# Patient Record
Sex: Female | Born: 1945 | Race: White | Hispanic: No | Smoking: Never smoker
Health system: Southern US, Community
[De-identification: ages and names within clinical notes are randomized; demographics above are authoritative.]

## PROBLEM LIST (undated history)

## (undated) DIAGNOSIS — N189 Chronic kidney disease, unspecified: Secondary | ICD-10-CM

## (undated) DIAGNOSIS — M1990 Unspecified osteoarthritis, unspecified site: Secondary | ICD-10-CM

## (undated) DIAGNOSIS — I10 Essential (primary) hypertension: Secondary | ICD-10-CM

## (undated) DIAGNOSIS — E785 Hyperlipidemia, unspecified: Secondary | ICD-10-CM

## (undated) DIAGNOSIS — H34232 Retinal artery branch occlusion, left eye: Secondary | ICD-10-CM

## (undated) DIAGNOSIS — R5383 Other fatigue: Secondary | ICD-10-CM

## (undated) DIAGNOSIS — J189 Pneumonia, unspecified organism: Secondary | ICD-10-CM

## (undated) DIAGNOSIS — E279 Disorder of adrenal gland, unspecified: Secondary | ICD-10-CM

## (undated) DIAGNOSIS — E875 Hyperkalemia: Secondary | ICD-10-CM

## (undated) DIAGNOSIS — I209 Angina pectoris, unspecified: Secondary | ICD-10-CM

## (undated) DIAGNOSIS — R7303 Prediabetes: Secondary | ICD-10-CM

## (undated) DIAGNOSIS — K4130 Unilateral femoral hernia, with obstruction, without gangrene, not specified as recurrent: Secondary | ICD-10-CM

## (undated) HISTORY — DX: Unilateral femoral hernia, with obstruction, without gangrene, not specified as recurrent: K41.30

## (undated) HISTORY — DX: Hyperlipidemia, unspecified: E78.5

---

## 1999-11-06 ENCOUNTER — Encounter: Admission: RE | Admit: 1999-11-06 | Discharge: 1999-11-06 | Payer: Self-pay | Admitting: Family Medicine

## 1999-11-06 ENCOUNTER — Encounter: Payer: Self-pay | Admitting: Family Medicine

## 2000-12-21 ENCOUNTER — Encounter: Admission: RE | Admit: 2000-12-21 | Discharge: 2000-12-21 | Payer: Self-pay | Admitting: Family Medicine

## 2000-12-21 ENCOUNTER — Encounter: Payer: Self-pay | Admitting: Family Medicine

## 2004-09-18 ENCOUNTER — Encounter: Admission: RE | Admit: 2004-09-18 | Discharge: 2004-09-18 | Payer: Self-pay | Admitting: Unknown Physician Specialty

## 2006-06-09 ENCOUNTER — Other Ambulatory Visit: Admission: RE | Admit: 2006-06-09 | Discharge: 2006-06-09 | Payer: Self-pay | Admitting: Family Medicine

## 2011-03-08 ENCOUNTER — Other Ambulatory Visit: Payer: Self-pay | Admitting: Family Medicine

## 2011-03-08 ENCOUNTER — Other Ambulatory Visit (HOSPITAL_COMMUNITY)
Admission: RE | Admit: 2011-03-08 | Discharge: 2011-03-08 | Disposition: A | Discharge status: Home / Self Care | Payer: BC Managed Care – PPO | Source: Ambulatory Visit | Attending: Family Medicine | Admitting: Family Medicine

## 2011-03-08 DIAGNOSIS — Z01419 Encounter for gynecological examination (general) (routine) without abnormal findings: Secondary | ICD-10-CM | POA: Insufficient documentation

## 2011-03-30 ENCOUNTER — Other Ambulatory Visit: Payer: Self-pay | Admitting: Family Medicine

## 2011-03-30 DIAGNOSIS — Z1231 Encounter for screening mammogram for malignant neoplasm of breast: Secondary | ICD-10-CM

## 2011-06-30 ENCOUNTER — Ambulatory Visit: Payer: Self-pay

## 2016-01-01 ENCOUNTER — Observation Stay
Admission: EM | Admit: 2016-01-01 | Discharge: 2016-01-02 | Disposition: A | Discharge status: Home / Self Care | Payer: Medicare Other | Attending: Internal Medicine | Admitting: Internal Medicine

## 2016-01-01 ENCOUNTER — Encounter: Payer: Self-pay | Admitting: *Deleted

## 2016-01-01 ENCOUNTER — Emergency Department: Payer: Medicare Other

## 2016-01-01 DIAGNOSIS — E871 Hypo-osmolality and hyponatremia: Secondary | ICD-10-CM | POA: Insufficient documentation

## 2016-01-01 DIAGNOSIS — Z7982 Long term (current) use of aspirin: Secondary | ICD-10-CM | POA: Insufficient documentation

## 2016-01-01 DIAGNOSIS — I10 Essential (primary) hypertension: Secondary | ICD-10-CM

## 2016-01-01 DIAGNOSIS — I200 Unstable angina: Secondary | ICD-10-CM

## 2016-01-01 DIAGNOSIS — R079 Chest pain, unspecified: Secondary | ICD-10-CM | POA: Diagnosis present

## 2016-01-01 DIAGNOSIS — R0789 Other chest pain: Principal | ICD-10-CM | POA: Diagnosis present

## 2016-01-01 DIAGNOSIS — Z8249 Family history of ischemic heart disease and other diseases of the circulatory system: Secondary | ICD-10-CM | POA: Insufficient documentation

## 2016-01-01 DIAGNOSIS — Z885 Allergy status to narcotic agent status: Secondary | ICD-10-CM | POA: Insufficient documentation

## 2016-01-01 DIAGNOSIS — I4510 Unspecified right bundle-branch block: Secondary | ICD-10-CM | POA: Insufficient documentation

## 2016-01-01 DIAGNOSIS — E782 Mixed hyperlipidemia: Secondary | ICD-10-CM | POA: Insufficient documentation

## 2016-01-01 DIAGNOSIS — R7989 Other specified abnormal findings of blood chemistry: Secondary | ICD-10-CM | POA: Diagnosis not present

## 2016-01-01 DIAGNOSIS — Z9119 Patient's noncompliance with other medical treatment and regimen: Secondary | ICD-10-CM | POA: Diagnosis not present

## 2016-01-01 HISTORY — DX: Essential (primary) hypertension: I10

## 2016-01-01 LAB — BASIC METABOLIC PANEL
Anion gap: 12 (ref 5–15)
BUN: 13 mg/dL (ref 6–20)
CALCIUM: 8.9 mg/dL (ref 8.9–10.3)
CO2: 21 mmol/L — AB (ref 22–32)
CREATININE: 0.82 mg/dL (ref 0.44–1.00)
Chloride: 101 mmol/L (ref 101–111)
GFR calc Af Amer: >60 mL/min (ref >60)
GFR calc non Af Amer: >60 mL/min (ref >60)
GLUCOSE: 159 mg/dL — AB (ref 65–99)
Potassium: 3.7 mmol/L (ref 3.5–5.1)
Sodium: 134 mmol/L — ABNORMAL LOW (ref 135–145)

## 2016-01-01 LAB — TROPONIN I
Troponin I: 0.43 ng/mL (ref <0.03)
Troponin I: 0.98 ng/mL (ref <0.03)

## 2016-01-01 LAB — CBC
HCT: 48.2 % — ABNORMAL HIGH (ref 35.0–47.0)
HEMOGLOBIN: 15.9 g/dL (ref 12.0–16.0)
MCH: 29.1 pg (ref 26.0–34.0)
MCHC: 33.1 g/dL (ref 32.0–36.0)
MCV: 88 fL (ref 80.0–100.0)
Platelets: 241 10*3/uL (ref 150–440)
RBC: 5.48 MIL/uL — ABNORMAL HIGH (ref 3.80–5.20)
RDW: 13.8 % (ref 11.5–14.5)
WBC: 7.5 10*3/uL (ref 3.6–11.0)

## 2016-01-01 LAB — TSH: TSH: 2.655 u[IU]/mL (ref 0.350–4.500)

## 2016-01-01 MED ORDER — ENOXAPARIN SODIUM 100 MG/ML ~~LOC~~ SOLN
1.0000 mg/kg | Freq: Two times a day (BID) | SUBCUTANEOUS | Status: DC
  Administered 2016-01-01 – 2016-01-02 (×3): 85 mg via SUBCUTANEOUS
  Filled 2016-01-01 (×3): qty 1

## 2016-01-01 MED ORDER — ASPIRIN EC 325 MG PO TBEC
325.0000 mg | DELAYED_RELEASE_TABLET | Freq: Every day | ORAL | Status: DC
  Administered 2016-01-02: 325 mg via ORAL
  Filled 2016-01-01 (×2): qty 1

## 2016-01-01 MED ORDER — NITROGLYCERIN 0.4 MG SL SUBL
0.4000 mg | SUBLINGUAL_TABLET | SUBLINGUAL | Status: DC | PRN
  Administered 2016-01-01: 0.4 mg via SUBLINGUAL
  Filled 2016-01-01: qty 1

## 2016-01-01 MED ORDER — MORPHINE SULFATE (PF) 4 MG/ML IV SOLN: 1.0000 mg | INTRAVENOUS | Status: DC | PRN

## 2016-01-01 MED ORDER — METOPROLOL TARTRATE 25 MG PO TABS
25.0000 mg | ORAL_TABLET | Freq: Two times a day (BID) | ORAL | Status: DC
  Administered 2016-01-01 – 2016-01-02 (×3): 25 mg via ORAL
  Filled 2016-01-01 (×3): qty 1

## 2016-01-01 MED ORDER — CARVEDILOL 6.25 MG PO TABS
3.1250 mg | ORAL_TABLET | Freq: Two times a day (BID) | ORAL | Status: DC
  Administered 2016-01-01 – 2016-01-02 (×2): 3.125 mg via ORAL
  Filled 2016-01-01 (×2): qty 1

## 2016-01-01 MED ORDER — ONDANSETRON HCL 4 MG/2ML IJ SOLN: 4.0000 mg | Freq: Four times a day (QID) | INTRAMUSCULAR | Status: DC | PRN

## 2016-01-01 MED ORDER — SODIUM CHLORIDE 0.9% FLUSH
3.0000 mL | Freq: Two times a day (BID) | INTRAVENOUS | Status: DC
  Administered 2016-01-01: 3 mL via INTRAVENOUS

## 2016-01-01 MED ORDER — LATANOPROST 0.005 % OP SOLN
1.0000 [drp] | Freq: Every day | OPHTHALMIC | Status: DC
  Administered 2016-01-01: 1 [drp] via OPHTHALMIC
  Filled 2016-01-01: qty 2.5

## 2016-01-01 MED ORDER — SODIUM CHLORIDE 0.9 % IV SOLN
INTRAVENOUS | Status: DC
  Administered 2016-01-01 – 2016-01-02 (×2): via INTRAVENOUS

## 2016-01-01 MED ORDER — ONDANSETRON HCL 4 MG PO TABS: 4.0000 mg | ORAL_TABLET | Freq: Four times a day (QID) | ORAL | Status: DC | PRN

## 2016-01-01 MED ORDER — SENNOSIDES-DOCUSATE SODIUM 8.6-50 MG PO TABS: 1.0000 | ORAL_TABLET | Freq: Every evening | ORAL | Status: DC | PRN

## 2016-01-01 MED ORDER — ACETAMINOPHEN 650 MG RE SUPP: 650.0000 mg | Freq: Four times a day (QID) | RECTAL | Status: DC | PRN

## 2016-01-01 MED ORDER — ACETAMINOPHEN 325 MG PO TABS: 650.0000 mg | ORAL_TABLET | Freq: Four times a day (QID) | ORAL | Status: DC | PRN

## 2016-01-01 MED ORDER — HYDRALAZINE HCL 20 MG/ML IJ SOLN: 10.0000 mg | Freq: Four times a day (QID) | INTRAMUSCULAR | Status: DC | PRN

## 2016-01-01 MED ORDER — HYDROCODONE-ACETAMINOPHEN 5-325 MG PO TABS: 1.0000 | ORAL_TABLET | ORAL | Status: DC | PRN

## 2016-01-01 MED ORDER — TIMOLOL MALEATE 0.5 % OP SOLN
1.0000 [drp] | Freq: Every day | OPHTHALMIC | Status: DC
  Administered 2016-01-02: 1 [drp] via OPHTHALMIC
  Filled 2016-01-01: qty 5

## 2016-01-01 MED ORDER — NITROGLYCERIN 2 % TD OINT
0.5000 [in_us] | TOPICAL_OINTMENT | Freq: Four times a day (QID) | TRANSDERMAL | Status: DC
  Administered 2016-01-01 – 2016-01-02 (×4): 0.5 [in_us] via TOPICAL
  Filled 2016-01-01 (×4): qty 1

## 2016-01-01 NOTE — H&P (Signed)
[facility] - [place] at [facility]   PATIENT NAME: Joanne CourserVirginia Lewis    MR#:  [id number]  DATE OF BIRTH:  03/31/1945  DATE OF ADMISSION:  01/01/2016  PRIMARY CARE PHYSICIAN: No primary care provider on file.   REQUESTING/REFERRING PHYSICIAN: Dr. Don PerkingVeronese  CHIEF COMPLAINT:   Chest tightness HISTORY OF PRESENT ILLNESS:  Joanne CourserVirginia Lewis  is a 70 y.o. female with a known history of Hypertension not taking medications for several years due to feeling fatigued on metoprolol who presents with above complaint. Patient reports over the past 2-3 days she has had episodes of chest tightness associated with heaviness in her arms and legs and shortness of breath. She had mild chest pressure this morning which brought her to the ER due to concern of coronary artery disease. Her father passed away at age 69 with a massive MI. She took an aspirin before coming to the emergency room.  PAST MEDICAL HISTORY:   Past Medical History:  Diagnosis Date  . Hypertension     PAST SURGICAL HISTORY:  None  SOCIAL HISTORY:   Social History  Substance Use Topics  . Smoking status: Never Smoker  . Smokeless tobacco: Never Used  . Alcohol use No    FAMILY HISTORY:  Father passed away with MI at age 69  DRUG ALLERGIES:   Allergies  Allergen Reactions  . Codeine Nausea And Vomiting    REVIEW OF SYSTEMS:   Review of Systems  Constitutional: Negative.  Negative for chills, fever and malaise/fatigue.  HENT: Negative.  Negative for ear discharge, ear pain, hearing loss, nosebleeds and sore throat.   Eyes: Negative.  Negative for blurred vision and pain.  Respiratory: Negative.  Negative for cough, hemoptysis, shortness of breath and wheezing.   Cardiovascular: Negative.  Negative for chest pain, palpitations and leg swelling.       Chest/upper neck tightness  Gastrointestinal: Negative.  Negative for abdominal pain, blood in stool, diarrhea, nausea and vomiting.  Genitourinary:  Negative.  Negative for dysuria.  Musculoskeletal: Negative.  Negative for back pain.  Skin: Negative.   Neurological: Negative for dizziness, tremors, speech change, focal weakness, seizures and headaches.  Endo/Heme/Allergies: Negative.  Does not bruise/bleed easily.  Psychiatric/Behavioral: Negative.  Negative for depression, hallucinations and suicidal ideas.    MEDICATIONS AT HOME:   Prior to Admission medications   Medication Sig Start Date End Date Taking? Authorizing Provider  latanoprost (XALATAN) 0.005 % ophthalmic solution Place 1 drop into both eyes at bedtime.   Yes Historical Provider, MD  timolol (BETIMOL) 0.5 % ophthalmic solution Place 1 drop into both eyes daily.   Yes Historical Provider, MD      VITAL SIGNS:  Blood pressure (!) 152/99, pulse 97, temperature 97.6 F (36.4 C), temperature source Oral, resp. rate 18, height 5\' 4"  (1.626 m), weight 84.4 kg (186 lb), SpO2 96 %.  PHYSICAL EXAMINATION:   Physical Exam  Constitutional: She is oriented to person, place, and time and well-developed, well-nourished, and in no distress. No distress.  HENT:  Head: Normocephalic.  Eyes: No scleral icterus.  Neck: Normal range of motion. Neck supple. No JVD present. No tracheal deviation present.  Cardiovascular: Normal rate, regular rhythm and normal heart sounds.  Exam reveals no gallop and no friction rub.   No murmur heard. Pulmonary/Chest: Effort normal and breath sounds normal. No respiratory distress. She has no wheezes. She has no rales. She exhibits no tenderness.  Abdominal: Soft. Bowel sounds are normal. She exhibits no distension and  no mass. There is no tenderness. There is no rebound and no guarding.  Musculoskeletal: Normal range of motion. She exhibits no edema.  Neurological: She is alert and oriented to person, place, and time.  Skin: Skin is warm. No rash noted. No erythema.  Psychiatric: Affect and judgment normal.      LABORATORY PANEL:    CBC  Recent Labs Lab 01/01/16 1141  WBC 7.5  HGB 15.9  HCT 48.2*  PLT 241   ------------------------------------------------------------------------------------------------------------------  Chemistries   Recent Labs Lab 01/01/16 1141  NA 134*  K 3.7  CL 101  CO2 21*  GLUCOSE 159*  BUN 13  CREATININE 0.82  CALCIUM 8.9   ------------------------------------------------------------------------------------------------------------------  Cardiac Enzymes  Recent Labs Lab 01/01/16 1141  TROPONINI <0.03   ------------------------------------------------------------------------------------------------------------------  RADIOLOGY:  Dg Chest 2 View  Result Date: 01/01/2016 CLINICAL DATA:  Chest tightness.  Hypertension EXAM: CHEST  2 VIEW COMPARISON:  None. FINDINGS: Lungs are clear. Heart size and pulmonary vascularity are normal. No adenopathy. No pneumothorax. No bone lesions. IMPRESSION: No edema or consolidation. Electronically Signed   By: Bretta BangWilliam  Woodruff III M.D.   On: 01/01/2016 12:02    EKG:   Sinus tachycardia without ST elevation or depression  IMPRESSION AND PLAN:   70 year old female with a history of hypertension not compliant with hypertensive medications who presents with chest tightness.  1. Chest tightness: Patient will be evaluated for ACS.  Continue monitoring troponins Continue telemetry If troponins 3 are negative then patient may proceed to treadmill stress test in a.m. Start aspirin and Coreg (which she may be able to tolerate better)  2. Accelerated essential hypertension due to noncompliance: Start Coreg and monitor blood pressure. Dosage of Coreg will likely need to be increased.  3. Hyperglycemia: Check hemoglobin A1c  4. Mild hyponatremia: Check TSH and start IV fluids with repeat BMP in a.m.     All the records are reviewed and case discussed with ED provider. Management plans discussed with the patient and she is in  agreement  CODE STATUS: FULL  TOTAL TIME TAKING CARE OF THIS PATIENT: 45 minutes.    Katrine Radich M.D on 01/01/2016 at 1:18 PM  Between 7am to 6pm - Pager - [phone]  After 6pm go to [email or web address] - Social research officer, governmentpassword EPAS [email or web address]  [facility]  Office  [phone]  CC: Primary care physician; No primary care provider on file.

## 2016-01-01 NOTE — ED Notes (Signed)
Patient left for xray.

## 2016-01-01 NOTE — Progress Notes (Signed)
ANTICOAGULATION CONSULT NOTE - Initial Consult  Pharmacy Consult for enoxaparin Indication: chest pain/ACS  Allergies  Allergen Reactions  . Codeine Nausea And Vomiting   Patient Measurements: Height: 5\' 4"  (162.6 cm) Weight: 186 lb (84.4 kg) IBW/kg (Calculated) : 54.7  Vital Signs: Temp: 97.6 F (36.4 C) (12/21 1135) Temp Source: Oral (12/21 1135) BP: 152/99 (12/21 1250) Pulse Rate: 97 (12/21 1250)  Labs:  Recent Labs  01/01/16 1141  HGB 15.9  HCT 48.2*  PLT 241  CREATININE 0.82  TROPONINI <0.03    Estimated Creatinine Clearance: 68.1 mL/min (by C-G formula based on SCr of 0.82 mg/dL).   Medical History: Past Medical History:  Diagnosis Date  . Hypertension    Assessment: Pharmacy consulted to dose enoxaparin treatment dosing in this 70 year old female for ACS.   Patient was not taking any anticoagulants prior to admission.  Goal of Therapy:  Monitor platelets by anticoagulation protocol: Yes   Plan:  Enoxaparin 1 mg/kg subcutaneously q12h  Cindi CarbonMary M Ahsha Hinsley, PharmD, BCPS Clinical Pharmacist 01/01/2016,1:22 PM

## 2016-01-01 NOTE — ED Triage Notes (Signed)
Per EMS report, Patient c/o throat/chest tightness today, feeling arms were heavy and pain in the Left leg. Patient has history of HTN and is non-compliant with meds. Patient states she became anxious and then became short of breath.

## 2016-01-01 NOTE — ED Provider Notes (Addendum)
[facility] Emergency Department Provider Note  ____________________________________________  Time seen: Approximately 12:23 PM  I have reviewed the triage vital signs and the nursing notes.   HISTORY  Chief Complaint Chest Pain   HPI Joanne Lewis is a 70 y.o. female with a history of hypertension and medication noncompliance who presents for evaluation of chest pain. Patient reports that she has been noncompliant with her metoprolol for 5 years and hasn't seen a physician for 5 years. For the last 2-3 days she reports episodes of chest tightness associated with heaviness of her arms and legs and mild shortness of breath. She denies nausea or vomiting, diaphoresis. She does have family history of ischemic heart disease and reports that her father died of a fulminant heart attack in his 7060s. Patient does not have a history of smoking. She reports the episode today was more pronounced which made her come into the emergency room. Her chest pain happens both at rest and with minimal exertion. At this time she denies mild chest tightness. No fever or cough. Patient took full dose of ASA before coming in  Past Medical History:  Diagnosis Date  . Hypertension     There are no active problems to display for this patient.   History reviewed. No pertinent surgical history.  Prior to Admission medications   Not on File    Allergies Codeine  No family history on file.  Social History Social History  Substance Use Topics  . Smoking status: Never Smoker  . Smokeless tobacco: Never Used  . Alcohol use No    Review of Systems  Constitutional: Negative for fever. Eyes: Negative for visual changes. ENT: Negative for sore throat. Neck: No neck pain  Cardiovascular: + chest pain. Respiratory: + shortness of breath. Gastrointestinal: Negative for abdominal pain, vomiting or diarrhea. Genitourinary: Negative for dysuria. Musculoskeletal: Negative for  back pain. Skin: Negative for rash. Neurological: Negative for headaches, weakness or numbness. Psych: No SI or HI  ____________________________________________   PHYSICAL EXAM:  VITAL SIGNS: ED Triage Vitals  Enc Vitals Group     BP 01/01/16 1135 (!) 203/110     Pulse Rate 01/01/16 1135 (!) 105     Resp 01/01/16 1135 14     Temp 01/01/16 1135 97.6 F (36.4 C)     Temp Source 01/01/16 1135 Oral     SpO2 01/01/16 1135 97 %     Weight 01/01/16 1137 186 lb (84.4 kg)     Height 01/01/16 1137 5\' 4"  (1.626 m)     Head Circumference --      Peak Flow --      Pain Score --      Pain Loc --      Pain Edu? --      Excl. in GC? --     Constitutional: Alert and oriented. Well appearing and in no apparent distress. HEENT:      Head: Normocephalic and atraumatic.         Eyes: Conjunctivae are normal. Sclera is non-icteric. EOMI. PERRL      Mouth/Throat: Mucous membranes are moist.       Neck: Supple with no signs of meningismus. Cardiovascular: Tachycardic with regular rhythm. No murmurs, gallops, or rubs. 2+ symmetrical distal pulses are present in all extremities. No JVD. Respiratory: Normal respiratory effort. Lungs are clear to auscultation bilaterally. No wheezes, crackles, or rhonchi.  Gastrointestinal: Soft, non tender, and non distended with positive bowel sounds. No rebound or guarding.  Musculoskeletal: Nontender with normal range of motion in all extremities. No edema, cyanosis, or erythema of extremities. Neurologic: Normal speech and language. Face is symmetric. Moving all extremities. No gross focal neurologic deficits are appreciated. Skin: Skin is warm, dry and intact. No rash noted. Psychiatric: Mood and affect are normal. Speech and behavior are normal.  ____________________________________________   LABS (all labs ordered are listed, but only abnormal results are displayed)  Labs Reviewed  BASIC METABOLIC PANEL - Abnormal; Notable for the following:        Result Value   Sodium 134 (*)    CO2 21 (*)    Glucose, Bld 159 (*)    All other components within normal limits  CBC - Abnormal; Notable for the following:    RBC 5.48 (*)    HCT 48.2 (*)    All other components within normal limits  TROPONIN I   ____________________________________________  EKG  ED ECG REPORT I, Nita Sicklearolina Ashlyne Olenick, the attending physician, personally viewed and interpreted this ECG.  Sinus tachycardia, rate of 110, right bundle branch block, prolonged QTC, normal axis, no ST elevations or depressions.  ____________________________________________  RADIOLOGY   CXR: Negative ____________________________________________   PROCEDURES  Procedure(s) performed: None Procedures Critical Care performed:  None ____________________________________________   INITIAL IMPRESSION / ASSESSMENT AND PLAN / ED COURSE   70 y.o. female with a history of hypertension and medication noncompliance who presents for evaluation of multiple episodes of chest tightness associated with shortness of breath both at rest and with exertion over the course of the last 3 days. Patient with extremely elevated blood pressure with systolics in the low 200s. She received one sublingual nitroglycerin and her blood pressure down to the 150s with resolution of her chest pain. Her first troponin is negative. Her EKG shows a right bundle-branch block with no evidence of ischemia with no old for comparison. I will re-start patient on metoprolol and admit for further management.  Clinical Course     Pertinent labs & imaging results that were available during my care of the patient were reviewed by me and considered in my medical decision making (see chart for details).    ____________________________________________   FINAL CLINICAL IMPRESSION(S) / ED DIAGNOSES  Final diagnoses:  Unstable angina (HCC)  Malignant hypertension      NEW MEDICATIONS STARTED DURING THIS VISIT:  New  Prescriptions   No medications on file     Note:  This document was prepared using Dragon voice recognition software and may include unintentional dictation errors.    Nita Sicklearolina Saleema Weppler, MD 01/01/16 1254    Nita Sicklearolina Terriana Barreras, MD 01/01/16 1256

## 2016-01-02 ENCOUNTER — Emergency Department: Payer: Medicare Other

## 2016-01-02 DIAGNOSIS — R0789 Other chest pain: Secondary | ICD-10-CM | POA: Diagnosis not present

## 2016-01-02 LAB — BASIC METABOLIC PANEL
ANION GAP: 8 (ref 5–15)
BUN: 13 mg/dL (ref 6–20)
CALCIUM: 8.3 mg/dL — AB (ref 8.9–10.3)
CHLORIDE: 100 mmol/L — AB (ref 101–111)
CO2: 24 mmol/L (ref 22–32)
Creatinine, Ser: 0.65 mg/dL (ref 0.44–1.00)
GFR calc non Af Amer: >60 mL/min (ref >60)
GLUCOSE: 106 mg/dL — AB (ref 65–99)
Potassium: 3.9 mmol/L (ref 3.5–5.1)
Sodium: 132 mmol/L — ABNORMAL LOW (ref 135–145)

## 2016-01-02 LAB — NM MYOCAR MULTI W/SPECT W/WALL MOTION / EF
CHL CUP MPHR: 151 {beats}/min
CHL CUP RESTING HR STRESS: 67 {beats}/min
CSEPED: 1 min
CSEPEDS: 1 s
CSEPEW: 1 METS
LV sys vol: 49 mL
LVDIAVOL: 88 mL (ref 46–106)
Peak HR: 116 {beats}/min
Percent HR: 76 %
SDS: 3
SRS: 5
SSS: 6
TID: 0.84

## 2016-01-02 LAB — HEMOGLOBIN A1C
Hgb A1c MFr Bld: 5.2 % (ref 4.8–5.6)
MEAN PLASMA GLUCOSE: 103 mg/dL

## 2016-01-02 LAB — CBC
HEMATOCRIT: 41.6 % (ref 35.0–47.0)
HEMOGLOBIN: 14.2 g/dL (ref 12.0–16.0)
MCH: 29.6 pg (ref 26.0–34.0)
MCHC: 34.1 g/dL (ref 32.0–36.0)
MCV: 87 fL (ref 80.0–100.0)
Platelets: 197 10*3/uL (ref 150–440)
RBC: 4.78 MIL/uL (ref 3.80–5.20)
RDW: 13.3 % (ref 11.5–14.5)
WBC: 8 10*3/uL (ref 3.6–11.0)

## 2016-01-02 LAB — LIPID PANEL
CHOL/HDL RATIO: 3.1 ratio
CHOLESTEROL: 174 mg/dL (ref 0–200)
HDL: 56 mg/dL (ref >40)
LDL Cholesterol: 106 mg/dL — ABNORMAL HIGH (ref 0–99)
TRIGLYCERIDES: 60 mg/dL (ref <150)
VLDL: 12 mg/dL (ref 0–40)

## 2016-01-02 LAB — GLUCOSE, CAPILLARY: Glucose-Capillary: 99 mg/dL (ref 65–99)

## 2016-01-02 LAB — TROPONIN I: Troponin I: 0.61 ng/mL (ref <0.03)

## 2016-01-02 MED ORDER — ATORVASTATIN CALCIUM 20 MG PO TABS: 40.0000 mg | ORAL_TABLET | Freq: Every day | ORAL | Status: DC

## 2016-01-02 MED ORDER — NITROGLYCERIN 0.4 MG SL SUBL: 0.4000 mg | SUBLINGUAL_TABLET | SUBLINGUAL | 0 refills | Status: DC | PRN

## 2016-01-02 MED ORDER — TECHNETIUM TC 99M TETROFOSMIN IV KIT
30.0000 | PACK | Freq: Once | INTRAVENOUS | Status: AC | PRN
  Administered 2016-01-02: 29.854 via INTRAVENOUS

## 2016-01-02 MED ORDER — TECHNETIUM TC 99M TETROFOSMIN IV KIT
13.0000 | PACK | Freq: Once | INTRAVENOUS | Status: AC | PRN
  Administered 2016-01-02: 13.58 via INTRAVENOUS

## 2016-01-02 MED ORDER — ASPIRIN 325 MG PO TBEC: 325.0000 mg | DELAYED_RELEASE_TABLET | Freq: Every day | ORAL | 2 refills | Status: DC

## 2016-01-02 MED ORDER — CARVEDILOL 3.125 MG PO TABS: 3.1250 mg | ORAL_TABLET | Freq: Two times a day (BID) | ORAL | 2 refills | Status: DC

## 2016-01-02 MED ORDER — REGADENOSON 0.4 MG/5ML IV SOLN
0.4000 mg | Freq: Once | INTRAVENOUS | Status: AC
  Administered 2016-01-02: 0.4 mg via INTRAVENOUS

## 2016-01-02 MED ORDER — ATORVASTATIN CALCIUM 40 MG PO TABS: 40.0000 mg | ORAL_TABLET | Freq: Every day | ORAL | 2 refills | Status: DC

## 2016-01-02 NOTE — Discharge Summary (Addendum)
[facility] - [place] at [facility]   PATIENT NAME: Joanne Lewis    MR#:  [id number]  DATE OF BIRTH:  06/24/1945  DATE OF ADMISSION:  01/01/2016   ADMITTING PHYSICIAN: Adrian SaranSital Mody, MD  DATE OF DISCHARGE: 01/02/2016 PRIMARY CARE PHYSICIAN: No primary care provider on file.   ADMISSION DIAGNOSIS:  Malignant hypertension [I10] Unstable angina (HCC) [I20.0] Chest pain [R07.9] DISCHARGE DIAGNOSIS:  Active Problems:   Chest tightness elevated tropoinin. HLP SECONDARY DIAGNOSIS:   Past Medical History:  Diagnosis Date  . Hypertension    HOSPITAL COURSE:  70 year old female with a history of hypertension not compliant with hypertensive medications who presents with chest tightness.  1. Chest tightness with elevated troponin. She has been treated with ASA and lovenox. Normal stress test. Carvedilol and lipitor. Possible discharged home if patient ambulating well without any further significant symptoms with follow-up next week per Dr. Gwen PoundsKowalski.  2. Accelerated essential hypertension due to noncompliance: BP is controlled. Continue Coreg per Dr. Gwen PoundsKowalski.  3. Hyperlipidemia. Start lipitor.  4. Mild hyponatremia:  normal TSH.  I discussed with Dr. Gwen PoundsKowalski. DISCHARGE CONDITIONS:  Stable, discharge to home today. CONSULTS OBTAINED:  Treatment Team:  Lamar BlinksBruce J Kowalski, MD DRUG ALLERGIES:   Allergies  Allergen Reactions  . Codeine Nausea And Vomiting   DISCHARGE MEDICATIONS:   Allergies as of 01/02/2016      Reactions   Codeine Nausea And Vomiting      Medication List    TAKE these medications   aspirin 325 MG EC tablet Take 1 tablet (325 mg total) by mouth daily. Start taking on:  01/03/2016   atorvastatin 40 MG tablet Commonly known as:  LIPITOR Take 1 tablet (40 mg total) by mouth daily at 6 PM.   carvedilol 3.125 MG tablet Commonly known as:  COREG Take 1 tablet (3.125 mg total) by mouth 2 (two) times daily with a meal.     latanoprost 0.005 % ophthalmic solution Commonly known as:  XALATAN Place 1 drop into both eyes at bedtime.   nitroGLYCERIN 0.4 MG SL tablet Commonly known as:  NITROSTAT Place 1 tablet (0.4 mg total) under the tongue every 5 (five) minutes as needed for chest pain.   timolol 0.5 % ophthalmic solution Commonly known as:  BETIMOL Place 1 drop into both eyes daily.        DISCHARGE INSTRUCTIONS:  See AVS.  If you experience worsening of your admission symptoms, develop shortness of breath, life threatening emergency, suicidal or homicidal thoughts you must seek medical attention immediately by calling 911 or calling your MD immediately  if symptoms less severe.  You Must read complete instructions/literature along with all the possible adverse reactions/side effects for all the Medicines you take and that have been prescribed to you. Take any new Medicines after you have completely understood and accpet all the possible adverse reactions/side effects.   Please note  You were cared for by a hospitalist during your hospital stay. If you have any questions about your discharge medications or the care you received while you were in the hospital after you are discharged, you can call the unit and asked to speak with the hospitalist on call if the hospitalist that took care of you is not available. Once you are discharged, your primary care physician will handle any further medical issues. Please note that NO REFILLS for any discharge medications will be authorized once you are discharged, as it is imperative that you return to your primary care  physician (or establish a relationship with a primary care physician if you do not have one) for your aftercare needs so that they can reassess your need for medications and monitor your lab values.    On the day of Discharge:  VITAL SIGNS:  Blood pressure (!) 147/73, pulse 61, temperature 97.9 F (36.6 C), temperature source Oral, resp. rate 18,  height 5\' 4"  (1.626 m), weight 185 lb (83.9 kg), SpO2 97 %. PHYSICAL EXAMINATION:  GENERAL:  70 y.o.-year-old patient lying in the bed with no acute distress.  EYES: Pupils equal, round, reactive to light and accommodation. No scleral icterus. Extraocular muscles intact.  HEENT: Head atraumatic, normocephalic. Oropharynx and nasopharynx clear.  NECK:  Supple, no jugular venous distention. No thyroid enlargement, no tenderness.  LUNGS: Normal breath sounds bilaterally, no wheezing, rales,rhonchi or crepitation. No use of accessory muscles of respiration.  CARDIOVASCULAR: S1, S2 normal. No murmurs, rubs, or gallops.  ABDOMEN: Soft, non-tender, non-distended. Bowel sounds present. No organomegaly or mass.  EXTREMITIES: No pedal edema, cyanosis, or clubbing.  NEUROLOGIC: Cranial nerves II through XII are intact. Muscle strength 5/5 in all extremities. Sensation intact. Gait not checked.  PSYCHIATRIC: The patient is alert and oriented x 3.  SKIN: No obvious rash, lesion, or ulcer.  DATA REVIEW:   CBC  Recent Labs Lab 01/02/16 0131  WBC 8.0  HGB 14.2  HCT 41.6  PLT 197    Chemistries   Recent Labs Lab 01/02/16 0131  NA 132*  K 3.9  CL 100*  CO2 24  GLUCOSE 106*  BUN 13  CREATININE 0.65  CALCIUM 8.3*     Microbiology Results  No results found for this or any previous visit.  RADIOLOGY:  Nm Myocar Multi W/spect W/wall Motion / Ef  Result Date: 01/02/2016  The study is normal.  This is a low risk study.  The left ventricular ejection fraction is normal (55-65%).  Blood pressure demonstrated a normal response to exercise.  There was no ST segment deviation noted during stress.      Management plans discussed with the patient, family and they are in agreement.  CODE STATUS:     Code Status Orders        Start     Ordered   01/01/16 1316  Full code  Continuous     01/01/16 1317    Code Status History    Date Active Date Inactive Code Status Order ID  Comments User Context   This patient has a current code status but no historical code status.      TOTAL TIME TAKING CARE OF THIS PATIENT: 36 minutes.    Shaune Pollackhen, Jenilee Franey M.D on 01/02/2016 at 4:15 PM  Between 7am to 6pm - Pager - [phone]  After 6pm go to [email or web address] - Scientist, research (life [email or web address])password EPAS [facility]  [facility] [place] [facility]  Office  [phone]  CC: Primary care physician; No primary care provider on file.   Note: This dictation was prepared with Dragon dictation along with smaller phrase technology. Any transcriptional errors that result from this process are unintentional.

## 2016-01-02 NOTE — Progress Notes (Signed)
ANTICOAGULATION CONSULT NOTE - Initial Consult  Pharmacy Consult for enoxaparin Indication: chest pain/ACS  Allergies  Allergen Reactions  . Codeine Nausea And Vomiting   Patient Measurements: Height: 5\' 4"  (162.6 cm) Weight: 185 lb (83.9 kg) IBW/kg (Calculated) : 54.7  Vital Signs: Temp: 98 F (36.7 C) (12/22 1116) Temp Source: Oral (12/22 1116) BP: 154/77 (12/22 1116) Pulse Rate: 76 (12/22 1116)  Labs:  Recent Labs  01/01/16 1141 01/01/16 1427 01/01/16 1917 01/02/16 0131  HGB 15.9  --   --  14.2  HCT 48.2*  --   --  41.6  PLT 241  --   --  197  CREATININE 0.82  --   --  0.65  TROPONINI <0.03 0.43* 0.98* 0.61*    Estimated Creatinine Clearance: 69.6 mL/min (by C-G formula based on SCr of 0.65 mg/dL).   Medical History: Past Medical History:  Diagnosis Date  . Hypertension    Assessment: Pharmacy consulted to dose enoxaparin treatment dosing in this 70 year old female for ACS.   Patient was not taking any anticoagulants prior to admission.  Goal of Therapy:  Monitor platelets by anticoagulation protocol: Yes   Plan:  Continue enoxaparin 1 mg/kg subcutaneously q12h  Martyn MalayBarefoot,Christyna Letendre C, PharmD, BCPS Clinical Pharmacist 01/02/2016,2:16 PM

## 2016-01-02 NOTE — Consult Note (Signed)
[facility] Consultation [facility]  Patient ID: Joanne PeerVirginia M Lewis, MRN: [id number], DOB/AGE: 69/09/1945 69 y.o. Admit date: 01/01/2016   Date of Consult: 01/02/2016 Primary Physician: No primary care provider on file. Primary Cardiologist: None  Chief Complaint:  Chief Complaint  Patient presents with  . Chest Pain   Reason for Consult: chest pain  HPI: 70 y.o. female with known essential hypertension and mixed hyperlipidemia who has been relatively noncompliant with her antihypertensive medication management. The patient has had some decrease exercise tolerance in recent months but recently has had some intermittent substernal chest discomfort radiating into her back associated with shortness of breath palpitations and fatigue. The patient had a significant recurrence of this and was seen in the emergency room. At that time the patient had an abnormal EKG showing normal sinus rhythm with left axis deviation and right bundle branch block. Additionally the patient had abnormal troponin of 0.98. Patient had resolution of her chest pain and underwent a stress test showing normal myocardial perfusion. The patient has had no further episodes of chest discomfort and currently is comfortable. There is concerns of cardiovascular disease but it appears based on information and current symptoms that the patient is stable for medical management  Past Medical History:  Diagnosis Date  . Hypertension       Surgical History: History reviewed. No pertinent surgical history.   Home Meds: Prior to Admission medications   Medication Sig Start Date End Date Taking? Authorizing Provider  latanoprost (XALATAN) 0.005 % ophthalmic solution Place 1 drop into both eyes at bedtime.   Yes Historical Provider, MD  timolol (BETIMOL) 0.5 % ophthalmic solution Place 1 drop into both eyes daily.   Yes Historical Provider, MD    Inpatient Medications:  . aspirin EC  325 mg Oral Daily  . carvedilol  3.125  mg Oral BID WC  . enoxaparin (LOVENOX) injection  1 mg/kg Subcutaneous BID  . latanoprost  1 drop Both Eyes QHS  . metoprolol tartrate  25 mg Oral BID  . nitroGLYCERIN  0.5 inch Topical Q6H  . sodium chloride flush  3 mL Intravenous Q12H  . timolol  1 drop Both Eyes Daily   . sodium chloride 75 mL/hr at 01/02/16 0600    Allergies:  Allergies  Allergen Reactions  . Codeine Nausea And Vomiting    Social History   Social History  . Marital status: Divorced    Spouse name: N/A  . Number of children: N/A  . Years of education: N/A   Occupational History  . Not on file.   Social History Main Topics  . Smoking status: Never Smoker  . Smokeless tobacco: Never Used  . Alcohol use No  . Drug use: No  . Sexual activity: Not on file   Other Topics Concern  . Not on file   Social History Narrative  . No narrative on file     No family history on file.   Review of Systems Positive for Chest pain shortness of breath Negative for: General:  chills, fever, night sweats or weight changes.  Cardiovascular: PND orthopnea syncope dizziness  Dermatological skin lesions rashes Respiratory: Cough congestion Urologic: Frequent urination urination at night and hematuria Abdominal: negative for nausea, vomiting, diarrhea, bright red blood per rectum, melena, or hematemesis Neurologic: negative for visual changes, and/or hearing changes  All other systems reviewed and are otherwise negative except as noted above.  Labs:  Recent Labs  01/01/16 1141 01/01/16 1427 01/01/16 1917 01/02/16 0131  TROPONINI <0.03 0.43* 0.98* 0.61*   Lab Results  Component Value Date   WBC 8.0 01/02/2016   HGB 14.2 01/02/2016   HCT 41.6 01/02/2016   MCV 87.0 01/02/2016   PLT 197 01/02/2016    Recent Labs Lab 01/02/16 0131  NA 132*  K 3.9  CL 100*  CO2 24  BUN 13  CREATININE 0.65  CALCIUM 8.3*  GLUCOSE 106*   Lab Results  Component Value Date   CHOL 174 01/02/2016   HDL 56  01/02/2016   LDLCALC 106 (H) 01/02/2016   TRIG 60 01/02/2016   No results found for: DDIMER  Radiology/Studies:  Dg Chest 2 View  Result Date: 01/01/2016 CLINICAL DATA:  Chest tightness.  Hypertension EXAM: CHEST  2 VIEW COMPARISON:  None. FINDINGS: Lungs are clear. Heart size and pulmonary vascularity are normal. No adenopathy. No pneumothorax. No bone lesions. IMPRESSION: No edema or consolidation. Electronically Signed   By: Bretta Bang III M.D.   On: 01/01/2016 12:02   Nm Myocar Multi W/spect W/wall Motion / Ef  Result Date: 01/02/2016  The study is normal.  This is a low risk study.  The left ventricular ejection fraction is normal (55-65%).  Blood pressure demonstrated a normal response to exercise.  There was no ST segment deviation noted during stress.     EKG: Normal sinus rhythm with left axis deviation and right bundle-branch block  Weights: Filed Weights   01/01/16 1137 01/01/16 1542  Weight: 84.4 kg (186 lb) 83.9 kg (185 lb)     Physical Exam: Blood pressure (!) 154/77, pulse 76, temperature 98 F (36.7 C), temperature source Oral, resp. rate 18, height 5\' 4"  (1.626 m), weight 83.9 kg (185 lb), SpO2 97 %. Body mass index is 31.76 kg/m. General: Well developed, well nourished, in no acute distress. Head eyes ears nose throat: Normocephalic, atraumatic, sclera non-icteric, no xanthomas, nares are without discharge. No apparent thyromegaly and/or mass  Lungs: Normal respiratory effort.  no wheezes, no rales, no rhonchi.  Heart: RRR with normal S1 S2. no murmur gallop, no rub, PMI is normal size and placement, carotid upstroke normal without bruit, jugular venous pressure is normal Abdomen: Soft, non-tender,  distended with normoactive bowel sounds. No hepatomegaly. No rebound/guarding. No obvious abdominal masses. Abdominal aorta is normal size without bruit Extremities: No edema. no cyanosis, no clubbing, no ulcers  Peripheral : 2+ bilateral upper extremity  pulses, 2+ bilateral femoral pulses, 2+ bilateral dorsal pedal pulse Neuro: Alert and oriented. No facial asymmetry. No focal deficit. Moves all extremities spontaneously. Musculoskeletal: Normal muscle tone without kyphosis Psych:  Responds to questions appropriately with a normal affect.    Assessment: 70 year old female with essential hypertension mixed hyperlipidemia having chest discomfort with abnormal EKG and minimal elevation of troponin but currently resolution of chest pain with a normal stress test needing further treatment  Plan: 1. Begin ambulation and follow for improvements of symptoms and no further assessment of the possibility of true angina requiring further cardiovascular intervention including the possibility of cardiac catheterization if symptoms do not resolve 2. Carvedilol for hypertension control and further risk reduction cardiovascular event 3. Aspirin for further risk reduction cardiovascular event 4. Abstain from metoprolol which caused patient significant symptoms and side effects 5. Isosorbide if necessary for chest discomfort 6. High intensity cholesterol therapy with atorvastatin due to significant family history in cardiovascular disease risk 7. Possible discharged home if patient ambulating well without any further significant symptoms with follow-up next week  Signed, Quentin Cornwall  Gwen PoundsKowalski M.D. [facility] 01/02/2016, 3:30 PM

## 2016-01-02 NOTE — Progress Notes (Signed)
Patient discharged per MD order and hospital protocol. Patient verbalized understanding of medications, discharge instructions, and follow up appointment. Patient was given her prescriptions for medications. Patient escorted off the unit via wheelchair.

## 2016-01-02 NOTE — Progress Notes (Signed)
Patient ambulated around the unit three times with nursing assistant. Patient's HR remained stable. Patient denied chest pain. Patient tolerated ambulation well.

## 2016-01-02 NOTE — Discharge Instructions (Signed)
Heart healthy diet

## 2016-01-10 ENCOUNTER — Encounter: Payer: Self-pay | Admitting: *Deleted

## 2016-01-10 ENCOUNTER — Emergency Department
Admission: EM | Admit: 2016-01-10 | Discharge: 2016-01-10 | Disposition: A | Discharge status: Home / Self Care | Payer: Medicare Other | Attending: Emergency Medicine | Admitting: Emergency Medicine

## 2016-01-10 DIAGNOSIS — I10 Essential (primary) hypertension: Secondary | ICD-10-CM | POA: Insufficient documentation

## 2016-01-10 DIAGNOSIS — R232 Flushing: Secondary | ICD-10-CM | POA: Insufficient documentation

## 2016-01-10 DIAGNOSIS — Z79899 Other long term (current) drug therapy: Secondary | ICD-10-CM | POA: Insufficient documentation

## 2016-01-10 DIAGNOSIS — R51 Headache: Secondary | ICD-10-CM | POA: Diagnosis present

## 2016-01-10 DIAGNOSIS — Z7982 Long term (current) use of aspirin: Secondary | ICD-10-CM | POA: Diagnosis not present

## 2016-01-10 DIAGNOSIS — R519 Headache, unspecified: Secondary | ICD-10-CM

## 2016-01-10 LAB — CBC
HCT: 44.4 % (ref 35.0–47.0)
Hemoglobin: 15.3 g/dL (ref 12.0–16.0)
MCH: 29.7 pg (ref 26.0–34.0)
MCHC: 34.4 g/dL (ref 32.0–36.0)
MCV: 86.6 fL (ref 80.0–100.0)
PLATELETS: 245 10*3/uL (ref 150–440)
RBC: 5.13 MIL/uL (ref 3.80–5.20)
RDW: 13.4 % (ref 11.5–14.5)
WBC: 8.1 10*3/uL (ref 3.6–11.0)

## 2016-01-10 LAB — BASIC METABOLIC PANEL
Anion gap: 8 (ref 5–15)
BUN: 17 mg/dL (ref 6–20)
CALCIUM: 9 mg/dL (ref 8.9–10.3)
CO2: 23 mmol/L (ref 22–32)
CREATININE: 0.85 mg/dL (ref 0.44–1.00)
Chloride: 102 mmol/L (ref 101–111)
GFR calc Af Amer: >60 mL/min (ref >60)
Glucose, Bld: 124 mg/dL — ABNORMAL HIGH (ref 65–99)
Potassium: 4.3 mmol/L (ref 3.5–5.1)
SODIUM: 133 mmol/L — AB (ref 135–145)

## 2016-01-10 LAB — TROPONIN I

## 2016-01-10 NOTE — ED Triage Notes (Addendum)
Pt arrives via EMs with complaints of head pressure, left arm tingling and ringing in her ears, states she was admitted Thursday for chest pain and had a stress test., pt took 1 nitro before arrival, states she was told she had a RBBB last visit

## 2016-01-10 NOTE — ED Provider Notes (Signed)
[facility] Emergency Department Provider Note   ____________________________________________   First MD Initiated Contact with Patient 01/10/16 1626     (approximate)  I have reviewed the triage vital signs and the nursing notes.   HISTORY  Chief Complaint Headache and Tingling    HPI Joanne Lewis is a 70 y.o. female reports that every day since leaving the hospital, mostly in the morning she is noticed the feeling of a tingling in both legs, a slight feeling of heaviness in both legs when getting up, and then she will begin to feel slightly flushed with a bandlike feeling around her head that will last sometimes a few hours. She also reports she felt a ringing in the ears this morning, reports she is felt the same feeling almost every day since leaving the hospital. Right now she reports that she feels well, she denies having had any symptoms for about the last hour and a half.  No chest pain, no focal weakness in arm or leg, no trouble speaking, no slurred speech, her headache has resolved and she describes as a very mild feeling of a bandlike feeling across the front of her head that she's had off-and-on for the last week and a half.  She's been compliant with her medications. She has a cardiology follow-up on Tuesday.   Past Medical History:  Diagnosis Date  . Hypertension     Patient Active Problem List   Diagnosis Date Noted  . Chest tightness 01/01/2016    History reviewed. No pertinent surgical history.  Prior to Admission medications   Medication Sig Start Date End Date Taking? Authorizing Provider  aspirin EC 325 MG EC tablet Take 1 tablet (325 mg total) by mouth daily. 01/03/16   Shaune PollackQing Chen, MD  atorvastatin (LIPITOR) 40 MG tablet Take 1 tablet (40 mg total) by mouth daily at 6 PM. 01/02/16   Shaune PollackQing Chen, MD  carvedilol (COREG) 3.125 MG tablet Take 1 tablet (3.125 mg total) by mouth 2 (two) times daily with a meal. 01/02/16   Shaune PollackQing  Chen, MD  latanoprost (XALATAN) 0.005 % ophthalmic solution Place 1 drop into both eyes at bedtime.    Historical Provider, MD  nitroGLYCERIN (NITROSTAT) 0.4 MG SL tablet Place 1 tablet (0.4 mg total) under the tongue every 5 (five) minutes as needed for chest pain. 01/02/16   Shaune PollackQing Chen, MD  timolol (BETIMOL) 0.5 % ophthalmic solution Place 1 drop into both eyes daily.    Historical Provider, MD    Allergies Codeine  History reviewed. No pertinent family history.  Social History Social History  Substance Use Topics  . Smoking status: Never Smoker  . Smokeless tobacco: Never Used  . Alcohol use No    Review of Systems Constitutional: No fever/chills Eyes: No visual changes. ENT: No sore throat. Cardiovascular: Denies chest pain. Respiratory: Denies shortness of breath. Gastrointestinal: No abdominal pain.  No nausea, no vomiting.  Genitourinary: Negative for dysuria. Musculoskeletal: Negative for back pain. Skin: Negative for rash. Neurological: Negative for headaches, focal weakness or numbness.  10-point ROS otherwise negative.  ____________________________________________   PHYSICAL EXAM:  VITAL SIGNS: ED Triage Vitals  Enc Vitals Group     BP 01/10/16 1306 (!) 169/79     Pulse Rate 01/10/16 1306 63     Resp 01/10/16 1306 18     Temp 01/10/16 1306 97.5 F (36.4 C)     Temp Source 01/10/16 1306 Oral     SpO2 01/10/16 1306 100 %  Weight 01/10/16 1307 185 lb (83.9 kg)     Height 01/10/16 1307 5\' 4"  (1.626 m)     Head Circumference --      Peak Flow --      Pain Score 01/10/16 1307 2     Pain Loc --      Pain Edu? --      Excl. in GC? --     Constitutional: Alert and oriented. Well appearing and in no acute distress. Eyes: Conjunctivae are normal. PERRL. EOMI. Head: Atraumatic. Nose: No congestion/rhinnorhea. Mouth/Throat: Mucous membranes are moist.  Oropharynx non-erythematous. Neck: No stridor.   Cardiovascular: Normal rate, regular rhythm. Grossly  normal heart sounds.  Good peripheral circulation. Respiratory: Normal respiratory effort.  No retractions. Lungs CTAB. Gastrointestinal: Soft and nontender. Musculoskeletal: No lower extremity tenderness nor edema.  No joint effusions. Neurologic:  Normal speech and language. No gross focal neurologic deficits are appreciated.  NIH score equals 0, performed by me at bedside. The patient has no pronator drift. The patient has normal cranial nerve exam. Extraocular movements are normal. Visual fields are normal. Patient has 5 out of 5 strength in all extremities. There is no numbness or gross, acute sensory abnormality in the extremities bilaterally. No speech disturbance. No dysarthria. No aphasia. No ataxia. Normal finger nose finger bilat. Patient speaking in full and clear sentences.   No gait instability. Skin:  Skin is warm, dry and intact. No rash noted. Psychiatric: Mood and affect are normal. Speech and behavior are normal.  ____________________________________________   LABS (all labs ordered are listed, but only abnormal results are displayed)  Labs Reviewed  BASIC METABOLIC PANEL - Abnormal; Notable for the following:       Result Value   Sodium 133 (*)    Glucose, Bld 124 (*)    All other components within normal limits  CBC  TROPONIN I   ____________________________________________  EKG  Reviewed and interpreted me at 1310 Heart rate 65 QRS 120 QTc 450 Right bundle-branch block, Q wave noted in lead 3, compared with the patient's previous EKG 01/01/16, no significant changes noted. ____________________________________________  RADIOLOGY  No results found.  ____________________________________________   PROCEDURES  Procedure(s) performed: None  Procedures  Critical Care performed: No  ____________________________________________   INITIAL IMPRESSION / ASSESSMENT AND PLAN / ED COURSE  Pertinent labs & imaging results that were available  during my care of the patient were reviewed by me and considered in my medical decision making (see chart for details).  Patient presents for evaluation of multiple symptoms, seems a episodic occurring daily since leaving the hospital. Reports she also is having similar symptoms before she entered the hospital, and thus does not attribute it to any medications she states now. She has no episode almost every day the last for an hour or 2 in the morning, and then seems to self resolved. Presently she has a very reassuring and normal neurologic exam, she has normal cardiovascular and pulmonary exam, stable vital signs with some hypertension though she reports she has not taken her evening medication today, and also when she's been checking her blood pressures at home they have been normal for the last week.  I discussed obtaining a CT of the head, though my suspicion for this being a neurologic event is extremely low, discussed with family and given her symptoms are resolved, occurred almost daily for the last week we will forego CT of the head as I talk to them about the risks and benefits of  CT, the reason for performing such as ruling out a tumor or stroke, and given shared medical decision making, I think is very reasonable to forego a CT of the head.  ----------------------------------------- 5:05 PM on 01/10/2016 -----------------------------------------  Patient feels well. Currently asymptomatic. She has a cardiology appointment on Tuesday. She'll follow-up with her doctor then, continued to journal her blood pressures throughout the day, and I discussed and she and her daughter both understand careful return precautions.  Return precautions and treatment recommendations and follow-up discussed with the patient who is agreeable with the plan.   Clinical Course      ____________________________________________   FINAL CLINICAL IMPRESSION(S) / ED DIAGNOSES  Final diagnoses:  Skin  blushing/flushing  Intermittent headache      NEW MEDICATIONS STARTED DURING THIS VISIT:  New Prescriptions   No medications on file     Note:  This document was prepared using Dragon voice recognition software and may include unintentional dictation errors.     Sharyn Creamer, MD 01/10/16 [phone]

## 2016-01-10 NOTE — Discharge Instructions (Signed)
Please journal your blood pressure readings twice a day while calm and seated, also journal the times and symptoms when you feel flushed or have headaches.  Call your doctor or return to the ED if you have a worsening headache, sudden and severe headache, confusion, slurred speech, facial droop, weakness or numbness in any arm or leg, extreme fatigue, vision problems, or other symptoms that concern you.

## 2016-02-03 ENCOUNTER — Ambulatory Visit (INDEPENDENT_AMBULATORY_CARE_PROVIDER_SITE_OTHER): Payer: Medicare Other | Admitting: Cardiovascular Disease

## 2016-02-03 ENCOUNTER — Encounter: Payer: Self-pay | Admitting: Cardiovascular Disease

## 2016-02-03 ENCOUNTER — Ambulatory Visit (INDEPENDENT_AMBULATORY_CARE_PROVIDER_SITE_OTHER)
Admission: RE | Admit: 2016-02-03 | Discharge: 2016-02-03 | Disposition: A | Discharge status: Home / Self Care | Payer: Self-pay | Source: Ambulatory Visit | Attending: Cardiovascular Disease | Admitting: Cardiovascular Disease

## 2016-02-03 VITALS — BP 162/94 | HR 58 | Ht 64.0 in | Wt 181.5 lb

## 2016-02-03 DIAGNOSIS — R748 Abnormal levels of other serum enzymes: Secondary | ICD-10-CM

## 2016-02-03 DIAGNOSIS — R079 Chest pain, unspecified: Secondary | ICD-10-CM

## 2016-02-03 DIAGNOSIS — F419 Anxiety disorder, unspecified: Secondary | ICD-10-CM | POA: Insufficient documentation

## 2016-02-03 DIAGNOSIS — R778 Other specified abnormalities of plasma proteins: Secondary | ICD-10-CM

## 2016-02-03 DIAGNOSIS — R7989 Other specified abnormal findings of blood chemistry: Secondary | ICD-10-CM

## 2016-02-03 DIAGNOSIS — I10 Essential (primary) hypertension: Secondary | ICD-10-CM | POA: Insufficient documentation

## 2016-02-03 DIAGNOSIS — Z136 Encounter for screening for cardiovascular disorders: Secondary | ICD-10-CM | POA: Diagnosis not present

## 2016-02-03 DIAGNOSIS — E782 Mixed hyperlipidemia: Secondary | ICD-10-CM

## 2016-02-03 NOTE — Patient Instructions (Addendum)
Medication Instructions:   Please decrease aspirin down 81 mg daily  Please monitor blood pressure at home Please call if numbers are elevated  Labwork:  No new labs needed  Testing/Procedures:  We will order a CT coronary calcium score for chest pain, elevated troponin Today @ 4:30, please arrive @ 4:15 There is a one-time fee of $150 due at the time of your procedure [address], [address], [place] [phone]  I recommend watching educational videos on topics of interest to you at:       [email or web address]  Enter code: [email or web address]    Follow-Up: It was a pleasure seeing you in the office today. Please call us if you have new issues that need to be addressed before your next appt.  [phone]  Your physician wants you to follow-up in:  please call with blood pressure numbers  If you need a refill on your cardiac medications before your next appointment, please call your pharmacy.    Coronary Calcium Scan A coronary calcium scan is an imaging test used to look for deposits of calcium and other fatty materials (plaques) in the inner lining of the blood vessels of your heart (coronary arteries). These deposits of calcium and plaques can partly clog and narrow the coronary arteries without producing any symptoms or warning signs. This puts you at risk for a heart attack. This test can detect these deposits before symptoms develop.  LET [facility] IncYOUR HEALTH CARE PROVIDER KNOW ABOUT:  Any allergies you have.  All medicines you are taking, including vitamins, herbs, eye drops, creams, and over-the-counter medicines.  Previous problems you or members of your family have had with the use of anesthetics.  Any blood disorders you have.  Previous surgeries you have had.  Medical conditions you have.  Possibility of pregnancy, if this applies. RISKS AND COMPLICATIONS Generally, this is a safe procedure. However, as with any procedure, complications can occur. This test involves  the use of radiation. Radiation exposure can be dangerous to a pregnant woman and her unborn baby. If you are pregnant, you should not have this procedure done.  BEFORE THE PROCEDURE There is no special preparation for the procedure. PROCEDURE  You will need to undress and put on a hospital gown. You will need to remove any jewelry around your neck or chest.  Sticky electrodes are placed on your chest and are connected to an electrocardiogram (EKG or electrocardiography) machine to recorda tracing of the electrical activity of your heart.  A CT scanner will take pictures of your heart. During this time, you will be asked to lie still and hold your breath for 2-3 seconds while a picture is being taken of your heart. AFTER THE PROCEDURE   You will be allowed to get dressed.  You can return to your normal activities after the scan is done. This information is not intended to replace advice given to you by your health care provider. Make sure you discuss any questions you have with your health care provider. Document Released: 06/26/2007 Document Revised: 01/02/2013 Document Reviewed: 09/04/2012 Elsevier Interactive Patient Education  2017 [employer]

## 2016-02-03 NOTE — Progress Notes (Signed)
Cardiology Office Note  Date:  02/03/2016   ID:  Joanne Lewis, DOB 08/20/1945, MRN [id number]  PCP:  Dr. Thedore MinsSingh  Chief Complaint  Patient presents with  . other     NP/SELF REFERRAL/ELEVATED BP, SEEN IN ED IN DECEMBER/SEEING DR Gwen PoundsKOWALSKI CURRENTLY/WANTS 2ND OPINION! Pt c/o being lightheaded, dizziness, and a lot of stress. Reviewed meds with pt verbally.    HPI:  71 year old woman with history of hypertension, hyperlipidemia, referred by Dr. Thedore MinsSingh for consultation of her shortness of breath. She was recently seen by outside cardiologist, would like second opinion  Loss of her son December 2012 Sees Dr. Azucena [facility]id [facility] yr ago Reports losing 50 pounds by eliminating soda and various drinks in 2016, was riding stationary bike  [facility] woke up out of sleep, unable to get up Eventually sx resolved  Seen in the hospital for chest pain 01/01/2016 (very stressed) Tightness in her head,buzzing in ears, throat tightening, hot and flush over chest, hot sensation, felt like she was going to pass out, pain left flank  Stress and the holiday season,  waves of chest pressure radiating to her throat at times Troponin elevation to 0.98  Stress test done in the hospital 01/02/2016 showing no ischemia, normal ejection fraction  Seen again in the hospital emergency room 01/10/2016, tingling in her legs Felt like panic attack was happening again, took NTG x 1 Called EMT CT head was obtained  Shortness of breath for the past 4 weeks, associated with walking, stairs, exercise relieved with rest. Some fatigue, irregular heartbeat  She had Holter monitor showing occasional PVCs, APCs, asymptomatic bradycardia normal sinus rhythm with bundle branch block with a maximum heart rate of 102 bpm minimum 43 beats minute average is 60 bpm.  Without holter had throat tightening,   Tolerating Lipitor She is only taking half dose of her carvedilol, did not feel well on the full dose  HB1C  5.2 Total chol 174, LDL 106, then started on lipitor 1/2 pill  Echocardiogram performed 01/23/2016 showing normal function ejection fraction 55%, mild AR, mild MR, borderline dilated left atrium,  BP at home 103 to 118 systolic 73 to 90 diastolic  EKG on today's visit shows normal sinus rhythm with rate 58 bpm, right bundle branch block, no significant ST or T-wave changes  Orthostatics done on her visit today, systolic pressure around 170 with no change from supine to standing position, heart rate did increase from 66 bpm up to 80 bpm with standing   PMH:   has a past medical history of Hyperlipidemia and Hypertension.  PSH:   History reviewed. No pertinent surgical history.  Current Outpatient Prescriptions  Medication Sig Dispense Refill  . atorvastatin (LIPITOR) 40 MG tablet Take 1 tablet (40 mg total) by mouth daily at 6 PM. (Patient taking differently: Take 20 mg by mouth daily at 6 PM. ) 30 tablet 2  . carvedilol (COREG) 3.125 MG tablet Take 1 tablet (3.125 mg total) by mouth 2 (two) times daily with a meal. (Patient taking differently: Take 3.125 mg by mouth 2 (two) times daily with a meal. ) 60 tablet 2  . latanoprost (XALATAN) 0.005 % ophthalmic solution Place 1 drop into both eyes at bedtime.    . nitroGLYCERIN (NITROSTAT) 0.4 MG SL tablet Place 1 tablet (0.4 mg total) under the tongue every 5 (five) minutes as needed for chest pain. 30 tablet 0  . timolol (BETIMOL) 0.5 % ophthalmic solution Place 1 drop into both  eyes daily.     No current facility-administered medications for this visit.      Allergies:   Codeine   Social History:  The patient  reports that she has never smoked. She has never used smokeless tobacco. She reports that she does not drink alcohol or use drugs.   Family History:   family history includes Heart attack in her father.  Age 89, died   Review of Systems: Review of Systems  Constitutional: Negative.   Respiratory: Negative.   Cardiovascular:  Positive for chest pain.  Gastrointestinal: Negative.   Musculoskeletal: Negative.   Neurological: Negative.   Psychiatric/Behavioral: The patient is nervous/anxious.   All other systems reviewed and are negative.    PHYSICAL EXAM: VS:  BP (!) 162/94 (BP Location: Right Leg, Patient Position: Sitting, Cuff Size: Normal)   Pulse (!) 58   Ht 5\' 4"  (1.626 m)   Wt 181 lb 8 oz (82.3 kg)   BMI 31.15 kg/m  , BMI Body mass index is 31.15 kg/m. GEN: Well nourished, well developed, in no acute distress  HEENT: normal  Neck: no JVD, carotid bruits, or masses Cardiac: RRR; no murmurs, rubs, or gallops,no edema  Respiratory:  clear to auscultation bilaterally, normal work of breathing GI: soft, nontender, nondistended, + BS MS: no deformity or atrophy  Skin: warm and dry, no rash Neuro:  Strength and sensation are intact Psych: euthymic mood, full affect    Recent Labs: 01/01/2016: TSH 2.655 01/10/2016: BUN 17; Creatinine, Ser 0.85; Hemoglobin 15.3; Platelets 245; Potassium 4.3; Sodium 133    Lipid Panel Lab Results  Component Value Date   CHOL 174 01/02/2016   HDL 56 01/02/2016   LDLCALC 106 (H) 01/02/2016   TRIG 60 01/02/2016      Wt Readings from Last 3 Encounters:  02/03/16 181 lb 8 oz (82.3 kg)  01/10/16 185 lb (83.9 kg)  01/01/16 185 lb (83.9 kg)       ASSESSMENT AND PLAN:  Screening for cardiovascular condition - Plan: EKG 12-Lead, CT CARDIAC SCORING Etiology of her recent chest pain symptoms and elevated troponin is unclear. Recent normal stress test, normal echocardiogram. CT coronary calcium score ordered for further risk stratification. History of hyperlipidemia, reported controlled blood pressure  Chest pain, unspecified type - Plan: CT CARDIAC SCORING Atypical in nature, often associated with severe anxiety  Elevated troponin - Plan: CT CARDIAC SCORING Suspect she may have had panic attack, debilitating enough to cause elevation of her troponin. CT  coronary calcium score pending to confirm negative stress test finding  Anxiety After long discussion with her today concerning recent events, I'm concerned that she may be having panic attacks. She may benefit from Xanax to take as needed in case she has recurrent symptoms. If CT coronary calcium score is very low, this would again suggest that she may be having anxiety  Essential hypertension Blood pressure markedly elevated today, perhaps mildly anxious She confirms having well controlled blood pressures at home, Recommended she continue to monitor blood pressure at home and call our office if numbers run high. She has follow-up with new primary care in 2 weeks time  Mixed hyperlipidemia Encouraged her to stay on her statin  long discussion with her today concerning recent symptoms, Hospital records reviewed, previous cardiology notes reviewed, testing reviewed with patient including echocardiogram, stress testing  Total encounter time more than 60 minutes  Greater than 50% was spent in counseling and coordination of care with the patient   Disposition:  Patient presented for second opinion, F/U  as needed We will refer her back to Dr. Gwen Pounds, her regular cardiologist, and to Dr. Thedore Mins, primary care physician   Orders Placed This Encounter  Procedures  . CT CARDIAC SCORING  . EKG 12-Lead     Signed, Dossie Arbour, M.D., Ph.D. 02/03/2016  [facility] [place], [place] [phone]

## 2016-02-04 ENCOUNTER — Telehealth: Payer: Self-pay | Admitting: Cardiovascular Disease

## 2016-02-04 NOTE — Telephone Encounter (Signed)
Pt calling stating she has been having some "sharp short " pains in left side of heart.  She states she usually has this dull pain but this one was a bit stronger. She is very worried

## 2016-02-04 NOTE — Telephone Encounter (Signed)
Spoke w/ pt.  She reports that around 12:15 today, she had brief chest pain that resolved on it's own.  Sx recurred around 2:30 this afternoon and resolved immediately. Sx recurred again at 3:30. Pt denies SOB, n/v, sweating and radiation of pain.  She reports that pain comes out of nowhere and she is not doing anything to bring it on.  Pt is very concerned that we believe her sx and mentions several times that she is not making up her symptoms and that she does not want to wear out her welcome. She would like to make sure that Dr. Mariah MillingGollan is her primary cardiologist and that only he will take care of her.  She also wants to verify that it is ok to take her meds although another MD prescribed them.  Reassured pt and and she is much calmer and appreciative at the end of our call.  She would like to know what Dr. Mariah MillingGollan recommends if sx recur.

## 2016-02-04 NOTE — Telephone Encounter (Signed)
We need the CT coronary calcium score That would let us know if symptoms are cardiac in etiology For now no changes to her medications

## 2016-02-05 NOTE — Telephone Encounter (Signed)
Notes Recorded by Stann MainlandJennifer O Clark, RN on 02/05/2016 at 1:25 PM EST No answer. Left message to call back on home phone number. Attempted cell phone number x2 and no VM or person picked up. ------  Notes Recorded by Antonieta Ibaimothy J Gollan, MD on 02/04/2016 at 9:31 PM EST CT coronary calcium score Score of 10,  This is very low, good news,  there is minimal coronary calcified plaque noted This suggests chest pain is not from blockage, this confirms finding seen on stress test. Would recommend talking with Dr. Thedore MinsSingh about any further testing that might need to be done [facility]

## 2016-02-05 NOTE — Telephone Encounter (Signed)
Patient returning our calls. Reviewed results and recommendations with patient and she was so very appreciative for the information and she stated that it helped put her mind to ease. She verbalized understanding of our conversation and results with no further questions at this time.

## 2016-02-18 ENCOUNTER — Other Ambulatory Visit: Payer: Self-pay | Admitting: Internal Medicine

## 2016-02-18 DIAGNOSIS — Z1239 Encounter for other screening for malignant neoplasm of breast: Secondary | ICD-10-CM

## 2016-03-05 ENCOUNTER — Telehealth: Payer: Self-pay | Admitting: Cardiovascular Disease

## 2016-03-05 NOTE — Telephone Encounter (Signed)
Pt calling stating she received a bill stating she owed about $400 But she called her insurance company they told her that we may have forgotten to put in a code stating this is just a "second opinion" visit.  We had coded this as a routine office visit. And for that reason they denied the coverage Please advise.

## 2016-03-16 ENCOUNTER — Ambulatory Visit
Admission: RE | Admit: 2016-03-16 | Discharge: 2016-03-16 | Disposition: A | Discharge status: Home / Self Care | Payer: Medicare Other | Source: Ambulatory Visit | Attending: Internal Medicine | Admitting: Internal Medicine

## 2016-03-16 ENCOUNTER — Encounter: Payer: Self-pay | Admitting: Radiology

## 2016-03-16 DIAGNOSIS — Z1239 Encounter for other screening for malignant neoplasm of breast: Secondary | ICD-10-CM

## 2016-03-16 DIAGNOSIS — Z1231 Encounter for screening mammogram for malignant neoplasm of breast: Secondary | ICD-10-CM | POA: Insufficient documentation

## 2016-03-18 ENCOUNTER — Other Ambulatory Visit: Payer: Self-pay | Admitting: Internal Medicine

## 2016-03-18 DIAGNOSIS — N6320 Unspecified lump in the left breast, unspecified quadrant: Secondary | ICD-10-CM

## 2016-03-18 DIAGNOSIS — N6310 Unspecified lump in the right breast, unspecified quadrant: Secondary | ICD-10-CM

## 2016-03-18 DIAGNOSIS — R928 Other abnormal and inconclusive findings on diagnostic imaging of breast: Secondary | ICD-10-CM

## 2016-03-25 ENCOUNTER — Other Ambulatory Visit: Payer: Self-pay | Admitting: Internal Medicine

## 2016-03-25 ENCOUNTER — Ambulatory Visit
Admission: RE | Admit: 2016-03-25 | Discharge: 2016-03-25 | Disposition: A | Discharge status: Home / Self Care | Payer: Medicare Other | Source: Ambulatory Visit | Attending: Internal Medicine | Admitting: Internal Medicine

## 2016-03-25 DIAGNOSIS — N6320 Unspecified lump in the left breast, unspecified quadrant: Secondary | ICD-10-CM

## 2016-03-25 DIAGNOSIS — N6310 Unspecified lump in the right breast, unspecified quadrant: Secondary | ICD-10-CM

## 2016-03-25 DIAGNOSIS — R928 Other abnormal and inconclusive findings on diagnostic imaging of breast: Secondary | ICD-10-CM

## 2016-08-06 ENCOUNTER — Encounter: Payer: Self-pay | Admitting: *Deleted

## 2016-08-09 ENCOUNTER — Ambulatory Visit
Admission: RE | Admit: 2016-08-09 | Discharge: 2016-08-09 | Disposition: A | Discharge status: Home / Self Care | Payer: Medicare Other | Source: Ambulatory Visit | Attending: Unknown Physician Specialty | Admitting: Unknown Physician Specialty

## 2016-08-09 ENCOUNTER — Encounter
Admission: RE | Disposition: A | Discharge status: Home / Self Care | Payer: Self-pay | Source: Ambulatory Visit | Attending: Unknown Physician Specialty

## 2016-08-09 ENCOUNTER — Encounter: Payer: Self-pay | Admitting: Emergency Medicine

## 2016-08-09 ENCOUNTER — Encounter: Payer: Self-pay | Admitting: Certified Registered Nurse Anesthetist

## 2016-08-09 ENCOUNTER — Ambulatory Visit: Payer: Medicare Other | Admitting: Certified Registered Nurse Anesthetist

## 2016-08-09 ENCOUNTER — Emergency Department
Admission: EM | Admit: 2016-08-09 | Discharge: 2016-08-09 | Disposition: A | Discharge status: Home / Self Care | Payer: Medicare Other | Source: Home / Self Care | Attending: Emergency Medicine | Admitting: Emergency Medicine

## 2016-08-09 ENCOUNTER — Other Ambulatory Visit: Payer: Self-pay

## 2016-08-09 DIAGNOSIS — Z8249 Family history of ischemic heart disease and other diseases of the circulatory system: Secondary | ICD-10-CM | POA: Insufficient documentation

## 2016-08-09 DIAGNOSIS — Z79899 Other long term (current) drug therapy: Secondary | ICD-10-CM | POA: Insufficient documentation

## 2016-08-09 DIAGNOSIS — I10 Essential (primary) hypertension: Secondary | ICD-10-CM

## 2016-08-09 DIAGNOSIS — E785 Hyperlipidemia, unspecified: Secondary | ICD-10-CM | POA: Insufficient documentation

## 2016-08-09 DIAGNOSIS — F419 Anxiety disorder, unspecified: Secondary | ICD-10-CM

## 2016-08-09 DIAGNOSIS — Z8489 Family history of other specified conditions: Secondary | ICD-10-CM | POA: Diagnosis not present

## 2016-08-09 DIAGNOSIS — I209 Angina pectoris, unspecified: Secondary | ICD-10-CM | POA: Diagnosis not present

## 2016-08-09 DIAGNOSIS — E669 Obesity, unspecified: Secondary | ICD-10-CM | POA: Insufficient documentation

## 2016-08-09 DIAGNOSIS — E875 Hyperkalemia: Secondary | ICD-10-CM | POA: Diagnosis not present

## 2016-08-09 DIAGNOSIS — Z885 Allergy status to narcotic agent status: Secondary | ICD-10-CM | POA: Diagnosis not present

## 2016-08-09 DIAGNOSIS — M1990 Unspecified osteoarthritis, unspecified site: Secondary | ICD-10-CM | POA: Diagnosis not present

## 2016-08-09 DIAGNOSIS — R5383 Other fatigue: Secondary | ICD-10-CM | POA: Diagnosis not present

## 2016-08-09 DIAGNOSIS — K640 First degree hemorrhoids: Secondary | ICD-10-CM | POA: Diagnosis not present

## 2016-08-09 DIAGNOSIS — Z1211 Encounter for screening for malignant neoplasm of colon: Secondary | ICD-10-CM | POA: Insufficient documentation

## 2016-08-09 HISTORY — DX: Hyperkalemia: E87.5

## 2016-08-09 HISTORY — PX: COLONOSCOPY WITH PROPOFOL: SHX5780

## 2016-08-09 HISTORY — DX: Unspecified osteoarthritis, unspecified site: M19.90

## 2016-08-09 HISTORY — DX: Other fatigue: R53.83

## 2016-08-09 HISTORY — DX: Angina pectoris, unspecified: I20.9

## 2016-08-09 SURGERY — COLONOSCOPY WITH PROPOFOL
Anesthesia: General

## 2016-08-09 MED ORDER — SODIUM CHLORIDE 0.9 % IV SOLN: INTRAVENOUS | Status: DC

## 2016-08-09 MED ORDER — SODIUM CHLORIDE 0.9 % IV SOLN
INTRAVENOUS | Status: DC
  Administered 2016-08-09: 09:00:00 via INTRAVENOUS

## 2016-08-09 MED ORDER — PROPOFOL 500 MG/50ML IV EMUL
INTRAVENOUS | Status: AC
  Filled 2016-08-09: qty 50

## 2016-08-09 MED ORDER — PROPOFOL 10 MG/ML IV BOLUS
INTRAVENOUS | Status: DC | PRN
  Administered 2016-08-09 (×2): 20 mg via INTRAVENOUS

## 2016-08-09 MED ORDER — PROPOFOL 500 MG/50ML IV EMUL
INTRAVENOUS | Status: DC | PRN
  Administered 2016-08-09: 150 ug/kg/min via INTRAVENOUS

## 2016-08-09 NOTE — Anesthesia Procedure Notes (Signed)
Date/Time: 08/09/2016 9:05 AM Performed by: Ginger CarneMICHELET, Reann Dobias Pre-anesthesia Checklist: Patient identified, Emergency Drugs available, Suction available, Patient being monitored and Timeout performed Patient Re-evaluated:Patient Re-evaluated prior to induction Oxygen Delivery Method: Nasal cannula

## 2016-08-09 NOTE — ED Provider Notes (Signed)
[facility] Emergency Department Provider Note   ____________________________________________   First MD Initiated Contact with Patient 08/09/16 [phone]     (approximate)  I have reviewed the triage vital signs and the nursing notes.   HISTORY  Chief Complaint Hypertension    HPI Joanne Lewis is a 71 y.o. female who comes into the hospital today with a high blood pressure. The patient reports last couple of times she's gone to the doctor's office her blood pressure was elevated. She reports that at home is typically normal. Today she's been prepping for a colonoscopy. Everything has been going well and she reports that this evening she had some clear appearing bowel movements. She reports though that 11:00 it was light brown with some small debris in it. The patient is been drinking a lot of water and became concerned that she wouldn't be cleaned out adequately to receive her colonoscopy in the morning. The patient reports that she became upset that she had gone throughout this prep for nothing and it would be canceled. She also worked herself up being concerned that she would throw off the physician schedule if she went in. The patient reports that she developed some tightness around her head so she decided to check her blood pressure. When she checked her blood pressure was in the 180s over 100s. The patient tried to lay down but she was very worked up and wanted to make sure that she didn't have a stroke. The patient did not take any medication since she is doing her colonoscopy prep was unsure if she was able to do so. The patient was also frustrated that she could not control her emotions. She denies any chest pain or dizziness. She reports that her headache was never severe and right now she just has it in her temples bilaterally and it's about a 1 or 2. The patient came in for evaluation.   Past Medical History:  Diagnosis Date  . Anginal pain (HCC)   .  Arthritis   . Fatigue   . Hyperkalemia   . Hyperlipidemia   . Hypertension     Patient Active Problem List   Diagnosis Date Noted  . Chest pain 02/03/2016  . Elevated troponin 02/03/2016  . Anxiety 02/03/2016  . Essential hypertension 02/03/2016  . Mixed hyperlipidemia 02/03/2016  . Chest tightness 01/01/2016    History reviewed. No pertinent surgical history.  Prior to Admission medications   Medication Sig Start Date End Date Taking? Authorizing Provider  amLODipine (NORVASC) 5 MG tablet Take 5 mg by mouth daily.    [provider]  atorvastatin (LIPITOR) 40 MG tablet Take 1 tablet (40 mg total) by mouth daily at 6 PM. Patient taking differently: Take 20 mg by mouth daily at 6 PM.  01/02/16   Shaune Pollackhen, Qing, MD  carvedilol (COREG) 3.125 MG tablet Take 1 tablet (3.125 mg total) by mouth 2 (two) times daily with a meal. Patient taking differently: Take 3.125 mg by mouth 2 (two) times daily with a meal.  01/02/16   Shaune Pollackhen, Qing, MD  latanoprost (XALATAN) 0.005 % ophthalmic solution Place 1 drop into both eyes at bedtime.    [provider]  nitroGLYCERIN (NITROSTAT) 0.4 MG SL tablet Place 1 tablet (0.4 mg total) under the tongue every 5 (five) minutes as needed for chest pain. 01/02/16   Shaune Pollackhen, Qing, MD  sodium polystyrene (KAYEXALATE) 15 GM/60ML suspension Take 15 g by mouth once.    [provider]  timolol (BETIMOL) 0.5 % ophthalmic solution Place 1 drop into both eyes daily.    [provider]    Allergies Codeine  Family History  Problem Relation Age of Onset  . Heart attack Father     Social History Social History  Substance Use Topics  . Smoking status: Never Smoker  . Smokeless tobacco: Never Used  . Alcohol use No    Review of Systems  Constitutional: No fever/chills Eyes: No visual changes. ENT: No sore throat. Cardiovascular: Denies chest pain. Respiratory: Denies shortness of breath. Gastrointestinal: No abdominal pain.   No nausea, no vomiting.  No diarrhea.  No constipation. Genitourinary: Negative for dysuria. Musculoskeletal: Negative for back pain. Skin: Negative for rash. Neurological: Headache   ____________________________________________   PHYSICAL EXAM:  VITAL SIGNS: ED Triage Vitals  Enc Vitals Group     BP 08/09/16 0112 (!) 163/94     Pulse Rate 08/09/16 0112 79     Resp 08/09/16 0112 18     Temp 08/09/16 0112 97.6 F (36.4 C)     Temp Source 08/09/16 0112 Oral     SpO2 08/09/16 0112 100 %     Weight 08/09/16 0114 200 lb (90.7 kg)     Height 08/09/16 0114 5\' 4"  (1.626 m)     Head Circumference --      Peak Flow --      Pain Score 08/09/16 0112 5     Pain Loc --      Pain Edu? --      Excl. in GC? --     Constitutional: Alert and oriented. Anxious appearing and in Mild distress. Eyes: Conjunctivae are normal. PERRL. EOMI. Head: Atraumatic. Nose: No congestion/rhinnorhea. Mouth/Throat: Mucous membranes are moist.  Oropharynx non-erythematous. Cardiovascular: Normal rate, regular rhythm. Grossly normal heart sounds.  Good peripheral circulation. Respiratory: Normal respiratory effort.  No retractions. Lungs CTAB. Gastrointestinal: Soft and nontender. No distention. Positive bowel sounds Musculoskeletal: No lower extremity tenderness nor edema.   Neurologic:  Normal speech and language. Cranial nerves II through XII are grossly intact with no focal motor or neuro deficits Skin:  Skin is warm, dry and intact.  Psychiatric: Mood and affect are normal.   ____________________________________________   LABS (all labs ordered are listed, but only abnormal results are displayed)  Labs Reviewed - No data to display ____________________________________________  EKG  ED ECG REPORT I, Rebecka Apley, the attending physician, personally viewed and interpreted this ECG.   Date: 08/09/2016  EKG Time: 127  Rate: 76  Rhythm: normal sinus rhythm  Axis: normal  Intervals:right  bundle branch block  ST&T Change: normal  ____________________________________________  RADIOLOGY  No results found.  ____________________________________________   PROCEDURES  Procedure(s) performed: None  Procedures  Critical Care performed: No  ____________________________________________   INITIAL IMPRESSION / ASSESSMENT AND PLAN / ED COURSE  Pertinent labs & imaging results that were available during my care of the patient were reviewed by me and considered in my medical decision making (see chart for details).  This is a 71 year old female who comes into the hospital today with some elevated blood pressure. The patient's blood pressure and headache started when she became very anxious that she would not be able to receive her colonoscopy. Looking back at the patient's office visit notes she often has a blood pressure that is in the 180s over 100s. The patient reports that that is only when she is at her doctor's office and at home it is usually lower.  It appears that the patient may have some whitecoat hypertension causing some of her symptoms. I explain to the patient that as long as her stool is liquid that she should still be able to receive her colonoscopy. I also informed her that stress and anxiety we'll also make her blood pressure go up. I offered to give the patient some Tylenol for her headache but she reports it it's a 1 or 2 and it is just bi temporal now. She states that it she doesn't feel that she needs anything for it. I did not check any other blood work as the patient had no chest pain no shortness of breath no dizziness or any other symptoms. I did allow the patient to rest and her repeat blood pressure was 154/63. She will be discharged to home to receive her colonoscopy in the morning.      ____________________________________________   FINAL CLINICAL IMPRESSION(S) / ED DIAGNOSES  Final diagnoses:  Hypertension, unspecified type  Anxiety       NEW MEDICATIONS STARTED DURING THIS VISIT:  Discharge Medication List as of 08/09/2016  3:17 AM       Note:  This document was prepared using Dragon voice recognition software and may include unintentional dictation errors.    Rebecka ApleyWebster, Chamya Hunton P, MD 08/09/16 [phone]

## 2016-08-09 NOTE — Anesthesia Preprocedure Evaluation (Signed)
Anesthesia Evaluation  Patient identified by MRN, date of birth, ID band Patient awake    Reviewed: Allergy & Precautions, NPO status , Patient's Chart, lab work & pertinent test results  History of Anesthesia Complications Negative for: history of anesthetic complications  Airway Mallampati: III  TM Distance: >3 FB Neck ROM: Full    Dental no notable dental hx.    Pulmonary neg pulmonary ROS, neg sleep apnea, neg COPD,    breath sounds clear to auscultation- rhonchi (-) wheezing      Cardiovascular hypertension, Pt. on medications (-) CAD, (-) Past MI and (-) Cardiac Stents  Rhythm:Regular Rate:Normal - Systolic murmurs and - Diastolic murmurs    Neuro/Psych Anxiety negative neurological ROS     GI/Hepatic negative GI ROS, Neg liver ROS,   Endo/Other  negative endocrine ROSneg diabetes  Renal/GU negative Renal ROS     Musculoskeletal  (+) Arthritis ,   Abdominal (+) + obese,   Peds  Hematology negative hematology ROS (+)   Anesthesia Other Findings Past Medical History: No date: Anginal pain (HCC) No date: Arthritis No date: Fatigue No date: Hyperkalemia No date: Hyperlipidemia No date: Hypertension   Reproductive/Obstetrics                             Anesthesia Physical Anesthesia Plan  ASA: II  Anesthesia Plan: General   Post-op Pain Management:    Induction: Intravenous  PONV Risk Score and Plan: 2 and Propofol infusion  Airway Management Planned: Natural Airway  Additional Equipment:   Intra-op Plan:   Post-operative Plan:   Informed Consent: I have reviewed the patients History and Physical, chart, labs and discussed the procedure including the risks, benefits and alternatives for the proposed anesthesia with the patient or authorized representative who has indicated his/her understanding and acceptance.   Dental advisory given  Plan Discussed with: CRNA and  Anesthesiologist  Anesthesia Plan Comments:         Anesthesia Quick Evaluation

## 2016-08-09 NOTE — Discharge Instructions (Signed)
I feel that your blood pressure became elevated due to the stress of not being able to receive your colonoscopy. At this time it appears after blood pressure is improving. I will discharge you to home to you may get some sleep before your procedure and so that you may take your Blood pressure medicine in the morning. Please follow up with your primary care physician and please return with any worsened condition on any concerns.

## 2016-08-09 NOTE — Anesthesia Postprocedure Evaluation (Signed)
Anesthesia Post Note  Patient: Alyda M Gfeller  Procedure(s) Performed: Procedure(s) (LRB): COLONOSCOPY WITH PROPOFOL (N/A)  Patient location during evaluation: Endoscopy Anesthesia Type: General Level of consciousness: awake and alert and oriented Pain management: pain level controlled Vital Signs Assessment: post-procedure vital signs reviewed and stable Respiratory status: spontaneous breathing, nonlabored ventilation and respiratory function stable Cardiovascular status: blood pressure returned to baseline and stable Postop Assessment: no signs of nausea or vomiting Anesthetic complications: no     Last Vitals:  Vitals:   08/09/16 0938 08/09/16 0948  BP: 119/68 130/65  Pulse: 60 (!) 56  Resp: 13 14  Temp:      Last Pain:  Vitals:   08/09/16 0928  TempSrc: Tympanic                 Levie Wages

## 2016-08-09 NOTE — Anesthesia Post-op Follow-up Note (Cosign Needed)
Anesthesia QCDR form completed.        

## 2016-08-09 NOTE — Transfer of Care (Signed)
Immediate Anesthesia Transfer of Care Note  Patient: Joanne Lewis  Procedure(s) Performed: Procedure(s): COLONOSCOPY WITH PROPOFOL (N/A)  Patient Location: PACU  Anesthesia Type:General  Level of Consciousness: sedated  Airway & Oxygen Therapy: Patient Spontanous Breathing and Patient connected to nasal cannula oxygen  Post-op Assessment: Report given to RN and Post -op Vital signs reviewed and stable  Post vital signs: Reviewed and stable  Last Vitals:  Vitals:   08/09/16 0833 08/09/16 0928  BP: (!) 158/81 97/83  Pulse: 65 (!) 53  Resp: 14 14  Temp: (!) 36.3 C (!) 35.6 C    Last Pain:  Vitals:   08/09/16 0928  TempSrc: Tympanic         Complications: No apparent anesthesia complications

## 2016-08-09 NOTE — ED Triage Notes (Signed)
Patient states that she has problems with her blood pressure shooting up. Patient states that tonight that she was drinking prep for a colonoscopy in the morning. Patient states that she started to become stressed about the prep and that her blood pressure became elevated. Patient reports that her blood pressure was 180's/100.

## 2016-08-09 NOTE — H&P (Signed)
   Primary Care Physician:  Leotis ShamesSingh, Jasmine, MD Primary Gastroenterologist:  Dr. Mechele CollinElliott  Pre-Procedure History & Physical: HPI:  Joanne Lewis is a 71 y.o. female is here for an colonoscopy.   Past Medical History:  Diagnosis Date  . Anginal pain (HCC)   . Arthritis   . Fatigue   . Hyperkalemia   . Hyperlipidemia   . Hypertension     History reviewed. No pertinent surgical history.  Prior to Admission medications   Medication Sig Start Date End Date Taking? Authorizing Provider  amLODipine (NORVASC) 5 MG tablet Take 5 mg by mouth daily.   Yes [provider]  atorvastatin (LIPITOR) 40 MG tablet Take 1 tablet (40 mg total) by mouth daily at 6 PM. Patient taking differently: Take 20 mg by mouth daily at 6 PM.  01/02/16  Yes Shaune Pollackhen, Qing, MD  carvedilol (COREG) 3.125 MG tablet Take 1 tablet (3.125 mg total) by mouth 2 (two) times daily with a meal. Patient taking differently: Take 3.125 mg by mouth 2 (two) times daily with a meal.  01/02/16  Yes Shaune Pollackhen, Qing, MD  latanoprost (XALATAN) 0.005 % ophthalmic solution Place 1 drop into both eyes at bedtime.   Yes [provider]  timolol (BETIMOL) 0.5 % ophthalmic solution Place 1 drop into both eyes daily.   Yes [provider]  nitroGLYCERIN (NITROSTAT) 0.4 MG SL tablet Place 1 tablet (0.4 mg total) under the tongue every 5 (five) minutes as needed for chest pain. 01/02/16   Shaune Pollackhen, Qing, MD    Allergies as of 05/11/2016 - Review Complete 02/03/2016  Allergen Reaction Noted  . Codeine Nausea And Vomiting 01/01/2016    Family History  Problem Relation Age of Onset  . Heart attack Father     Social History   Social History  . Marital status: Divorced    Spouse name: N/A  . Number of children: N/A  . Years of education: N/A   Occupational History  . Not on file.   Social History Main Topics  . Smoking status: Never Smoker  . Smokeless tobacco: Never Used  . Alcohol use No  . Drug use: No  .  Sexual activity: Not on file   Other Topics Concern  . Not on file   Social History Narrative  . No narrative on file    Review of Systems: See HPI, otherwise negative ROS  Physical Exam: BP (!) 158/81   Pulse 65   Temp (!) 97.3 F (36.3 C) (Tympanic)   Resp 14   SpO2 100%  General:   Alert,  pleasant and cooperative in NAD Head:  Normocephalic and atraumatic. Neck:  Supple; no masses or thyromegaly. Lungs:  Clear throughout to auscultation.    Heart:  Regular rate and rhythm. Abdomen:  Soft, nontender and nondistended. Normal bowel sounds, without guarding, and without rebound.   Neurologic:  Alert and  oriented x4;  grossly normal neurologically.  Impression/Plan: Joanne Lewis is here for an colonoscopy to be performed for screening colonoscopy  Risks, benefits, limitations, and alternatives regarding  colonoscopy have been reviewed with the patient.  Questions have been answered.  All parties agreeable.   Lynnae PrudeELLIOTT, ROBERT, MD  08/09/2016, 9:02 AM

## 2016-08-09 NOTE — ED Notes (Signed)
Patient reports she was taking the colonoscopy prep, realized that she may not be able to have colonoscopy as she was not having clear stools and became anxious.  Pt reports she began to have a headache and took her BP. Her BP at 190/100.

## 2016-08-09 NOTE — Op Note (Signed)
[facility] Gastroenterology [facility] Procedure Date: 08/09/2016 8:55 AM MRN: [id number] Account #: [id number] Date of Birth: 10/07/1945 Admit Type: Outpatient Age: [age] [facility] END[facility]OM 3 Gender: Female Note Status: Finalized Procedure:            Colonoscopy Indications:          Screening for colorectal malignant neoplasm Providers:            Scot Junobert T. Latiesha Harada, MD Referring MD:         Leotis ShamesJasmine Singh (Referring MD) Medicines:            Propofol per Anesthesia Complications:        No immediate complications. Procedure:            Pre-Anesthesia Assessment:                       - After reviewing the risks and benefits, the patient                        was deemed in satisfactory condition to undergo the                        procedure.                       After obtaining informed consent, the colonoscope was                        passed under direct vision. Throughout the procedure,                        the patient's blood pressure, pulse, and oxygen                        saturations were monitored continuously. The                        Colonoscope was introduced through the anus and                        advanced to the the cecum, identified by appendiceal                        orifice and ileocecal valve. The colonoscopy was                        performed without difficulty. The patient tolerated the                        procedure well. The quality of the bowel preparation                        was excellent. Findings:      Internal hemorrhoids were found during endoscopy. The hemorrhoids were       small and Grade I (internal hemorrhoids that do not prolapse).      The exam was otherwise without abnormality. Entire colonoscopy was       normal. Impression:           - Internal hemorrhoids.                       -  The examination was otherwise normal.                       - No specimens collected. Recommendation:        - Repeat colonoscopy in 10 years for screening                        purposes. Or not at all. Scot Junobert T Mattye Verdone, MD 08/09/2016 9:26:39 AM This report has been signed electronically. Number of Addenda: 0 Note Initiated On: 08/09/2016 8:55 AM Scope Withdrawal Time: 0 hours 7 minutes 39 seconds  Total Procedure Duration: 0 hours 15 minutes 17 seconds       [facility]

## 2016-08-09 NOTE — ED Notes (Signed)
Reviewed d/c instructions, follow-up care with patient. Pt verbalized understanding.  

## 2016-08-10 ENCOUNTER — Encounter: Payer: Self-pay | Admitting: Unknown Physician Specialty

## 2016-09-17 ENCOUNTER — Telehealth: Payer: Self-pay | Admitting: Cardiovascular Disease

## 2016-09-17 NOTE — Telephone Encounter (Signed)
Pt states last night she was not feeling well, just feeling slugish and could not sleep. BP was 135/80ish. HR was "fine". Has had excessive weight gain over the past year, states she has not been eating right, some dizziness also under a lot of stress.  Pt is seeing Dr. Mariah MillingGollan on 9/10.

## 2016-09-17 NOTE — Telephone Encounter (Signed)
Returned call to patient. Patient has been under a large amount of stress over the past several months and has gained a lot of weight over a short time from over-eating. She lost a lot of weight last year and is very frustrated with her self for gaining so much back. Recently, she's has 2 episodes in the morning upon waking. She wakes up with her legs feeling heavy, and a burning sensation under her skin of both arms and her left lower leg. She got dizzy upon "jumping up" out of bed one time. On episode occurred the morning after working out in her yard for hours in the hot weather the day before.  She admits she had a lot of salt and after drinking plenty of water she felt better. He BP was 168/?, HR 62 initially and decreased to 135/?, HR 60's. Patient did not know the diastolic number. Denies chest pain, shortness of breath or palpitations. She's also felt her nasal cavity to be stuffy over the past week in the morning. Patient has appointment with Dr Mariah MillingGollan on Monday, 09/20/16. Pt verbalized understanding to call 911 or go to the emergency room, if he develops any new or worsening symptoms.

## 2016-09-19 DIAGNOSIS — I2584 Coronary atherosclerosis due to calcified coronary lesion: Secondary | ICD-10-CM | POA: Insufficient documentation

## 2016-09-19 DIAGNOSIS — I2510 Atherosclerotic heart disease of native coronary artery without angina pectoris: Secondary | ICD-10-CM | POA: Insufficient documentation

## 2016-09-19 NOTE — Progress Notes (Signed)
Cardiology Office Note  Date:  09/20/2016   ID:  Joanne Lewis, Joanne Lewis Jan 08, 1946, MRN [id number]  PCP:  Dr. Thedore Mins  Chief Complaint  Patient presents with  . other    Pt. c/o weight gain, under stress and dizziness. Meds reviewed by the pt. verbally.     HPI:  71 year old woman with history of Anxiety/stress hypertension,  Hyperlipidemia shortness of breath.  Recent normal stress test, normal echocardiogram. Coronary calcium score of 10.  seen by outside cardiologist, would like second opinion  Lightheaded, BP elevated, 3 times at home Went to ER 08/09/2016 200 systolic Waited in Er, came down   She is concerned as blood pressure will spike sometimes with dizziness, tingly fingers In general blood pressure has been running well at home Elevated on initial check, improved on repeat down to 150/90  Weight up 25 pounds, eating the wrong foods over the past 6 months  Lipitor 20 mg daily (takes 1/2 40 mg), no side effects  Total chol 139, LDL 75 Total cho 174 before medication HBA1C 5.4  Taking coreg 1/2 BID  EKG personally reviewed by myself on todays visit Shows normal sinus rhythm rate 61 bpm right bundle branch block  Other past medical history reviewed Loss of her son December 2012 Sees Dr. Azucena Kuba with [facility] 5 yr ago Reports losing 50 pounds by  eliminating soda and various drinks in 2016, was riding stationary bike  At the [facility] 05/2014, woke up out of sleep, unable to get up Eventually sx resolved  Seen in the hospital for chest pain 01/01/2016 (very stressed) Tightness in her head,buzzing in ears, throat tightening, hot and flush over chest, hot sensation, felt like she was going to pass out, pain left flank  Stress and the holiday season,  waves of chest pressure radiating to her throat at times Troponin elevation to 0.98  Stress test done in the hospital 01/02/2016 showing no ischemia, normal ejection fraction  Seen again in the hospital emergency room  01/10/2016, tingling in her legs Felt like panic attack was happening again, took NTG x 1 Called EMT CT head was obtained  Shortness of breath for the past 4 weeks, associated with walking, stairs, exercise relieved with rest. Some fatigue, irregular heartbeat  She had Holter monitor showing occasional PVCs, APCs, asymptomatic bradycardia normal sinus rhythm with bundle branch block with a maximum heart rate of 102 bpm minimum 43 beats minute average is 60 bpm.  Without holter had throat tightening,   Tolerating Lipitor She is only taking half dose of her carvedilol, did not feel well on the full dose  HB1C 5.2 Total chol 174, LDL 106, then started on lipitor 1/2 pill  Echocardiogram performed 01/23/2016 showing normal function ejection fraction 55%, mild AR, mild MR, borderline dilated left atrium,  Orthostatics done on her visit today, systolic pressure around 170 with no change from supine to standing position, heart rate did increase from 66 bpm up to 80 bpm with standing   PMH:   has a past medical history of Anginal pain (HCC); Arthritis; Fatigue; Hyperkalemia; Hyperlipidemia; and Hypertension.  PSH:    Past Surgical History:  Procedure Laterality Date  . COLONOSCOPY WITH PROPOFOL N/A 08/09/2016   Procedure: COLONOSCOPY WITH PROPOFOL;  Surgeon: Scot Jun, MD;  Location: [facility] ENDOSCOPY;  Service: Endoscopy;  Laterality: N/A;    Current Outpatient Prescriptions  Medication Sig Dispense Refill  . amLODipine (NORVASC) 5 MG tablet Take 5 mg by mouth daily.    Marland Kitchen aspirin  EC 81 MG tablet Take 81 mg by mouth daily.     Marland Kitchen atorvastatin (LIPITOR) 40 MG tablet Take 1 tablet (40 mg total) by mouth daily at 6 PM. (Patient taking differently: Take 20 mg by mouth daily at 6 PM. ) 30 tablet 2  . carvedilol (COREG) 3.125 MG tablet Take 1 tablet (3.125 mg total) by mouth 2 (two) times daily with a meal. 60 tablet 2  . latanoprost (XALATAN) 0.005 % ophthalmic solution Place 1 drop into  both eyes at bedtime.    . nitroGLYCERIN (NITROSTAT) 0.4 MG SL tablet Place 1 tablet (0.4 mg total) under the tongue every 5 (five) minutes as needed for chest pain. 30 tablet 0  . timolol (BETIMOL) 0.5 % ophthalmic solution Place 1 drop into both eyes daily.     No current facility-administered medications for this visit.      Allergies:   Codeine   Social History:  The patient  reports that she has never smoked. She has never used smokeless tobacco. She reports that she does not drink alcohol or use drugs.   Family History:   family history includes Heart attack in her father.  Age 83, died   Review of Systems: Review of Systems  Constitutional: Negative.   Respiratory: Negative.   Cardiovascular: Negative.   Gastrointestinal: Negative.   Musculoskeletal: Negative.   Neurological: Positive for dizziness.  Psychiatric/Behavioral: The patient is nervous/anxious.   All other systems reviewed and are negative.    PHYSICAL EXAM: VS:  BP (!) 170/110 (BP Location: Left Arm, Patient Position: Sitting, Cuff Size: Normal)   Pulse 61   Ht  (1.626 m)   Wt 205 lb 12 oz (93.3 kg)   BMI 35.32 kg/m  , BMI Body mass index is 35.32 kg/m. GEN: Well nourished, well developed, in no acute distress  HEENT: normal  Neck: no JVD, carotid bruits, or masses Cardiac: RRR; no murmurs, rubs, or gallops,no edema  Respiratory:  clear to auscultation bilaterally, normal work of breathing GI: soft, nontender, nondistended, + BS MS: no deformity or atrophy  Skin: warm and dry, no rash Neuro:  Strength and sensation are intact Psych: euthymic mood, full affect    Recent Labs: 01/01/2016: TSH 2.655 01/10/2016: BUN 17; Creatinine, Ser 0.85; Hemoglobin 15.3; Platelets 245; Potassium 4.3; Sodium 133    Lipid Panel Lab Results  Component Value Date   CHOL 174 01/02/2016   HDL 56 01/02/2016   LDLCALC 106 (H) 01/02/2016   TRIG 60 01/02/2016      Wt Readings from Last 3 Encounters:   09/20/16 205 lb 12 oz (93.3 kg)  08/09/16 200 lb (90.7 kg)  02/03/16 181 lb 8 oz (82.3 kg)       ASSESSMENT AND PLAN:  Screening for cardiovascular condition - Plan: EKG 12-Lead, CT CARDIAC SCORING Previous chest pain normal stress test, normal echocardiogram. CT coronary calcium score 10  Chest pain, unspecified type - Plan: CT CARDIAC SCORING Previously felt to be Atypical in nature, often associated with severe anxiety  Elevated troponin - Plan: CT CARDIAC SCORING previous panic attack, debilitating enough to cause elevation of her troponin. CT coronary calcium score 10  Anxiety panic attacks. Recommend she follow-up with primary care, may need Xanax as needed  Essential hypertension In general blood pressure well controlled Recommended for spike in her blood pressure in the setting of anxiety she try nitroglycerin, hydralazine if needed  Mixed hyperlipidemia Encouraged her to stay on her statin 20 mg daily  Total encounter time more than 25 minutes  Greater than 50% was spent in counseling and coordination of care with the patient     No orders of the defined types were placed in this encounter.    Signed, Dossie Arbourim Gollan, M.D., Ph.D. 09/20/2016  [facility], [place] [phone]

## 2016-09-20 ENCOUNTER — Encounter: Payer: Self-pay | Admitting: Cardiovascular Disease

## 2016-09-20 ENCOUNTER — Ambulatory Visit (INDEPENDENT_AMBULATORY_CARE_PROVIDER_SITE_OTHER): Payer: Medicare Other | Admitting: Cardiovascular Disease

## 2016-09-20 VITALS — BP 170/110 | HR 61 | Ht 64.0 in | Wt 205.8 lb

## 2016-09-20 DIAGNOSIS — I2510 Atherosclerotic heart disease of native coronary artery without angina pectoris: Secondary | ICD-10-CM

## 2016-09-20 DIAGNOSIS — E782 Mixed hyperlipidemia: Secondary | ICD-10-CM

## 2016-09-20 DIAGNOSIS — I2584 Coronary atherosclerosis due to calcified coronary lesion: Secondary | ICD-10-CM

## 2016-09-20 DIAGNOSIS — R0789 Other chest pain: Secondary | ICD-10-CM

## 2016-09-20 DIAGNOSIS — R0602 Shortness of breath: Secondary | ICD-10-CM

## 2016-09-20 DIAGNOSIS — I10 Essential (primary) hypertension: Secondary | ICD-10-CM

## 2016-09-20 MED ORDER — HYDRALAZINE HCL 50 MG PO TABS: 50.0000 mg | ORAL_TABLET | Freq: Three times a day (TID) | ORAL | 3 refills | Status: DC | PRN

## 2016-09-20 NOTE — Patient Instructions (Addendum)
Medication Instructions:   Please take hydralazine for high pressures 1 pill at a time Ok to repeat if no improvement after 1 to 2 hours  Labwork:  No new labs needed  Testing/Procedures:  No further testing at this time   Follow-Up: It was a pleasure seeing you in the office today. Please call us if you have new issues that need to be addressed before your next appt.  [phone]  Your physician wants you to follow-up in: 12 months.  You will receive a reminder letter in the mail two months in advance. If you don't receive a letter, please call our office to schedule the follow-up appointment.  If you need a refill on your cardiac medications before your next appointment, please call your pharmacy.

## 2016-09-27 ENCOUNTER — Encounter: Payer: Self-pay | Admitting: Cardiovascular Disease

## 2017-02-08 ENCOUNTER — Other Ambulatory Visit: Payer: Self-pay | Admitting: Internal Medicine

## 2017-02-08 DIAGNOSIS — E269 Hyperaldosteronism, unspecified: Secondary | ICD-10-CM

## 2017-02-08 DIAGNOSIS — I152 Hypertension secondary to endocrine disorders: Secondary | ICD-10-CM

## 2017-02-09 ENCOUNTER — Other Ambulatory Visit: Payer: Self-pay | Admitting: Internal Medicine

## 2017-02-09 DIAGNOSIS — Z1231 Encounter for screening mammogram for malignant neoplasm of breast: Secondary | ICD-10-CM

## 2017-02-24 ENCOUNTER — Ambulatory Visit
Admission: RE | Admit: 2017-02-24 | Discharge: 2017-02-24 | Disposition: A | Discharge status: Home / Self Care | Payer: Medicare Other | Source: Ambulatory Visit | Attending: Internal Medicine | Admitting: Internal Medicine

## 2017-02-24 DIAGNOSIS — I152 Hypertension secondary to endocrine disorders: Secondary | ICD-10-CM

## 2017-02-24 DIAGNOSIS — E269 Hyperaldosteronism, unspecified: Secondary | ICD-10-CM | POA: Insufficient documentation

## 2017-03-29 ENCOUNTER — Ambulatory Visit
Admission: RE | Admit: 2017-03-29 | Discharge: 2017-03-29 | Disposition: A | Discharge status: Home / Self Care | Payer: Medicare Other | Source: Ambulatory Visit | Attending: Internal Medicine | Admitting: Internal Medicine

## 2017-03-29 DIAGNOSIS — Z1231 Encounter for screening mammogram for malignant neoplasm of breast: Secondary | ICD-10-CM | POA: Diagnosis present

## 2017-06-11 ENCOUNTER — Emergency Department
Admission: EM | Admit: 2017-06-11 | Discharge: 2017-06-11 | Disposition: A | Discharge status: Home / Self Care | Payer: Medicare Other | Attending: Emergency Medicine | Admitting: Emergency Medicine

## 2017-06-11 ENCOUNTER — Other Ambulatory Visit: Payer: Self-pay

## 2017-06-11 ENCOUNTER — Encounter: Payer: Self-pay | Admitting: *Deleted

## 2017-06-11 DIAGNOSIS — I10 Essential (primary) hypertension: Secondary | ICD-10-CM | POA: Diagnosis not present

## 2017-06-11 DIAGNOSIS — F419 Anxiety disorder, unspecified: Secondary | ICD-10-CM | POA: Diagnosis not present

## 2017-06-11 DIAGNOSIS — Z79899 Other long term (current) drug therapy: Secondary | ICD-10-CM | POA: Diagnosis not present

## 2017-06-11 DIAGNOSIS — E860 Dehydration: Secondary | ICD-10-CM | POA: Diagnosis not present

## 2017-06-11 DIAGNOSIS — R0981 Nasal congestion: Secondary | ICD-10-CM

## 2017-06-11 DIAGNOSIS — Z7982 Long term (current) use of aspirin: Secondary | ICD-10-CM | POA: Insufficient documentation

## 2017-06-11 HISTORY — DX: Disorder of adrenal gland, unspecified: E27.9

## 2017-06-11 LAB — BASIC METABOLIC PANEL
ANION GAP: 11 (ref 5–15)
BUN: 26 mg/dL — ABNORMAL HIGH (ref 6–20)
CHLORIDE: 100 mmol/L — AB (ref 101–111)
CO2: 20 mmol/L — ABNORMAL LOW (ref 22–32)
Calcium: 9 mg/dL (ref 8.9–10.3)
Creatinine, Ser: 0.9 mg/dL (ref 0.44–1.00)
GFR calc Af Amer: >60 mL/min (ref >60)
Glucose, Bld: 117 mg/dL — ABNORMAL HIGH (ref 65–99)
Potassium: 4.1 mmol/L (ref 3.5–5.1)
SODIUM: 131 mmol/L — AB (ref 135–145)

## 2017-06-11 LAB — URINALYSIS, COMPLETE (UACMP) WITH MICROSCOPIC
Bilirubin Urine: NEGATIVE
GLUCOSE, UA: NEGATIVE mg/dL
Hgb urine dipstick: NEGATIVE
KETONES UR: NEGATIVE mg/dL
LEUKOCYTES UA: NEGATIVE
NITRITE: NEGATIVE
PROTEIN: NEGATIVE mg/dL
Specific Gravity, Urine: 1.004 — ABNORMAL LOW (ref 1.005–1.030)
pH: 6 (ref 5.0–8.0)

## 2017-06-11 LAB — CBC
HCT: 44.4 % (ref 35.0–47.0)
HEMOGLOBIN: 15.1 g/dL (ref 12.0–16.0)
MCH: 30.3 pg (ref 26.0–34.0)
MCHC: 34 g/dL (ref 32.0–36.0)
MCV: 89.1 fL (ref 80.0–100.0)
Platelets: 193 10*3/uL (ref 150–440)
RBC: 4.99 MIL/uL (ref 3.80–5.20)
RDW: 13.7 % (ref 11.5–14.5)
WBC: 8.8 10*3/uL (ref 3.6–11.0)

## 2017-06-11 MED ORDER — SODIUM CHLORIDE 0.9 % IV BOLUS
500.0000 mL | Freq: Once | INTRAVENOUS | Status: AC
  Administered 2017-06-11: 500 mL via INTRAVENOUS

## 2017-06-11 NOTE — ED Notes (Signed)
Pt assisted to the bathroom, UA collected by Maralyn SagoSarah, RN.

## 2017-06-11 NOTE — ED Notes (Signed)
Pt states, "I don't feel bad and I didn't feel very bad when I came in". Pt states, "I just feel odd and I didn't describe my symptoms very well when I came in". Pt states she was admitted for obs in 2017 and had a stress test with f/u with Dr. Gwen PoundsKowalski no negative results. Pt states she has "spells" where she wakes up and c/o stuffiness to her nose. Pt states "I know I can breathe just fine but I just get the sensation that I won't be able too". Pt also c/o tightening sensation to bilateral temples, pt states "it not severe but it's noticeable". Pt also c/o tightening to jaw and lower throat area. Pt states "I get agitated and anxious, I live by myself, I'll be perfectly fine and be asleep then wake up". Pt states several times that she feels "bad about being here". Pt is alert and oriented, able to speak in complete sentences at this time.

## 2017-06-11 NOTE — ED Triage Notes (Signed)
Pt states that she woke up from her sleep with a stuffiness in her sinuses, feeling thirsty,  a tightness around her temples, and just generally not feeling well tonight. She denies chest pain, n/v or shortness of breath. Pt says that she has been under a lot of stress.

## 2017-06-11 NOTE — ED Provider Notes (Signed)
[facility] Emergency Department Provider Note  ____________________________________________   I have reviewed the triage vital signs and the nursing notes. Where available I have reviewed prior notes and, if possible and indicated, outside hospital notes.    HISTORY  Chief Complaint multiple complaints    HPI Joanne Lewis is a 72 y.o. female states that she was in her normal state of health except for she is been very emotionally upset recently because of the death of a family member.  She states she deals with a lot of stress.  She woke up in the middle the night and noticed that her nose was stuffy.  It was stuffy on both sides.  This lasted briefly and is now better.  She states that last time she had a stuffy nose like this she had a full cardiac work-up which was negative.  She denies any chest pain shortness of breath nausea or vomiting she denies any exertional symptoms, she has no headache no stiff neck no fever, she does not have any other URI symptoms such as cough.  She does not have rhinorrhea at this time.  She just had a stuffy feeling in bilateral cheeks.  She denies any numbness or weakness or change in vision.  She states that she was very anxious about this initially but now feels that perhaps she should not have come     Past Medical History:  Diagnosis Date  . 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HCC)   . Anginal pain (HCC)   . Arthritis   . Fatigue   . Hyperkalemia   . Hyperlipidemia   . Hypertension     Patient Active Problem List   Diagnosis Date Noted  . Coronary artery calcification 09/19/2016  . Chest pain 02/03/2016  . Elevated troponin 02/03/2016  . Anxiety 02/03/2016  . Essential hypertension 02/03/2016  . Mixed hyperlipidemia 02/03/2016  . Chest tightness 01/01/2016    Past Surgical History:  Procedure Laterality Date  . COLONOSCOPY WITH PROPOFOL N/A 08/09/2016   Procedure: COLONOSCOPY WITH PROPOFOL;  Surgeon: Scot JunElliott,  Robert T, MD;  [facility] ENDOSCOPY;  Service: Endoscopy;  Laterality: N/A;    Prior to Admission medications   Medication Sig Start Date End Date Taking? Authorizing Provider  amLODipine (NORVASC) 5 MG tablet Take 5 mg by mouth daily.    [provider]  aspirin EC 81 MG tablet Take 81 mg by mouth daily.     [provider]  atorvastatin (LIPITOR) 40 MG tablet Take 1 tablet (40 mg total) by mouth daily at 6 PM. Patient taking differently: Take 20 mg by mouth daily at 6 PM.  01/02/16   Shaune Pollackhen, Qing, MD  carvedilol (COREG) 3.125 MG tablet Take 1 tablet (3.125 mg total) by mouth 2 (two) times daily with a meal. 01/02/16   Shaune Pollackhen, Qing, MD  hydrALAZINE (APRESOLINE) 50 MG tablet Take 1 tablet (50 mg total) by mouth 3 (three) times daily as needed. 09/20/16   [name], [name], MD  latanoprost (XALATAN) 0.005 % ophthalmic solution Place 1 drop into both eyes at bedtime.    [provider]  nitroGLYCERIN (NITROSTAT) 0.4 MG SL tablet Place 1 tablet (0.4 mg total) under the tongue every 5 (five) minutes as needed for chest pain. 01/02/16   Shaune Pollackhen, Qing, MD  timolol (BETIMOL) 0.5 % ophthalmic solution Place 1 drop into both eyes daily.    [provider]    Allergies Codeine  Family History  Problem Relation Age of Onset  .  Heart attack Father     Social History Social History   Tobacco Use  . Smoking status: Never Smoker  . Smokeless tobacco: Never Used  Substance Use Topics  . Alcohol use: No  . Drug use: No    Review of Systems Constitutional: No fever/chills Eyes: No visual changes. ENT: No sore throat. No stiff neck no neck pain Cardiovascular: Denies chest pain. Respiratory: Denies shortness of breath. Gastrointestinal:   no vomiting.  No diarrhea.  No constipation. Genitourinary: Negative for dysuria. Musculoskeletal: Negative lower extremity swelling Skin: Negative for rash. Neurological: Negative for severe headaches, focal weakness or  numbness.   ____________________________________________   PHYSICAL EXAM:  VITAL SIGNS: ED Triage Vitals [06/11/17 0330]  Enc Vitals Group     BP (!) 166/71     Pulse Rate 75     Resp 16     Temp 97.7 F (36.5 C)     Temp Source Oral     SpO2 98 %     Weight      Height      Head Circumference      Peak Flow      Pain Score      Pain Loc      Pain Edu?      Excl. in GC?     Constitutional: Alert and oriented. Well appearing and in no acute distress. Eyes: Conjunctivae are normal Head: Atraumatic HEENT: No congestion/rhinnorhea. Mucous membranes are moist.  Oropharynx non-erythematous Neck:   Nontender with no meningismus, no masses, no stridor Cardiovascular: Normal rate, regular rhythm. Grossly normal heart sounds.  Good peripheral circulation. Respiratory: Normal respiratory effort.  No retractions. Lungs CTAB. Abdominal: Soft and nontender. No distention. No guarding no rebound Back:  There is no focal tenderness or step off.  there is no midline tenderness there are no lesions noted. there is no CVA tenderness Musculoskeletal: No lower extremity tenderness, no upper extremity tenderness. No joint effusions, no DVT signs strong distal pulses no edema Neurologic:  Normal speech and language. No gross focal neurologic deficits are appreciated.  Skin:  Skin is warm, dry and intact. No rash noted. Psychiatric: Mood and affect are somewhat anxious. Speech and behavior are normal.  ____________________________________________   LABS (all labs ordered are listed, but only abnormal results are displayed)  Labs Reviewed  BASIC METABOLIC PANEL - Abnormal; Notable for the following components:      Result Value   Sodium 131 (*)    Chloride 100 (*)    CO2 20 (*)    Glucose, Bld 117 (*)    BUN 26 (*)    All other components within normal limits  CBC  URINALYSIS, COMPLETE (UACMP) WITH MICROSCOPIC    Pertinent labs  results that were available during my care of the  patient were reviewed by me and considered in my medical decision making (see chart for details). ____________________________________________  EKG  I personally interpreted any EKGs ordered by me or triage Sinus rhythm, rate 65, old right bundle branch block, no acute ST elevation or acute ischemic changes noted.  No significant change from prior ____________________________________________  RADIOLOGY  Pertinent labs & imaging results that were available during my care of the patient were reviewed by me and considered in my medical decision making (see chart for details). If possible, patient and/or family made aware of any abnormal findings.  No results found. ____________________________________________    PROCEDURES  Procedure(s) performed: None  Procedures  Critical Care performed: None  ____________________________________________  INITIAL IMPRESSION / ASSESSMENT AND PLAN / ED COURSE  Pertinent labs & imaging results that were available during my care of the patient were reviewed by me and considered in my medical decision making (see chart for details).  Patient here after having a stuffy feeling in her nose in the middle the night which she no longer has.  She also complains of anxiety but has no SI or HI.  Exam is reassuring vital signs are reassuring EKG is reassuring blood work is reassuring, she is slightly very slightly hyponatremic and very slightly dehydrated, she states that because of a recent death of a family where she has not been eating and drinking as much as normal but she is trying to do her best and she gave me a pretty good rundown of her diet yesterday which seemed okay.  She certainly is not in any danger from either of those things at this time.  Is not clear what the stuffy sensation in her nose is from.  She might be getting a cold but at this time there is no evidence that is a referred or more sinister pathology present.     ____________________________________________   FINAL CLINICAL IMPRESSION(S) / ED DIAGNOSES  Final diagnoses:  None      This chart was dictated using voice recognition software.  Despite best efforts to proofread,  errors can occur which can change meaning.      Jeanmarie Plant, MD 06/11/17 [phone]

## 2017-06-11 NOTE — ED Notes (Signed)
Pt resting in bed with NAD noted. Apologized for and explained delay. Pt states understanding. Will continue to monitor for further patient needs.

## 2017-06-11 NOTE — ED Notes (Signed)
NAD noted at time of D/C. Pt denies questions or concerns. Pt ambulatory to the lobby at this time. Pt refused wheelchair to the lobby.  

## 2017-08-30 ENCOUNTER — Telehealth: Payer: Self-pay

## 2017-08-30 NOTE — Telephone Encounter (Signed)
l mom to r/s appt for 9/11, to later time in the day, Dr. Mariah MillingGollan out of office at the current scheduled time.

## 2017-09-20 NOTE — Progress Notes (Deleted)
Cardiology Office Note  Date:  09/20/2017   ID:  Joanne Lewis, Joanne Lewis 1945/05/26, MRN [id number]  PCP:  Dr. Thedore Mins  No chief complaint on file.   HPI:  72 year old woman with history of Anxiety/stress hypertension,  Hyperlipidemia shortness of breath.  Recent normal stress test, normal echocardiogram. Coronary calcium score of 10.  seen by outside cardiologist, would like second opinion  Lightheaded, BP elevated, 3 times at home Went to ER 08/09/2016 200 systolic Waited in Er, came down   She is concerned as blood pressure will spike sometimes with dizziness, tingly fingers In general blood pressure has been running well at home Elevated on initial check, improved on repeat down to 150/90  Weight up 25 pounds, eating the wrong foods over the past 6 months  Lipitor 20 mg daily (takes 1/2 40 mg), no side effects  Total chol 139, LDL 75 Total cho 174 before medication HBA1C 5.4  Taking coreg 1/2 BID  EKG personally reviewed by myself on todays visit Shows normal sinus rhythm rate 61 bpm right bundle branch block  Other past medical history reviewed Loss of her son December 2012 Sees Dr. Azucena Kuba with [facility] 5 yr ago Reports losing 50 pounds by  eliminating soda and various drinks in 2016, was riding stationary bike  At the [facility] 05/2014, woke up out of sleep, unable to get up Eventually sx resolved  Seen in the hospital for chest pain 01/01/2016 (very stressed) Tightness in her head,buzzing in ears, throat tightening, hot and flush over chest, hot sensation, felt like she was going to pass out, pain left flank  Stress and the holiday season,  waves of chest pressure radiating to her throat at times Troponin elevation to 0.98  Stress test done in the hospital 01/02/2016 showing no ischemia, normal ejection fraction  Seen again in the hospital emergency room 01/10/2016, tingling in her legs Felt like panic attack was happening again, took NTG x 1 Called EMT CT head  was obtained  Shortness of breath for the past 4 weeks, associated with walking, stairs, exercise relieved with rest. Some fatigue, irregular heartbeat  She had Holter monitor showing occasional PVCs, APCs, asymptomatic bradycardia normal sinus rhythm with bundle branch block with a maximum heart rate of 102 bpm minimum 43 beats minute average is 60 bpm.  Without holter had throat tightening,   Tolerating Lipitor She is only taking half dose of her carvedilol, did not feel well on the full dose  HB1C 5.2 Total chol 174, LDL 106, then started on lipitor 1/2 pill  Echocardiogram performed 01/23/2016 showing normal function ejection fraction 55%, mild AR, mild MR, borderline dilated left atrium,  Orthostatics done on her visit today, systolic pressure around 170 with no change from supine to standing position, heart rate did increase from 66 bpm up to 80 bpm with standing   PMH:   has a past medical history of 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HCC), Anginal pain (HCC), Arthritis, Fatigue, Hyperkalemia, Hyperlipidemia, and Hypertension.  PSH:    Past Surgical History:  Procedure Laterality Date  . COLONOSCOPY WITH PROPOFOL N/A 08/09/2016   Procedure: COLONOSCOPY WITH PROPOFOL;  Surgeon: Scot Jun, MD;  Location: [facility] ENDOSCOPY;  Service: Endoscopy;  Laterality: N/A;    Current Outpatient Medications  Medication Sig Dispense Refill  . amLODipine (NORVASC) 5 MG tablet Take 5 mg by mouth daily.    Marland Kitchen aspirin EC 81 MG tablet Take 81 mg by mouth daily.     Marland Kitchen atorvastatin (LIPITOR) 40 MG  tablet Take 1 tablet (40 mg total) by mouth daily at 6 PM. (Patient taking differently: Take 20 mg by mouth daily at 6 PM. ) 30 tablet 2  . carvedilol (COREG) 3.125 MG tablet Take 1 tablet (3.125 mg total) by mouth 2 (two) times daily with a meal. 60 tablet 2  . hydrALAZINE (APRESOLINE) 50 MG tablet Take 1 tablet (50 mg total) by mouth 3 (three) times daily as needed. 90 tablet 3  . latanoprost (XALATAN) 0.005  % ophthalmic solution Place 1 drop into both eyes at bedtime.    . nitroGLYCERIN (NITROSTAT) 0.4 MG SL tablet Place 1 tablet (0.4 mg total) under the tongue every 5 (five) minutes as needed for chest pain. 30 tablet 0  . timolol (BETIMOL) 0.5 % ophthalmic solution Place 1 drop into both eyes daily.     No current facility-administered medications for this visit.      Allergies:   Codeine   Social History:  The patient  reports that she has never smoked. She has never used smokeless tobacco. She reports that she does not drink alcohol or use drugs.   Family History:   family history includes Heart attack in her father.  Age 46, died   Review of Systems: Review of Systems  Constitutional: Negative.   Respiratory: Negative.   Cardiovascular: Negative.   Gastrointestinal: Negative.   Musculoskeletal: Negative.   Neurological: Positive for dizziness.  Psychiatric/Behavioral: The patient is nervous/anxious.   All other systems reviewed and are negative.    PHYSICAL EXAM: VS:  There were no vitals taken for this visit. , BMI There is no height or weight on file to calculate BMI. GEN: Well nourished, well developed, in no acute distress  HEENT: normal  Neck: no JVD, carotid bruits, or masses Cardiac: RRR; no murmurs, rubs, or gallops,no edema  Respiratory:  clear to auscultation bilaterally, normal work of breathing GI: soft, nontender, nondistended, + BS MS: no deformity or atrophy  Skin: warm and dry, no rash Neuro:  Strength and sensation are intact Psych: euthymic mood, full affect    Recent Labs: 06/11/2017: BUN 26; Creatinine, Ser 0.90; Hemoglobin 15.1; Platelets 193; Potassium 4.1; Sodium 131    Lipid Panel Lab Results  Component Value Date   CHOL 174 01/02/2016   HDL 56 01/02/2016   LDLCALC 106 (H) 01/02/2016   TRIG 60 01/02/2016      Wt Readings from Last 3 Encounters:  09/20/16 205 lb 12 oz (93.3 kg)  08/09/16 200 lb (90.7 kg)  02/03/16 181 lb 8 oz (82.3  kg)       ASSESSMENT AND PLAN:  Screening for cardiovascular condition - Plan: EKG 12-Lead, CT CARDIAC SCORING Previous chest pain normal stress test, normal echocardiogram. CT coronary calcium score 10  Chest pain, unspecified type - Plan: CT CARDIAC SCORING Previously felt to be Atypical in nature, often associated with severe anxiety  Elevated troponin - Plan: CT CARDIAC SCORING previous panic attack, debilitating enough to cause elevation of her troponin. CT coronary calcium score 10  Anxiety panic attacks. Recommend she follow-up with primary care, may need Xanax as needed  Essential hypertension In general blood pressure well controlled Recommended for spike in her blood pressure in the setting of anxiety she try nitroglycerin, hydralazine if needed  Mixed hyperlipidemia Encouraged her to stay on her statin 20 mg daily    Total encounter time more than 25 minutes  Greater than 50% was spent in counseling and coordination of care with the patient  No orders of the defined types were placed in this encounter.    Signed, Dossie Arbour, M.D., Ph.D. 09/20/2017  [facility] [place], [place] [phone]

## 2017-09-21 ENCOUNTER — Ambulatory Visit: Payer: Medicare Other | Admitting: Cardiovascular Disease

## 2017-10-10 DIAGNOSIS — R0602 Shortness of breath: Secondary | ICD-10-CM | POA: Insufficient documentation

## 2017-10-10 NOTE — Progress Notes (Signed)
Cardiology Office Note  Date:  10/12/2017   ID:  Joanne Lewis, Joanne Lewis [birth year], MRN [id number]  PCP:  Dr. Thedore Mins  Chief Complaint  Patient presents with  . OTHER    12 month f/u no complaints today and requesting nitro refill last filled 2017. Meds reviewed verbally with pt.    HPI:  72 year old woman with history of Anxiety/stress hypertension,  Hyperlipidemia shortness of breath.  Recent normal stress test, normal echocardiogram. Coronary calcium score of 10.  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, started on aldactone Who presents for follow-up of her hypertension  On today's visit very concerned about her medications, various changes, Diagnosis of having a kidney problem Started on spironolactone 25 been up to 50 mg earlier in 2019 Significant orthostatic symptoms May and June Lab work at that time showing prerenal state, hyponatremia  Reports blood pressure typically running 115 systolic in sitting position Has fatigue, wonders if she is overmedicated Drinking plenty of fluid Was told not to drink water by primary care Told she has a kidney problem so she started a renal diet and not eating very much Told she had diabetes Concerned that GFR is 55  Taking coreg 3.125  1/2 pill BID Amlodipine 10.  She is confused as primary care told her to cut this in half but prescription is still at 10 Spironolactone 50  EKG personally reviewed by myself on todays visit Shows normal sinus rhythm rate 63 Bpm right bundle branch block  Other past medical history reviewed Loss of her son December 2012 Sees Dr. Azucena Kuba with [facility] 5 yr ago Reports losing 50 pounds by  eliminating soda and various drinks in 2016, was riding stationary bike  At the [facility] 05/2014, woke up out of sleep, unable to get up Eventually sx resolved  Seen in the hospital for chest pain 01/01/2016 (very stressed) Tightness in her head,buzzing in ears, throat tightening, hot and flush over chest,  hot sensation, felt like she was going to pass out, pain left flank  Stress and the holiday season,  waves of chest pressure radiating to her throat at times Troponin elevation to 0.98  Stress test done in the hospital 01/02/2016 showing no ischemia, normal ejection fraction  Seen again in the hospital emergency room 01/10/2016, tingling in her legs Felt like panic attack was happening again, took NTG x 1 Called EMT CT head was obtained  Shortness of breath for the past 4 weeks, associated with walking, stairs, exercise relieved with rest. Some fatigue, irregular heartbeat  She had Holter monitor showing occasional PVCs, APCs, asymptomatic bradycardia normal sinus rhythm with bundle branch block with a maximum heart rate of 102 bpm minimum 43 beats minute average is 60 bpm.  Without holter had throat tightening,   Tolerating Lipitor She is only taking half dose of her carvedilol, did not feel well on the full dose  HB1C 5.2 Total chol 174, LDL 106, then started on lipitor 1/2 pill  Echocardiogram performed 01/23/2016 showing normal function ejection fraction 55%, mild AR, mild MR, borderline dilated left atrium,  Orthostatics done on her visit today, systolic pressure around 170 with no change from supine to standing position, heart rate did increase from 66 bpm up to 80 bpm with standing   PMH:   has a past medical history of 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HCC), Anginal pain (HCC), Arthritis, Fatigue, Hyperkalemia, Hyperlipidemia, and Hypertension.  PSH:    Past Surgical History:  Procedure Laterality Date  . COLONOSCOPY WITH PROPOFOL N/A 08/09/2016  Procedure: COLONOSCOPY WITH PROPOFOL;  Surgeon: Scot Jun, MD;  Location: [facility] ENDOSCOPY;  Service: Endoscopy;  Laterality: N/A;    Current Outpatient Medications  Medication Sig Dispense Refill  . amLODipine (NORVASC) 10 MG tablet Take 10 mg by mouth daily.    Marland Kitchen aspirin EC 81 MG tablet Take 81 mg by mouth daily.     Marland Kitchen  atorvastatin (LIPITOR) 20 MG tablet Take 20 mg by mouth daily.    . carvedilol (COREG) 3.125 MG tablet Take 1 tablet (3.125 mg total) by mouth 2 (two) times daily with a meal. (Patient taking differently: Take 0.5 mg by mouth 2 (two) times daily with a meal. ) 60 tablet 2  . hydrALAZINE (APRESOLINE) 50 MG tablet Take 1 tablet (50 mg total) by mouth 3 (three) times daily as needed. 90 tablet 3  . latanoprost (XALATAN) 0.005 % ophthalmic solution Place 1 drop into both eyes at bedtime.    . nitroGLYCERIN (NITROSTAT) 0.4 MG SL tablet Place 1 tablet (0.4 mg total) under the tongue every 5 (five) minutes as needed for chest pain. 25 tablet 1  . terbinafine (LAMISIL) 250 MG tablet Take by mouth daily.    . timolol (BETIMOL) 0.5 % ophthalmic solution Place 1 drop into both eyes daily.     No current facility-administered medications for this visit.      Allergies:   Codeine   Social History:  The patient  reports that she has never smoked. She has never used smokeless tobacco. She reports that she does not drink alcohol or use drugs.   Family History:   family history includes Heart attack in her father.  Age 81, died   Review of Systems: Review of Systems  Constitutional: Negative.   Respiratory: Negative.   Cardiovascular: Negative.   Gastrointestinal: Negative.   Musculoskeletal: Negative.   Neurological: Negative.   Psychiatric/Behavioral: The patient is nervous/anxious.   All other systems reviewed and are negative.    PHYSICAL EXAM: VS:  BP 130/90 (BP Location: Left Arm, Patient Position: Sitting, Cuff Size: Normal)   Pulse 63   Ht 5\' 4"  (1.626 m)   Wt 177 lb 8 oz (80.5 kg)   BMI 30.47 kg/m  , BMI Body mass index is 30.47 kg/m. Constitutional:  oriented to person, place, and time. No distress.  HENT:  Head: Normocephalic and atraumatic.  Eyes:  no discharge. No scleral icterus.  Neck: Normal range of motion. Neck supple. No JVD present.  Cardiovascular: Normal rate,  regular rhythm, normal heart sounds and intact distal pulses. Exam reveals no gallop and no friction rub. No edema No murmur heard. Pulmonary/Chest: Effort normal and breath sounds normal. No stridor. No respiratory distress.  no wheezes.  no rales.  no tenderness.  Abdominal: Soft.  no distension.  no tenderness.  Musculoskeletal: Normal range of motion.  no  tenderness or deformity.  Neurological:  normal muscle tone. Coordination normal. No atrophy Skin: Skin is warm and dry. No rash noted. not diaphoretic.  Psychiatric:  normal mood and affect. behavior is normal. Thought content normal.    Recent Labs: 06/11/2017: BUN 26; Creatinine, Ser 0.90; Hemoglobin 15.1; Platelets 193; Potassium 4.1; Sodium 131    Lipid Panel Lab Results  Component Value Date   CHOL 174 01/02/2016   HDL 56 01/02/2016   LDLCALC 106 (H) 01/02/2016   TRIG 60 01/02/2016      Wt Readings from Last 3 Encounters:  10/12/17 177 lb 8 oz (80.5 kg)  09/20/16 205 lb  12 oz (93.3 kg)  08/09/16 200 lb (90.7 kg)       ASSESSMENT AND PLAN:  Screening for cardiovascular condition - normal stress test, normal echocardiogram. CT coronary calcium score 10 No further work-up needed at this time  Chest pain, unspecified type -  severe anxiety in the past Denies any significant chest pain symptoms Recommended regular walking program  Anxiety panic attacks  in the  past possibly contributing to spikes in her pressure Managed by primary care  Essential hypertension In general blood pressure well controlled Not sure if she needs the carvedilol given prior orthostasis and systolic pressures 115 in a sitting position also with fatigue Polypharmacy Carvedilol likely not doing anything for her Recommend she try to wean down on the carvedilol try once a day for a week monitor blood pressures and if no significant change in blood pressure would stop the carvedilol  Mixed hyperlipidemia Cholesterol is at goal on the  current lipid regimen. No changes to the medications were made.    Total encounter time more than 45 minutes  Greater than 50% was spent in counseling and coordination of care with the patient     Orders Placed This Encounter  Procedures  . EKG 12-Lead     Signed, Dossie Arbour, M.D., Ph.D. 10/12/2017  [facility] [place], [place] [phone]

## 2017-10-12 ENCOUNTER — Encounter: Payer: Self-pay | Admitting: Cardiovascular Disease

## 2017-10-12 ENCOUNTER — Ambulatory Visit (INDEPENDENT_AMBULATORY_CARE_PROVIDER_SITE_OTHER): Payer: Medicare Other | Admitting: Cardiovascular Disease

## 2017-10-12 VITALS — BP 130/90 | HR 63 | Ht 64.0 in | Wt 177.5 lb

## 2017-10-12 DIAGNOSIS — R079 Chest pain, unspecified: Secondary | ICD-10-CM

## 2017-10-12 DIAGNOSIS — I10 Essential (primary) hypertension: Secondary | ICD-10-CM

## 2017-10-12 DIAGNOSIS — E782 Mixed hyperlipidemia: Secondary | ICD-10-CM

## 2017-10-12 DIAGNOSIS — R0602 Shortness of breath: Secondary | ICD-10-CM | POA: Diagnosis not present

## 2017-10-12 DIAGNOSIS — I2584 Coronary atherosclerosis due to calcified coronary lesion: Secondary | ICD-10-CM

## 2017-10-12 DIAGNOSIS — I2510 Atherosclerotic heart disease of native coronary artery without angina pectoris: Secondary | ICD-10-CM

## 2017-10-12 DIAGNOSIS — F419 Anxiety disorder, unspecified: Secondary | ICD-10-CM

## 2017-10-12 MED ORDER — NITROGLYCERIN 0.4 MG SL SUBL: 0.4000 mg | SUBLINGUAL_TABLET | SUBLINGUAL | 1 refills | Status: DC | PRN

## 2017-10-12 NOTE — Patient Instructions (Addendum)
Medication Instructions:   Consider weaning the coreg slowly Monitor BP  Labwork:  No new labs needed  Testing/Procedures:  No further testing at this time   Follow-Up: It was a pleasure seeing you in the office today. Please call us if you have new issues that need to be addressed before your next appt.  [phone]  Your physician wants you to follow-up in: 12 months.  You will receive a reminder letter in the mail two months in advance. If you don't receive a letter, please call our office to schedule the follow-up appointment.  If you need a refill on your cardiac medications before your next appointment, please call your pharmacy.  For educational health videos Log in to : [email or web address] Or : [email or web address], password : [email or web address]

## 2017-11-25 ENCOUNTER — Emergency Department: Payer: Medicare Other

## 2017-11-25 ENCOUNTER — Other Ambulatory Visit: Payer: Self-pay

## 2017-11-25 ENCOUNTER — Observation Stay
Admission: EM | Admit: 2017-11-25 | Discharge: 2017-11-26 | Disposition: A | Discharge status: Home / Self Care | Payer: Medicare Other | Attending: Surgery | Admitting: Surgery

## 2017-11-25 DIAGNOSIS — Z79899 Other long term (current) drug therapy: Secondary | ICD-10-CM | POA: Insufficient documentation

## 2017-11-25 DIAGNOSIS — I10 Essential (primary) hypertension: Secondary | ICD-10-CM | POA: Insufficient documentation

## 2017-11-25 DIAGNOSIS — R109 Unspecified abdominal pain: Secondary | ICD-10-CM | POA: Diagnosis present

## 2017-11-25 DIAGNOSIS — I2510 Atherosclerotic heart disease of native coronary artery without angina pectoris: Secondary | ICD-10-CM | POA: Insufficient documentation

## 2017-11-25 DIAGNOSIS — E782 Mixed hyperlipidemia: Secondary | ICD-10-CM | POA: Diagnosis not present

## 2017-11-25 DIAGNOSIS — K4130 Unilateral femoral hernia, with obstruction, without gangrene, not specified as recurrent: Principal | ICD-10-CM | POA: Diagnosis present

## 2017-11-25 DIAGNOSIS — Z7982 Long term (current) use of aspirin: Secondary | ICD-10-CM | POA: Diagnosis not present

## 2017-11-25 HISTORY — DX: Unilateral femoral hernia, with obstruction, without gangrene, not specified as recurrent: K41.30

## 2017-11-25 LAB — COMPREHENSIVE METABOLIC PANEL
ALK PHOS: 83 U/L (ref 38–126)
ALT: 14 U/L (ref 0–44)
AST: 22 U/L (ref 15–41)
Albumin: 4.5 g/dL (ref 3.5–5.0)
Anion gap: 9 (ref 5–15)
BUN: 15 mg/dL (ref 8–23)
CALCIUM: 9.3 mg/dL (ref 8.9–10.3)
CO2: 25 mmol/L (ref 22–32)
CREATININE: 0.84 mg/dL (ref 0.44–1.00)
Chloride: 102 mmol/L (ref 98–111)
Glucose, Bld: 135 mg/dL — ABNORMAL HIGH (ref 70–99)
Potassium: 4.2 mmol/L (ref 3.5–5.1)
SODIUM: 136 mmol/L (ref 135–145)
Total Bilirubin: 0.8 mg/dL (ref 0.3–1.2)
Total Protein: 7.8 g/dL (ref 6.5–8.1)

## 2017-11-25 LAB — CBC
HCT: 43.3 % (ref 36.0–46.0)
Hemoglobin: 14.5 g/dL (ref 12.0–15.0)
MCH: 30 pg (ref 26.0–34.0)
MCHC: 33.5 g/dL (ref 30.0–36.0)
MCV: 89.5 fL (ref 80.0–100.0)
NRBC: 0 % (ref 0.0–0.2)
PLATELETS: 211 10*3/uL (ref 150–400)
RBC: 4.84 MIL/uL (ref 3.87–5.11)
RDW: 12.2 % (ref 11.5–15.5)
WBC: 7.6 10*3/uL (ref 4.0–10.5)

## 2017-11-25 LAB — URINALYSIS, COMPLETE (UACMP) WITH MICROSCOPIC
Bacteria, UA: NONE SEEN
Bilirubin Urine: NEGATIVE
Glucose, UA: NEGATIVE mg/dL
Hgb urine dipstick: NEGATIVE
Ketones, ur: 5 mg/dL — AB
Leukocytes, UA: NEGATIVE
Nitrite: NEGATIVE
Protein, ur: NEGATIVE mg/dL
SPECIFIC GRAVITY, URINE: 1.017 (ref 1.005–1.030)
pH: 7 (ref 5.0–8.0)

## 2017-11-25 LAB — APTT: APTT: 24 s (ref 24–36)

## 2017-11-25 LAB — PROTIME-INR
INR: 1.04
PROTHROMBIN TIME: 13.5 s (ref 11.4–15.2)

## 2017-11-25 MED ORDER — LATANOPROST 0.005 % OP SOLN
1.0000 [drp] | Freq: Every day | OPHTHALMIC | Status: DC
  Administered 2017-11-26: 1 [drp] via OPHTHALMIC
  Filled 2017-11-25: qty 2.5

## 2017-11-25 MED ORDER — TERBINAFINE HCL 250 MG PO TABS
250.0000 mg | ORAL_TABLET | Freq: Every day | ORAL | Status: DC
  Administered 2017-11-25: 250 mg via ORAL
  Filled 2017-11-25 (×2): qty 1

## 2017-11-25 MED ORDER — TIMOLOL MALEATE 0.5 % OP SOLN
1.0000 [drp] | Freq: Every day | OPHTHALMIC | Status: DC
  Filled 2017-11-25: qty 5

## 2017-11-25 MED ORDER — IOPAMIDOL (ISOVUE-300) INJECTION 61%
30.0000 mL | Freq: Once | INTRAVENOUS | Status: AC
  Administered 2017-11-25: 30 mL via ORAL

## 2017-11-25 MED ORDER — IOPAMIDOL (ISOVUE-300) INJECTION 61%
100.0000 mL | Freq: Once | INTRAVENOUS | Status: AC | PRN
  Administered 2017-11-25: 100 mL via INTRAVENOUS

## 2017-11-25 MED ORDER — METOPROLOL TARTRATE 5 MG/5ML IV SOLN: 2.5000 mg | INTRAVENOUS | Status: DC | PRN

## 2017-11-25 MED ORDER — ONDANSETRON HCL 4 MG/2ML IJ SOLN
4.0000 mg | Freq: Once | INTRAMUSCULAR | Status: AC
  Administered 2017-11-25: 4 mg via INTRAVENOUS
  Filled 2017-11-25: qty 2

## 2017-11-25 MED ORDER — DEXTROSE IN LACTATED RINGERS 5 % IV SOLN
INTRAVENOUS | Status: DC
  Administered 2017-11-26: 01:00:00 via INTRAVENOUS

## 2017-11-25 MED ORDER — CHLORHEXIDINE GLUCONATE CLOTH 2 % EX PADS
6.0000 | MEDICATED_PAD | Freq: Once | CUTANEOUS | Status: AC
  Administered 2017-11-25: 6 via TOPICAL
  Filled 2017-11-25: qty 6

## 2017-11-25 MED ORDER — CARVEDILOL 6.25 MG PO TABS
3.1250 mg | ORAL_TABLET | Freq: Two times a day (BID) | ORAL | Status: DC
  Administered 2017-11-26: 3.125 mg via ORAL
  Filled 2017-11-25: qty 1

## 2017-11-25 MED ORDER — HYDRALAZINE HCL 50 MG PO TABS: 50.0000 mg | ORAL_TABLET | Freq: Three times a day (TID) | ORAL | Status: DC | PRN

## 2017-11-25 MED ORDER — CARVEDILOL 3.125 MG PO TABS: 1.5625 mg | ORAL_TABLET | Freq: Two times a day (BID) | ORAL | Status: DC

## 2017-11-25 MED ORDER — SPIRONOLACTONE 25 MG PO TABS: 50.0000 mg | ORAL_TABLET | Freq: Every day | ORAL | Status: DC

## 2017-11-25 MED ORDER — MORPHINE SULFATE (PF) 2 MG/ML IV SOLN
2.0000 mg | Freq: Once | INTRAVENOUS | Status: DC
Start: 2017-11-25 — End: 2017-11-26
  Filled 2017-11-25: qty 1

## 2017-11-25 MED ORDER — AMLODIPINE BESYLATE 5 MG PO TABS: 5.0000 mg | ORAL_TABLET | Freq: Every day | ORAL | Status: DC

## 2017-11-25 MED ORDER — CHLORHEXIDINE GLUCONATE CLOTH 2 % EX PADS
6.0000 | MEDICATED_PAD | Freq: Once | CUTANEOUS | Status: AC
  Administered 2017-11-26: 6 via TOPICAL
  Filled 2017-11-25: qty 6

## 2017-11-25 MED ORDER — ATORVASTATIN CALCIUM 20 MG PO TABS
20.0000 mg | ORAL_TABLET | Freq: Every evening | ORAL | Status: DC
  Administered 2017-11-25: 20 mg via ORAL
  Filled 2017-11-25: qty 1

## 2017-11-25 MED ORDER — HEPARIN SODIUM (PORCINE) 5000 UNIT/ML IJ SOLN
5000.0000 [IU] | Freq: Three times a day (TID) | INTRAMUSCULAR | Status: DC
  Administered 2017-11-25 – 2017-11-26 (×2): 5000 [IU] via SUBCUTANEOUS
  Filled 2017-11-25 (×2): qty 1

## 2017-11-25 MED ORDER — NITROGLYCERIN 0.4 MG SL SUBL: 0.4000 mg | SUBLINGUAL_TABLET | SUBLINGUAL | Status: DC | PRN

## 2017-11-25 NOTE — Consult Note (Signed)
[facility] [place] [facility]   PATIENT NAME: Joanne Lewis    MR#:  [id number]  DATE OF BIRTH:  01/15/1945  DATE OF ADMISSION:  11/25/2017  PRIMARY CARE PHYSICIAN: Leotis ShamesSingh, Jasmine, MD   REQUESTING/REFERRING PHYSICIAN: Dr. Earlene Plateravis.  CHIEF COMPLAINT:   Chief Complaint  Patient presents with  . Abdominal Pain   Medical management and preop evaluation. HISTORY OF PRESENT ILLNESS:  Joanne CourserVirginia Mounts  is a 72 y.o. female with a known history of hypertension, hyperlipidemia, arthritis and adrenal glands disorder.  The patient presented in the ED with abdominal pain with nausea today. She was found incarcerated Right femoral hernia which was reduced by Dr. Earlene Plateravis in ED. Dr. Onalee Huaavid is planned open repair of Right groin hernia tomorrow.  He requested preop evaluation and medical management for hypertension.  The patient denies any symptoms at this time.  She took spironolactone, Coreg and Norvasc in the morning. PAST MEDICAL HISTORY:   Past Medical History:  Diagnosis Date  . 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HCC)   . Anginal pain (HCC)   . Arthritis   . Fatigue   . Hyperkalemia   . Hyperlipidemia   . Hypertension     PAST SURGICAL HISTORY:   Past Surgical History:  Procedure Laterality Date  . COLONOSCOPY WITH PROPOFOL N/A 08/09/2016   Procedure: COLONOSCOPY WITH PROPOFOL;  Surgeon: Scot JunElliott, Robert T, MD;  [facility];  Service: Endoscopy;  Laterality: N/A;    SOCIAL HISTORY:   Social History   Tobacco Use  . Smoking status: Never Smoker  . Smokeless tobacco: Never Used  Substance Use Topics  . Alcohol use: No    FAMILY HISTORY:   Family History  Problem Relation Age of Onset  . Heart attack Father     DRUG ALLERGIES:   Allergies  Allergen Reactions  . Codeine Diarrhea and Nausea And Vomiting    REVIEW OF SYSTEMS:   Review of Systems  Constitutional: Negative for chills, fever and malaise/fatigue.  HENT: Negative for sore  throat.   Eyes: Negative for blurred vision and double vision.  Respiratory: Negative for cough, hemoptysis, shortness of breath, wheezing and stridor.   Cardiovascular: Negative for chest pain, palpitations, orthopnea and leg swelling.  Gastrointestinal: Positive for abdominal pain. Negative for blood in stool, diarrhea, melena, nausea and vomiting.  Genitourinary: Negative for dysuria, flank pain and hematuria.  Musculoskeletal: Negative for back pain and joint pain.  Skin: Negative for rash.  Neurological: Negative for dizziness, sensory change, focal weakness, seizures, loss of consciousness, weakness and headaches.  Endo/Heme/Allergies: Negative for polydipsia.  Psychiatric/Behavioral: Negative for depression. The patient is not nervous/anxious.     MEDICATIONS AT HOME:   Prior to Admission medications   Medication Sig Start Date End Date Taking? Authorizing Provider  amLODipine (NORVASC) 10 MG tablet Take 5 mg by mouth daily.    Yes [provider]  aspirin EC 81 MG tablet Take 81 mg by mouth daily.    Yes [provider]  atorvastatin (LIPITOR) 20 MG tablet Take 20 mg by mouth every evening.    Yes [provider]  carvedilol (COREG) 3.125 MG tablet Take 1.5625 mg by mouth 2 (two) times daily with a meal.   Yes [provider]  latanoprost (XALATAN) 0.005 % ophthalmic solution Place 1 drop into both eyes at bedtime.   Yes [provider]  nitroGLYCERIN (NITROSTAT) 0.4 MG SL tablet Place 1 tablet (0.4 mg total) under the tongue every 5 (five) minutes  as needed for chest pain. 10/12/17  Yes Antonieta Iba, MD  spironolactone (ALDACTONE) 50 MG tablet Take 50 mg by mouth daily.   Yes [provider]  terbinafine (LAMISIL) 250 MG tablet Take 250 mg by mouth daily.  08/10/17  Yes [provider]  timolol (BETIMOL) 0.5 % ophthalmic solution Place 1 drop into both eyes daily.   Yes [provider]  carvedilol (COREG)  3.125 MG tablet Take 1 tablet (3.125 mg total) by mouth 2 (two) times daily with a meal. Patient taking differently: Take 0.5 mg by mouth 2 (two) times daily with a meal.  01/02/16   Shaune Pollack, MD  hydrALAZINE (APRESOLINE) 50 MG tablet Take 1 tablet (50 mg total) by mouth 3 (three) times daily as needed. 09/20/16   Antonieta Iba, MD      VITAL SIGNS:  Blood pressure (!) 142/74, pulse 71, temperature 97.9 F (36.6 C), temperature source Oral, resp. rate 12, height 5\' 4"  (1.626 m), weight 78.9 kg, SpO2 100 %.  PHYSICAL EXAMINATION:  Physical Exam  GENERAL:  72 y.o.-year-old patient lying in the bed with no acute distress.  EYES: Pupils equal, round, reactive to light and accommodation. No scleral icterus. Extraocular muscles intact.  HEENT: Head atraumatic, normocephalic. Oropharynx and nasopharynx clear.  NECK:  Supple, no jugular venous distention. No thyroid enlargement, no tenderness.  LUNGS: Normal breath sounds bilaterally, no wheezing, rales,rhonchi or crepitation. No use of accessory muscles of respiration.  CARDIOVASCULAR: S1, S2 normal. No murmurs, rubs, or gallops.  ABDOMEN: Soft, nontender, nondistended. Bowel sounds present. No organomegaly or mass.  EXTREMITIES: No pedal edema, cyanosis, or clubbing.  NEUROLOGIC: Cranial nerves II through XII are intact. Muscle strength 5/5 in all extremities. Sensation intact. Gait not checked.  PSYCHIATRIC: The patient is alert and oriented x 3.  SKIN: No obvious rash, lesion, or ulcer.   LABORATORY PANEL:   CBC Recent Labs  Lab 11/25/17 1528  WBC 7.6  HGB 14.5  HCT 43.3  PLT 211   ------------------------------------------------------------------------------------------------------------------  Chemistries  Recent Labs  Lab 11/25/17 1528  NA 136  K 4.2  CL 102  CO2 25  GLUCOSE 135*  BUN 15  CREATININE 0.84  CALCIUM 9.3  AST 22  ALT 14  ALKPHOS 83  BILITOT 0.8    ------------------------------------------------------------------------------------------------------------------  Cardiac Enzymes No results for input(s): TROPONINI in the last 168 hours. ------------------------------------------------------------------------------------------------------------------  RADIOLOGY:  Ct Abdomen Pelvis W Contrast  Result Date: 11/25/2017 CLINICAL DATA:  Right sided lower abdominal pain. EXAM: CT ABDOMEN AND PELVIS WITH CONTRAST TECHNIQUE: Multidetector CT imaging of the abdomen and pelvis was performed using the standard protocol following bolus administration of intravenous contrast. CONTRAST:  ISOVUE-300 IOPAMIDOL (ISOVUE-300) INJECTION 61% COMPARISON:  CT scan dated 02/24/2017 FINDINGS: Lower chest: Negative. Hepatobiliary: Tiny benign cysts in the left lobe of the liver. Liver parenchyma is otherwise normal. Biliary tree is normal. Pancreas: Unremarkable. No pancreatic ductal dilatation or surrounding inflammatory changes. Spleen: Normal in size without focal abnormality. Adrenals/Urinary Tract: Adrenal glands are normal. Kidneys are normal except for bilateral peripelvic cysts of no consequence. No hydronephrosis. Bladder is normal. Stomach/Bowel: There is a right femoral hernia containing small bowel. There is proximal small bowel dilatation. There is compression of the right femoral and external iliac vein by the hernia. There is malrotation of the large and small bowel. The entire colon is in the left side of the abdomen. Small bowel is primarily in the right side of the abdomen. The duodenum does  not cross the midline. The cecum lies in the left upper quadrant and the appendix lies immediately anterior to the antrum of the stomach just to the left of midline. Vascular/Lymphatic: Aortic atherosclerosis. No enlarged abdominal or pelvic lymph nodes. Reproductive: 1 cm cyst or follicle in the normal-sized left ovary. Right ovary is normal. Endometrial cavity  of the uterus is slightly dilated with fluid. No discrete cervical mass. Other: No ascites. Musculoskeletal: No acute bone abnormality. Multilevel degenerative facet arthritis in the lumbar spine. IMPRESSION: 1. Right femoral hernia containing a loop of small bowel partial obstruction of the small bowel at that site, creating small-bowel obstruction. 2. 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 3. Slightly dilated fluid-filled endometrial cavity, atypical for a female of this age. No visible cervical mass. Electronically Signed   By: Francene Boyers M.D.   On: 11/25/2017 16:53      IMPRESSION AND PLAN:   Right femoral hernia containing a loop of small bowel partial Obstruction. Dr. Earlene Plater planned surgery tomorrow. The patient has moderate risk surgery.  Hypertension, controlled. Continue home hypertension medication for now, IV Lopressor PRN. Hold p.o. hypertension medication tomorrow, IV Lopressor PRN.  Adrenal gland disorder.  On spironolactone.  All the records are reviewed and case discussed with ED provider. Management plans discussed with the patient, family and they are in agreement.  CODE STATUS: Full code.  TOTAL TIME TAKING CARE OF THIS PATIENT: 37 minutes.    Shaune Pollack M.D on 11/25/2017 at 6:58 PM  Between 7am to 6pm - Pager - [phone]  After 6pm go to [email or web address] - Social research officer, government  [facility] [place] [facility]  Office  [phone]  CC: Primary care physician; Leotis Shames, MD   Note: This dictation was prepared with Dragon dictation along with smaller phrase technology. Any transcriptional errors that result from this process are unin

## 2017-11-25 NOTE — ED Notes (Signed)
Pt up to toilet 

## 2017-11-25 NOTE — Anesthesia Preprocedure Evaluation (Signed)
Anesthesia Evaluation  Patient identified by MRN, date of birth, ID band Patient awake    Reviewed: Allergy & Precautions, H&P , NPO status , Patient's Chart, lab work & pertinent test results, reviewed documented beta blocker date and time   Airway Mallampati: II  TM Distance: >3 FB Neck ROM: full    Dental  (+) Teeth Intact   Pulmonary neg pulmonary ROS,    Pulmonary exam normal        Cardiovascular Exercise Tolerance: Poor hypertension, On Medications + angina with exertion + CAD  Normal cardiovascular exam Rhythm:regular Rate:Normal     Neuro/Psych Anxiety negative neurological ROS  negative psych ROS   GI/Hepatic negative GI ROS, Neg liver ROS,   Endo/Other  negative endocrine ROS  Renal/GU negative Renal ROS  negative genitourinary   Musculoskeletal   Abdominal   Peds  Hematology negative hematology ROS (+)   Anesthesia Other Findings Past Medical History: No date: 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HCC) No date: Anginal pain (HCC) No date: Arthritis No date: Fatigue No date: Hyperkalemia No date: Hyperlipidemia No date: Hypertension Past Surgical History: 08/09/2016: COLONOSCOPY WITH PROPOFOL; N/A     Comment:  Procedure: COLONOSCOPY WITH PROPOFOL;  Surgeon: Scot JunElliott,              Robert T, MD;  [facility];  Service:               Endoscopy;  Laterality: N/A; BMI    Body Mass Index:  29.87 kg/m     Reproductive/Obstetrics negative OB ROS                             Anesthesia Physical Anesthesia Plan  ASA: III and emergent  Anesthesia Plan: General ETT   Post-op Pain Management:    Induction:   PONV Risk Score and Plan:   Airway Management Planned:   Additional Equipment:   Intra-op Plan:   Post-operative Plan:   Informed Consent: I have reviewed the patients History and Physical, chart, labs and discussed the procedure including the risks, benefits  and alternatives for the proposed anesthesia with the patient or authorized representative who has indicated his/her understanding and acceptance.   Dental Advisory Given  Plan Discussed with: CRNA  Anesthesia Plan Comments:         Anesthesia Quick Evaluation

## 2017-11-25 NOTE — Progress Notes (Signed)
Advanced Care Plan.  Purpose of Encounter: CODE STATUS. Parties in Attendance: The patient and me. Patient's Decisional Capacity: Yes. /Medical Story: Joanne Lewis  is a 72 y.o. female with a known history of hypertension, hyperlipidemia, arthritis and adrenal glands disorder.  The patient is being admitted for Right femoral hernia containing a loop of small bowel partial obstruction.  I discussed with the patient about her current condition, prognosis and CODE STATUS.  The patient wants to be resuscitated and intubated if she has cardiopulmonary arrest. Plan:  Code Status: Full code. Time spent discussing advance care planning: 17 minutes.

## 2017-11-25 NOTE — ED Triage Notes (Signed)
Pt comes via [facility] with c/o right lower abdominal pain that started at lunch time today. Pt also states a knot that she noticed on her right lower pelvic area. Pt states some nausea. EMS reports VSS.

## 2017-11-25 NOTE — ED Notes (Signed)
Pt has knot noted to right lower pelvic area. Pt states 4/10 pain and some nausea. No redness noted or drainage.

## 2017-11-25 NOTE — H&P (Addendum)
SURGICAL HISTORY & PHYSICAL (cpt [id number])  HISTORY OF PRESENT ILLNESS (HPI):  72 y.o. female [facility] ED tonight for RLQ abdominal pain. Patient reports she felt well in her usual state of health this morning, but throughout the morning she felt "bloated". After lunch with her granddaughter, she says her pain became worse with nausea as well, and she noticed a Right groin "lump" after getting out of her car after lunch. She denies having ever noticed a hernia before and passed a normal BM this afternoon. She denies routine constipation, frequent coughing or heavy lifting, and denies any emesis, fever/chills, CP, or SOB. In January of 2018, patient experienced some SOB and was found to have a Troponin of 0.98, though stress test was unremarkable, and cardiac enzyme was attributed to demand ischemia at that time. She has since been followed by cardiology with no further issues. She also previously had severe HTN until diagnosed with hyperaldosteronemia, for which she's taken spironolactone among her anti-HTN medications. She also states she is easily able to walk several blocks or up/down a flight of steps without CP or SOB and even walked up a flight of steps carrying a pile of wet laundry earlier today, after which she felt only mildly "winded" upon reaching the top of the steps.  PAST MEDICAL HISTORY (PMH):  Past Medical History:  Diagnosis Date  . 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HCC)   . Anginal pain (HCC)   . Arthritis   . Fatigue   . Hyperkalemia   . Hyperlipidemia   . Hypertension     Reviewed. Otherwise negative.   PAST SURGICAL HISTORY (PSH):  Past Surgical History:  Procedure Laterality Date  . COLONOSCOPY WITH PROPOFOL N/A 08/09/2016   Procedure: COLONOSCOPY WITH PROPOFOL;  Surgeon: Scot JunElliott, Robert T, MD;  [facility];  Service: Endoscopy;  Laterality: N/A;    Reviewed. Otherwise negative.   MEDICATIONS:  Prior to Admission medications   Medication Sig Start Date  End Date Taking? Authorizing Provider  amLODipine (NORVASC) 10 MG tablet Take 10 mg by mouth daily.    [provider]  aspirin EC 81 MG tablet Take 81 mg by mouth daily.     [provider]  atorvastatin (LIPITOR) 20 MG tablet Take 20 mg by mouth daily.    [provider]  carvedilol (COREG) 3.125 MG tablet Take 1 tablet (3.125 mg total) by mouth 2 (two) times daily with a meal. Patient taking differently: Take 0.5 mg by mouth 2 (two) times daily with a meal.  01/02/16   Shaune Pollackhen, Qing, MD  hydrALAZINE (APRESOLINE) 50 MG tablet Take 1 tablet (50 mg total) by mouth 3 (three) times daily as needed. 09/20/16   [name], [name], MD  latanoprost (XALATAN) 0.005 % ophthalmic solution Place 1 drop into both eyes at bedtime.    [provider]  nitroGLYCERIN (NITROSTAT) 0.4 MG SL tablet Place 1 tablet (0.4 mg total) under the tongue every 5 (five) minutes as needed for chest pain. 10/12/17   [name], [name], MD  terbinafine (LAMISIL) 250 MG tablet Take by mouth daily. 08/10/17   [provider]  timolol (BETIMOL) 0.5 % ophthalmic solution Place 1 drop into both eyes daily.    [provider]     ALLERGIES:  Allergies  Allergen Reactions  . Codeine Nausea And Vomiting    SOCIAL HISTORY:  Social History   Socioeconomic History  . Marital status: Divorced    Spouse name: Not on file  . Number of  children: Not on file  . Years of education: Not on file  . Highest education level: Not on file  Occupational History  . Not on file  Social Needs  . Financial resource strain: Not on file  . Food insecurity:    Worry: Not on file    Inability: Not on file  . Transportation needs:    Medical: Not on file    Non-medical: Not on file  Tobacco Use  . Smoking status: Never Smoker  . Smokeless tobacco: Never Used  Substance and Sexual Activity  . Alcohol use: No  . Drug use: No  . Sexual activity: Not on file  Lifestyle  . Physical activity:     Days per week: Not on file    Minutes per session: Not on file  . Stress: Not on file  Relationships  . Social connections:    Talks on phone: Not on file    Gets together: Not on file    Attends religious service: Not on file    Active member of club or organization: Not on file    Attends meetings of clubs or organizations: Not on file    Relationship status: Not on file  . Intimate partner violence:    Fear of current or ex partner: Not on file    Emotionally abused: Not on file    Physically abused: Not on file    Forced sexual activity: Not on file  Other Topics Concern  . Not on file  Social History Narrative  . Not on file    The patient currently resides (home / rehab facility / nursing home): Home The patient normally is (ambulatory / bedbound): Ambulatory  FAMILY HISTORY:  Family History  Problem Relation Age of Onset  . Heart attack Father     Otherwise negative.   REVIEW OF SYSTEMS:  Constitutional: denies any other weight loss, fever, chills, or sweats  Eyes: denies any other vision changes, history of eye injury  ENT: denies sore throat, hearing problems  Respiratory: denies shortness of breath, wheezing  Cardiovascular: denies chest pain, palpitations  Gastrointestinal: abdominal pain, N/V, and bowel function as per HPI  Genitourinary: denies burning with urination or urinary frequency Musculoskeletal: denies any other joint pains or cramps  Skin: Denies any other rashes or skin discolorations  Neurological: denies any other headache, dizziness, weakness  Psychiatric: denies any other depression, anxiety   All other review of systems were otherwise negative.  VITAL SIGNS:  Temp:  [97.9 F (36.6 C)] 97.9 F (36.6 C) (11/15 1502) Pulse Rate:  [63] 63 (11/15 1502) Resp:  [15] 15 (11/15 1502) BP: (104)/(102) 104/102 (11/15 1502) SpO2:  [100 %] 100 % (11/15 1502) Weight:  [78.9 kg] 78.9 kg (11/15 1458)     Height: 5\' 4"  (162.6 cm) Weight: 78.9 kg BMI  (Calculated): 29.85   INTAKE/OUTPUT:  This shift: No intake/output data recorded.  Last 2 shifts: @IOLAST2SHIFTS @  PHYSICAL EXAM:  Constitutional:  -- Normal body habitus  -- Awake, alert, and oriented x3, no apparent distress Eyes:  -- Pupils equally round and reactive to light  -- No scleral icterus, B/L no occular discharge Ear, nose, throat: -- Neck is FROM WNL -- No jugular venous distension  Pulmonary:  -- No wheezes or rhales -- Equal breath sounds bilaterally -- Breathing non-labored at rest Cardiovascular:  -- S1, S2 present  -- No pericardial rubs  Gastrointestinal:  -- Abdomen soft, nontender, non-distended, no guarding or rebound tenderness -- Firm mildly  tender Right groin mass, consistent with incarcerated hernia containing dilated small bowel with immediate relief upon reduction -- No other abdominal masses appreciated, pulsatile or otherwise  Musculoskeletal and Integumentary:  -- Wounds or skin discoloration: None appreciated -- Extremities: B/L UE and LE FROM, hands and feet warm, no edema  Neurologic:  -- Motor function: Intact and symmetric -- Sensation: Intact and symmetric Psychiatric:  -- Mood and affect WNL  Labs:  CBC Latest Ref Rng & Units 11/25/2017 06/11/2017 01/10/2016  WBC 4.0 - 10.5 K/uL 7.6 8.8 8.1  Hemoglobin 12.0 - 15.0 g/dL 16.1 09.6 04.5  Hematocrit 36.0 - 46.0 % 43.3 44.4 44.4  Platelets 150 - 400 K/uL 211 193 245   CMP Latest Ref Rng & Units 11/25/2017 06/11/2017 01/10/2016  Glucose 70 - 99 mg/dL 409(W) 119(J) 478(G)  BUN 8 - 23 mg/dL 15 95(A) 17  Creatinine 0.44 - 1.00 mg/dL 2.13 0.86 5.78  Sodium 135 - 145 mmol/L 136 131(L) 133(L)  Potassium 3.5 - 5.1 mmol/L 4.2 4.1 4.3  Chloride 98 - 111 mmol/L 102 100(L) 102  CO2 22 - 32 mmol/L 25 20(L) 23  Calcium 8.9 - 10.3 mg/dL 9.3 9.0 9.0  Total Protein 6.5 - 8.1 g/dL 7.8 - -  Total Bilirubin 0.3 - 1.2 mg/dL 0.8 - -  Alkaline Phos 38 - 126 U/L 83 - -  AST 15 - 41 U/L 22 - -  ALT 0 - 44  U/L 14 - -   Imaging studies:  CT Abdomen and Pelvis with Contrast (11/25/2017) - personally reviewed bedside with patient 1. Right femoral hernia containing a loop of small bowel partial obstruction of the small bowel at that site, creating small-bowel obstruction. 2. 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 3. Slightly dilated fluid-filled endometrial cavity, atypical for a female of this age. No visible cervical mass.   Assessment/Plan: (ICD-10's: K41.30) 72 y.o. female with a resolved SBO upon reduction of formerly incarcerated non-recurrent Right femoral hernia, complicated by pertinent comorbidities including controlled HTN attributed at least in part to hyperaldosteronemia, HLD, and osteoarthritis.    - incarcerated Right femoral hernia reduced bedside in ED  - all risks, benefits, and alternatives to open repair of Right groin (likely femoral) hernia with mesh, possible diagnostic laparoscopy, possible small bowel resection were discussed with the patient, all of her questions were answered to her expressed satisfaction, patient expresses she wishes to proceed, and informed consent was obtained.  - considering patient ate within past 8 hours, will plan for open repair of Right groin hernia with mesh tomorrow morning  - accordingly, okay with clear liquids diet for now (considering resolving SBO) and NPO + IV fluids after MN  - considering cardiac history, discussed patient with Dr. Imogene Burn (hospitalist) for pre-op eval  - medical management of comorbidities  - monitor abdominal exam  - DVT prophylaxis  All of the above findings and recommendations were discussed with the patient, and all of her questions were answered to her expressed satisfaction.  -- Scherrie Gerlach Earlene Plater, MD, RPVI [place]: [facility] General Surgery - Partnering for exceptional care. Office: [phone]

## 2017-11-25 NOTE — ED Notes (Signed)
[facility] pt is done with contrast.

## 2017-11-25 NOTE — ED Provider Notes (Signed)
[facility] Emergency Department Provider Note   ____________________________________________    I have reviewed the triage vital signs and the nursing notes.   HISTORY  Chief Complaint Abdominal Pain     HPI Joanne Lewis is a 72 y.o. female who presents with complaints of abdominal pain.  Patient reports she felt well this morning when she woke up however later in the morning she developed pain in her right lower quadrant which she described as moderate and aching.  She went to lunch with her granddaughter and the pain became worse.  She did have a bowel movement when she got home which she states was normal.  She then noticed that she had a "lump "in her lower abdomen.  She reports the pain has worsened and is constant.  No history of the same.  No history of abdominal surgery.  Mild nausea, no vomiting.  No fever   Past Medical History:  Diagnosis Date  . 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HCC)   . Anginal pain (HCC)   . Arthritis   . Fatigue   . Hyperkalemia   . Hyperlipidemia   . Hypertension     Patient Active Problem List   Diagnosis Date Noted  . SOB (shortness of breath) 10/10/2017  . Coronary artery calcification 09/19/2016  . Chest pain 02/03/2016  . Elevated troponin 02/03/2016  . Anxiety 02/03/2016  . Essential hypertension 02/03/2016  . Mixed hyperlipidemia 02/03/2016  . Chest tightness 01/01/2016    Past Surgical History:  Procedure Laterality Date  . COLONOSCOPY WITH PROPOFOL N/A 08/09/2016   Procedure: COLONOSCOPY WITH PROPOFOL;  Surgeon: Scot Jun, MD;  Location: [facility] ENDOSCOPY;  Service: Endoscopy;  Laterality: N/A;    Prior to Admission medications   Medication Sig Start Date End Date Taking? Authorizing Provider  amLODipine (NORVASC) 10 MG tablet Take 5 mg by mouth daily.    Yes [provider]  aspirin EC 81 MG tablet Take 81 mg by mouth daily.    Yes [provider]  atorvastatin (LIPITOR)  20 MG tablet Take 20 mg by mouth every evening.    Yes [provider]  carvedilol (COREG) 3.125 MG tablet Take 1.5625 mg by mouth 2 (two) times daily with a meal.   Yes [provider]  latanoprost (XALATAN) 0.005 % ophthalmic solution Place 1 drop into both eyes at bedtime.   Yes [provider]  nitroGLYCERIN (NITROSTAT) 0.4 MG SL tablet Place 1 tablet (0.4 mg total) under the tongue every 5 (five) minutes as needed for chest pain. 10/12/17  Yes Antonieta Iba, MD  spironolactone (ALDACTONE) 50 MG tablet Take 50 mg by mouth daily.   Yes [provider]  terbinafine (LAMISIL) 250 MG tablet Take 250 mg by mouth daily.  08/10/17  Yes [provider]  timolol (BETIMOL) 0.5 % ophthalmic solution Place 1 drop into both eyes daily.   Yes [provider]  carvedilol (COREG) 3.125 MG tablet Take 1 tablet (3.125 mg total) by mouth 2 (two) times daily with a meal. Patient taking differently: Take 0.5 mg by mouth 2 (two) times daily with a meal.  01/02/16   Shaune Pollack, MD  hydrALAZINE (APRESOLINE) 50 MG tablet Take 1 tablet (50 mg total) by mouth 3 (three) times daily as needed. 09/20/16   Antonieta Iba, MD     Allergies Codeine  Family History  Problem Relation Age of Onset  . Heart attack Father     Social History  Social History   Tobacco Use  . Smoking status: Never Smoker  . Smokeless tobacco: Never Used  Substance Use Topics  . Alcohol use: No  . Drug use: No    Review of Systems  Constitutional: No fever/chills Eyes: No visual changes.  ENT: No sore throat. Cardiovascular: Denies chest pain. Respiratory: No cough Gastrointestinal: As above Genitourinary: Negative for dysuria. Musculoskeletal: Negative for back pain. Skin: Negative for rash. Neurological: Negative for headaches    ____________________________________________   PHYSICAL EXAM:  VITAL SIGNS: ED Triage Vitals  Enc Vitals Group     BP 11/25/17 1502  (!) 104/102     Pulse Rate 11/25/17 1502 63     Resp 11/25/17 1502 15     Temp 11/25/17 1502 97.9 F (36.6 C)     Temp Source 11/25/17 1502 Oral     SpO2 11/25/17 1502 100 %     Weight 11/25/17 1458 78.9 kg (174 lb)     Height 11/25/17 1458 1.626 m (5\' 4" )     Head Circumference --      Peak Flow --      Pain Score 11/25/17 1458 4     Pain Loc --      Pain Edu? --      Excl. in GC? --     Constitutional: Alert and oriented.  Eyes: Conjunctivae are normal.   Nose: No congestion/rhinnorhea.  Cardiovascular: Normal rate, regular rhythm. Grossly normal heart sounds.  Good peripheral circulation. Respiratory: Normal respiratory effort.  No retractions. Lungs CTAB. Gastrointestinal: Overall soft and nontender, no distention.  No CVA tenderness.  Firm protrusion consistent with hernia,  femoral, no erythema or peritonitis to suggest strangulation.  Unable to reduce  Musculoskeletal:   Warm and well perfused Neurologic:  Normal speech and language. No gross focal neurologic deficits are appreciated.  Skin:  Skin is warm, dry and intact. No rash noted. Psychiatric: Mood and affect are normal. Speech and behavior are normal.  ____________________________________________   LABS (all labs ordered are listed, but only abnormal results are displayed)  Labs Reviewed  COMPREHENSIVE METABOLIC PANEL - Abnormal; Notable for the following components:      Result Value   Glucose, Bld 135 (*)    All other components within normal limits  URINALYSIS, COMPLETE (UACMP) WITH MICROSCOPIC - Abnormal; Notable for the following components:   Color, Urine COLORLESS (*)    APPearance CLEAR (*)    Ketones, ur 5 (*)    All other components within normal limits  CBC  APTT  PROTIME-INR   ____________________________________________  EKG  None ____________________________________________  RADIOLOGY  CT abdomen pelvis ____________________________________________   PROCEDURES  Procedure(s)  performed: No  Procedures   Critical Care performed: No ____________________________________________   INITIAL IMPRESSION / ASSESSMENT AND PLAN / ED COURSE  Pertinent labs & imaging results that were available during my care of the patient were reviewed by me and considered in my medical decision making (see chart for details).  Patient presents with significant abdominal pain, likely related to hernia.  Unable to reduce with Trendelenburg and significant pressure.  Will obtain labs, CT, discuss with surgery.  IV morphine and IV Zofran ordered  CT scan demonstrates femoral hernia with partial obstruction.  Surgery consulted.  Discussed CT results of fluid in the endometrium with the patient and the need for close outpatient follow-up with gynecology  Dr. Earlene Plater will admit the patient    ____________________________________________   FINAL CLINICAL IMPRESSION(S) / ED DIAGNOSES  Final diagnoses:  Femoral hernia of right side with obstruction        Note:  This document was prepared using Dragon voice recognition software and may include unintentional dictation errors.    Jene EveryKinner, Sadiel Mota, MD 11/25/17 [phone]

## 2017-11-25 NOTE — ED Notes (Signed)
Pt back from CT

## 2017-11-25 NOTE — ED Notes (Signed)
Unable to call report due to protected time. Will call at 1930 

## 2017-11-26 ENCOUNTER — Observation Stay: Payer: Medicare Other | Admitting: Anesthesiology

## 2017-11-26 ENCOUNTER — Encounter
Admission: EM | Disposition: A | Discharge status: Home / Self Care | Payer: Self-pay | Source: Home / Self Care | Attending: Emergency Medicine

## 2017-11-26 DIAGNOSIS — K4130 Unilateral femoral hernia, with obstruction, without gangrene, not specified as recurrent: Secondary | ICD-10-CM

## 2017-11-26 HISTORY — PX: FEMORAL HERNIA REPAIR: SHX632

## 2017-11-26 LAB — CBC
HCT: 37.9 % (ref 36.0–46.0)
Hemoglobin: 12.8 g/dL (ref 12.0–15.0)
MCH: 29.8 pg (ref 26.0–34.0)
MCHC: 33.8 g/dL (ref 30.0–36.0)
MCV: 88.3 fL (ref 80.0–100.0)
PLATELETS: 182 10*3/uL (ref 150–400)
RBC: 4.29 MIL/uL (ref 3.87–5.11)
RDW: 12 % (ref 11.5–15.5)
WBC: 5.7 10*3/uL (ref 4.0–10.5)
nRBC: 0 % (ref 0.0–0.2)

## 2017-11-26 LAB — BASIC METABOLIC PANEL
ANION GAP: 9 (ref 5–15)
BUN: 11 mg/dL (ref 8–23)
CALCIUM: 8.7 mg/dL — AB (ref 8.9–10.3)
CO2: 25 mmol/L (ref 22–32)
Chloride: 101 mmol/L (ref 98–111)
Creatinine, Ser: 0.68 mg/dL (ref 0.44–1.00)
GLUCOSE: 110 mg/dL — AB (ref 70–99)
Potassium: 3.6 mmol/L (ref 3.5–5.1)
SODIUM: 135 mmol/L (ref 135–145)

## 2017-11-26 LAB — SURGICAL PCR SCREEN
MRSA, PCR: NEGATIVE
Staphylococcus aureus: NEGATIVE

## 2017-11-26 SURGERY — HERNIA REPAIR FEMORAL
Anesthesia: General | Laterality: Right

## 2017-11-26 MED ORDER — SUGAMMADEX SODIUM 200 MG/2ML IV SOLN
INTRAVENOUS | Status: DC | PRN
  Administered 2017-11-26: 200 mg via INTRAVENOUS

## 2017-11-26 MED ORDER — PROPOFOL 10 MG/ML IV BOLUS
INTRAVENOUS | Status: DC | PRN
  Administered 2017-11-26: 120 mg via INTRAVENOUS

## 2017-11-26 MED ORDER — ROCURONIUM BROMIDE 100 MG/10ML IV SOLN
INTRAVENOUS | Status: DC | PRN
  Administered 2017-11-26: 40 mg via INTRAVENOUS

## 2017-11-26 MED ORDER — OXYCODONE HCL 5 MG PO TABS: 5.0000 mg | ORAL_TABLET | ORAL | Status: DC | PRN

## 2017-11-26 MED ORDER — ENOXAPARIN SODIUM 40 MG/0.4ML ~~LOC~~ SOLN: 40.0000 mg | SUBCUTANEOUS | Status: DC

## 2017-11-26 MED ORDER — ONDANSETRON HCL 4 MG/2ML IJ SOLN: 4.0000 mg | Freq: Once | INTRAMUSCULAR | Status: DC | PRN

## 2017-11-26 MED ORDER — ONDANSETRON HCL 4 MG/2ML IJ SOLN
INTRAMUSCULAR | Status: DC | PRN
  Administered 2017-11-26: 4 mg via INTRAVENOUS

## 2017-11-26 MED ORDER — FENTANYL CITRATE (PF) 100 MCG/2ML IJ SOLN
INTRAMUSCULAR | Status: AC
  Filled 2017-11-26: qty 2

## 2017-11-26 MED ORDER — CEFAZOLIN SODIUM-DEXTROSE 2-4 GM/100ML-% IV SOLN
2.0000 g | Freq: Three times a day (TID) | INTRAVENOUS | Status: DC
  Filled 2017-11-26: qty 100

## 2017-11-26 MED ORDER — LIDOCAINE HCL 1 % IJ SOLN
INTRAMUSCULAR | Status: DC | PRN
  Administered 2017-11-26: 20 mL

## 2017-11-26 MED ORDER — MUPIROCIN 2 % EX OINT
1.0000 "application " | TOPICAL_OINTMENT | Freq: Two times a day (BID) | CUTANEOUS | Status: DC
  Filled 2017-11-26: qty 22

## 2017-11-26 MED ORDER — MORPHINE SULFATE (PF) 2 MG/ML IV SOLN: 2.0000 mg | INTRAVENOUS | Status: DC | PRN

## 2017-11-26 MED ORDER — ACETAMINOPHEN 500 MG PO TABS: 1000.0000 mg | ORAL_TABLET | ORAL | Status: DC

## 2017-11-26 MED ORDER — OXYCODONE-ACETAMINOPHEN 5-325 MG PO TABS: 1.0000 | ORAL_TABLET | ORAL | 0 refills | Status: DC | PRN

## 2017-11-26 MED ORDER — ACETAMINOPHEN 10 MG/ML IV SOLN
INTRAVENOUS | Status: DC | PRN
  Administered 2017-11-26: 1000 mg via INTRAVENOUS

## 2017-11-26 MED ORDER — LACTATED RINGERS IV SOLN
INTRAVENOUS | Status: DC | PRN
  Administered 2017-11-26: 08:00:00 via INTRAVENOUS

## 2017-11-26 MED ORDER — ONDANSETRON 4 MG PO TBDP: 4.0000 mg | ORAL_TABLET | Freq: Four times a day (QID) | ORAL | Status: DC | PRN

## 2017-11-26 MED ORDER — DEXAMETHASONE SODIUM PHOSPHATE 10 MG/ML IJ SOLN
INTRAMUSCULAR | Status: DC | PRN
  Administered 2017-11-26: 10 mg via INTRAVENOUS

## 2017-11-26 MED ORDER — LIDOCAINE HCL (PF) 2 % IJ SOLN
INTRAMUSCULAR | Status: AC
  Filled 2017-11-26: qty 10

## 2017-11-26 MED ORDER — LIDOCAINE HCL (PF) 1 % IJ SOLN
INTRAMUSCULAR | Status: AC
  Filled 2017-11-26: qty 30

## 2017-11-26 MED ORDER — EPHEDRINE SULFATE 50 MG/ML IJ SOLN
INTRAMUSCULAR | Status: DC | PRN
  Administered 2017-11-26: 15 mg via INTRAVENOUS
  Administered 2017-11-26: 10 mg via INTRAVENOUS

## 2017-11-26 MED ORDER — FENTANYL CITRATE (PF) 100 MCG/2ML IJ SOLN
INTRAMUSCULAR | Status: DC | PRN
  Administered 2017-11-26: 50 ug via INTRAVENOUS

## 2017-11-26 MED ORDER — DEXAMETHASONE SODIUM PHOSPHATE 10 MG/ML IJ SOLN
INTRAMUSCULAR | Status: AC
  Filled 2017-11-26: qty 1

## 2017-11-26 MED ORDER — BUPIVACAINE HCL (PF) 0.5 % IJ SOLN
INTRAMUSCULAR | Status: AC
  Filled 2017-11-26: qty 30

## 2017-11-26 MED ORDER — FENTANYL CITRATE (PF) 100 MCG/2ML IJ SOLN: 25.0000 ug | INTRAMUSCULAR | Status: DC | PRN

## 2017-11-26 MED ORDER — LIDOCAINE HCL (CARDIAC) PF 100 MG/5ML IV SOSY
PREFILLED_SYRINGE | INTRAVENOUS | Status: DC | PRN
  Administered 2017-11-26: 100 mg via INTRAVENOUS

## 2017-11-26 MED ORDER — ONDANSETRON HCL 4 MG/2ML IJ SOLN: 4.0000 mg | Freq: Four times a day (QID) | INTRAMUSCULAR | Status: DC | PRN

## 2017-11-26 MED ORDER — ACETAMINOPHEN 500 MG PO TABS
1000.0000 mg | ORAL_TABLET | Freq: Four times a day (QID) | ORAL | Status: DC
  Administered 2017-11-26: 1000 mg via ORAL
  Filled 2017-11-26: qty 2

## 2017-11-26 MED ORDER — ACETAMINOPHEN 10 MG/ML IV SOLN
INTRAVENOUS | Status: AC
  Filled 2017-11-26: qty 100

## 2017-11-26 MED ORDER — ONDANSETRON HCL 4 MG/2ML IJ SOLN
INTRAMUSCULAR | Status: AC
  Filled 2017-11-26: qty 2

## 2017-11-26 MED ORDER — CEFAZOLIN SODIUM-DEXTROSE 2-4 GM/100ML-% IV SOLN
2.0000 g | INTRAVENOUS | Status: AC
  Administered 2017-11-26: 2 g via INTRAVENOUS
  Filled 2017-11-26: qty 100

## 2017-11-26 MED ORDER — PROPOFOL 10 MG/ML IV BOLUS
INTRAVENOUS | Status: AC
  Filled 2017-11-26: qty 20

## 2017-11-26 SURGICAL SUPPLY — 33 items
BLADE SURG 15 STRL LF DISP TIS (BLADE) ×1 IMPLANT
BLADE SURG 15 STRL SS (BLADE) ×1
CHLORAPREP W/TINT 26ML (MISCELLANEOUS) ×2 IMPLANT
COVER WAND RF STERILE (DRAPES) IMPLANT
DECANTER SPIKE VIAL GLASS SM (MISCELLANEOUS) IMPLANT
DERMABOND ADVANCED (GAUZE/BANDAGES/DRESSINGS) ×1
DERMABOND ADVANCED .7 DNX12 (GAUZE/BANDAGES/DRESSINGS) ×1 IMPLANT
DRAIN PENROSE 5/8X18 LTX STRL (WOUND CARE) IMPLANT
DRAPE LAPAROTOMY 77X122 PED (DRAPES) ×2 IMPLANT
ELECT REM PT RETURN 9FT ADLT (ELECTROSURGICAL) ×2
ELECTRODE REM PT RTRN 9FT ADLT (ELECTROSURGICAL) ×1 IMPLANT
GLOVE BIO SURGEON STRL SZ7 (GLOVE) ×6 IMPLANT
GLOVE BIOGEL PI IND STRL 7.5 (GLOVE) ×1 IMPLANT
GLOVE BIOGEL PI INDICATOR 7.5 (GLOVE) ×1
GOWN STRL REUS W/ TWL LRG LVL3 (GOWN DISPOSABLE) ×2 IMPLANT
GOWN STRL REUS W/TWL LRG LVL3 (GOWN DISPOSABLE) ×2
KIT TURNOVER KIT A (KITS) ×2 IMPLANT
MESH MARLEX PLUG MEDIUM (Mesh General) ×2 IMPLANT
NEEDLE HYPO 22GX1.5 SAFETY (NEEDLE) ×2 IMPLANT
NS IRRIG 500ML POUR BTL (IV SOLUTION) ×2 IMPLANT
PACK BASIN MINOR ARMC (MISCELLANEOUS) ×2 IMPLANT
SUT ETHIBOND CT1 BRD 2-0 30IN (SUTURE) ×2 IMPLANT
SUT ETHIBOND NAB MO 7 #0 18IN (SUTURE) IMPLANT
SUT MNCRL AB 4-0 PS2 18 (SUTURE) ×2 IMPLANT
SUT SILK 2 0 (SUTURE)
SUT SILK 2-0 18XBRD TIE 12 (SUTURE) IMPLANT
SUT VIC AB 2-0 CT1 27 (SUTURE) ×1
SUT VIC AB 2-0 CT1 TAPERPNT 27 (SUTURE) ×1 IMPLANT
SUT VIC AB 3-0 54X BRD REEL (SUTURE) IMPLANT
SUT VIC AB 3-0 BRD 54 (SUTURE)
SUT VIC AB 3-0 SH 27 (SUTURE) ×1
SUT VIC AB 3-0 SH 27X BRD (SUTURE) ×1 IMPLANT
SYR 10ML LL (SYRINGE) ×2 IMPLANT

## 2017-11-26 NOTE — Op Note (Signed)
SURGICAL OPERATIVE REPORT  DATE OF PROCEDURE: 11/26/2017  ATTENDING Surgeon(s): Ancil Linseyavis,  Evan, MD  ANESTHESIA: GETA  PRE-OPERATIVE DIAGNOSIS: Incarcerated Right femoral hernia with resolved SBO upon reduction of bowel-containing Right femoral hernia (icd-10: K41.30)  POST-OPERATIVE DIAGNOSIS: Incarcerated Right femoral hernia with resolved SBO upon reduction of bowel-containing Right femoral hernia (icd-10: K41.30)  PROCEDURE(S):  1.) Repair of incarcerated Right femoral hernia with mesh (cpt: 16109: 49553)  INTRAOPERATIVE FINDINGS: Incarcerated Right femoral hernia containing peritoneal fat at the time of surgery  INTRAVENOUS FLUIDS: 600 mL crystalloid   ESTIMATED BLOOD LOSS: Minimal (<20 mL)  URINE OUTPUT: No foley  SPECIMENS: None  IMPLANTS: Bard Medium PerFix mesh plug  DRAINS: None   COMPLICATIONS: None apparent   CONDITION AT END OF PROCEDURE: Hemodynamically stable and extubated   DISPOSITION OF PATIENT: PACU  INDICATIONS FOR PROCEDURE:   Patient is a 72 y.o. female who [facility] ED last night for RLQ abdominal pain and feeling "bloated" x < 24 hours. She also noticed a Right groin "lump". CT demonstrated SBO attributed to incarcerated Right femoral hernia containing obstructed small intestine. Contributing factors were assessed, and operative repair was offered. All risks, benefits, and alternatives to above procedure were discussed with the patient, all of patient's questions were answered to her expressed satisfaction, and informed consent was obtained and documented. Consent also included possible diagnostic laparoscopy and possible small bowel resection, but these were not included with the operative procedure as prior to surgery the following morning (this morning), patient denied any abdominal pain, was completely non-tender, and WBC was WNL. Patient also on the morning of surgery recalled she may have twice previously experienced a similarly painful Right  groin bulge which spontaneously resolved.  DETAILS OF PROCEDURE: Patient was brought to the operating suite and appropriately identified. General anesthesia was administered along with appropriate pre-operative antibiotics, and endotracheal intubation was performed by anesthetist. In supine position, operative site was prepped and draped in the usual sterile fashion. At this time, patient was again noted to have a Right groin bulge inferior and medial to patient's inguinal ligament. Following a brief time out, local anesthetic was injected over the planned infra-inguinal incision site and initial transverse skin incision was made along a natural skin crease parallel and inferior to the inguinal ligament over the level of patient's femoral hernia using a #15 blade scalpel. This incision was then extended deep through subcutaneous tissues using electrocautery, followed by blunt dissection to until patient's Right femoral hernia sac was encountered, now containing peritoneal fat/omentum. The hernia sac and its contents were circumferentially dissected free of surrounding fascial edges, and hernia was safely able to be reduced into the peritoneal cavity. Femoral artery and vein were identified and protected, and a polypropylene mesh plug was selected and inserted into the femoral canal defect, taking care to minimize/avoid compression of patient's femoral vein. This mesh plug was then secured superiorly, inferiorly, and medially using interrupted 2-0 Ethibond suture without excessive tension and without any neurovascular structures incorporated into the repair.  Hemostasis was confirmed, additional local anesthetic was injected, and the incision was closed in multiple layers using buried interrupted 3-0 Vicryl suture to re-approximate soft tissue and dermis, after which a running 4-0 Monocryl subcuticular suture was used to re-approximate epidermis. The skin was then cleaned, dried, and sterile skin glue was  applied and allowed to dry. Patient was then safely able to be extubated, awakened, and transferred to PACU for post-operative monitoring and care.  I was present for all aspects of  the above procedure, and no operative complications were apparent.

## 2017-11-26 NOTE — Anesthesia Post-op Follow-up Note (Signed)
Anesthesia QCDR form completed.        

## 2017-11-26 NOTE — Discharge Summary (Signed)
Physician Discharge Summary  Patient ID: Joanne Lewis MRN: [id number] DOB/AGE: 1945-09-11 72 y.o.  Admit date: 11/25/2017 Discharge date: 11/26/2017  Admission Diagnoses:  Discharge Diagnoses:  Active Problems:   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  Discharged Condition: good  Hospital Course: 72 y.o. female presented to [facility] ED for abdominal pain and feeling "bloated". Workup was found to be significant for exam and CT imaging demonstrating a SBO attributed to an incarcerated bowel-containing Right femoral hernia, which was reduced in the ED. Patient was admitted for observation overnight with plans to repair patient's Right femoral hernia the following morning. Informed consent was obtained and documented, and patient underwent uneventful open repair of Right femoral hernia with mesh Earlene Plater, 11/26/2017).  Post-operatively, patient's pain improved/resolved, and advancement of patient's diet and ambulation were well-tolerated. The remainder of patient's hospital course was essentially unremarkable, and discharge planning was initiated accordingly with patient safely able to be discharged home with appropriate discharge instructions, pain control, and outpatient surgical follow-up after all of her and family's questions were answered to their expressed satisfaction.  Consults: Hospitalist  Significant Diagnostic Studies: radiology: CT scan: SBO attributed to incarcerated Right femoral hernia  Treatments: surgery: Open repair of incarcerated Right femoral hernia Earlene Plater, 11/26/2017)  Discharge Exam: Blood pressure (!) 127/92, pulse 60, temperature (!) 97.3 F (36.3 C), resp. rate 18, height 5\' 4"  (1.626 m), weight 78.9 kg, SpO2 100 %. General appearance: alert, cooperative and no distress GI: abdomen soft, non-tender, nad non-distended with Right groin incision well-approximated without any appreciable peri-incisinoal erythema or drainage  Disposition:  Discharge disposition: 01-Home or Self Care        Allergies as of 11/26/2017      Reactions   Codeine Diarrhea, Nausea And Vomiting      Medication List    TAKE these medications   amLODipine 10 MG tablet Commonly known as:  NORVASC Take 5 mg by mouth daily.   aspirin EC 81 MG tablet Take 81 mg by mouth daily.   atorvastatin 20 MG tablet Commonly known as:  LIPITOR Take 20 mg by mouth every evening.   carvedilol 3.125 MG tablet Commonly known as:  COREG Take 1.5625 mg by mouth 2 (two) times daily with a meal. What changed:  Another medication with the same name was changed. Make sure you understand how and when to take each.   carvedilol 3.125 MG tablet Commonly known as:  COREG Take 1 tablet (3.125 mg total) by mouth 2 (two) times daily with a meal. What changed:  how much to take   hydrALAZINE 50 MG tablet Commonly known as:  APRESOLINE Take 1 tablet (50 mg total) by mouth 3 (three) times daily as needed.   latanoprost 0.005 % ophthalmic solution Commonly known as:  XALATAN Place 1 drop into both eyes at bedtime.   nitroGLYCERIN 0.4 MG SL tablet Commonly known as:  NITROSTAT Place 1 tablet (0.4 mg total) under the tongue every 5 (five) minutes as needed for chest pain.   oxyCODONE-acetaminophen 5-325 MG tablet Commonly known as:  PERCOCET/ROXICET Take 1 tablet by mouth every 4 (four) hours as needed for severe pain.   spironolactone 50 MG tablet Commonly known as:  ALDACTONE Take 50 mg by mouth daily.   terbinafine 250 MG tablet Commonly known as:  LAMISIL Take 250 mg by mouth daily.   timolol 0.5 % ophthalmic solution Commonly known as:  BETIMOL Place 1 drop into both eyes daily.  Follow-up Information    Ancil Linseyavis, Jason Evan, MD. Schedule an appointment as soon as possible for a visit in 2 week(s).   Specialty:  General Surgery Contact information: [address] [place] [address] [phone]            Signed: Ancil LinseyJason Evan Davis 11/26/2017, 1:04 PM

## 2017-11-26 NOTE — Anesthesia Procedure Notes (Signed)
Procedure Name: Intubation Date/Time: 11/26/2017 8:15 AM Performed by: Estanislado EmmsShort, Shazia Mitchener L, CRNA Pre-anesthesia Checklist: Patient identified, Patient being monitored, Timeout performed, Emergency Drugs available and Suction available Patient Re-evaluated:Patient Re-evaluated prior to induction Oxygen Delivery Method: Circle system utilized Preoxygenation: Pre-oxygenation with 100% oxygen Induction Type: IV induction Ventilation: Mask ventilation without difficulty Laryngoscope Size: Miller and 2 Grade View: Grade II Tube type: Oral Tube size: 7.0 mm Number of attempts: 1 Airway Equipment and Method: Stylet Placement Confirmation: ETT inserted through vocal cords under direct vision,  positive ETCO2 and breath sounds checked- equal and bilateral Secured at: 21 cm Tube secured with: Tape Dental Injury: Teeth and Oropharynx as per pre-operative assessment

## 2017-11-26 NOTE — Transfer of Care (Signed)
Immediate Anesthesia Transfer of Care Note  Patient: Joanne Lewis  Procedure(s) Performed: HERNIA REPAIR FEMORAL (Right )  Patient Location: PACU  Anesthesia Type:General  Level of Consciousness: awake, alert  and oriented  Airway & Oxygen Therapy: Patient Spontanous Breathing and Patient connected to nasal cannula oxygen  Post-op Assessment: Report given to RN and Post -op Vital signs reviewed and stable  Post vital signs: Reviewed and stable  Last Vitals:  Vitals Value Taken Time  BP 125/47 11/26/2017  9:38 AM  Temp    Pulse 66 11/26/2017  9:38 AM  Resp 11 11/26/2017  9:38 AM  SpO2 97 % 11/26/2017  9:38 AM  Vitals shown include unvalidated device data.  Last Pain:  Vitals:   11/26/17 0450  TempSrc: Oral  PainSc:          Complications: No apparent anesthesia complications

## 2017-11-26 NOTE — Discharge Instructions (Signed)
In addition to included general post-operative instructions for Open Repair of Right Femoral Hernia with mesh,  Diet: Resume home heart healthy diet.   Activity: No heavy lifting >15 - 20 pounds (children, pets, laundry, garbage) or strenuous activity until follow-up, but light activity and walking are encouraged. Do not drive or drink alcohol if taking narcotic pain medications.  Wound care: 2 days after surgery (Monday, 11/18), you may shower/get incision wet with soapy water and pat dry (do not rub incisions), but no baths or submerging incision underwater until follow-up.   Medications: Resume all home medications. For mild to moderate pain: acetaminophen (Tylenol) or ibuprofen/naproxen (if no kidney disease). Combining Tylenol with alcohol can substantially increase your risk of causing liver disease. Narcotic pain medications, if prescribed, can be used for severe pain, though may cause nausea, constipation, and drowsiness. Do not combine Tylenol and Percocet (or similar) within a 6 hour period as Percocet (and similar) contain(s) Tylenol. If you do not need the narcotic pain medication, you do not need to fill the prescription.  Call office [phone]) at any time if any questions, worsening pain, fevers/chills, bleeding, drainage from incision site, or other concerns.

## 2017-11-28 ENCOUNTER — Encounter: Payer: Self-pay | Admitting: Surgery

## 2017-11-28 ENCOUNTER — Telehealth: Payer: Self-pay | Admitting: Surgery

## 2017-11-28 NOTE — Telephone Encounter (Signed)
Spoke with patient and she does not have much discomfort. She did not want to take the prescription pain medication and I told her that was fine. She has been taking 2 extra strength Tylenol two to three times a day. I let her know that was fine as long as she was comfortable. She may reduce the amount depending on her comfort level. She is aware to call with any further questions.

## 2017-11-28 NOTE — Telephone Encounter (Signed)
Patient had surgery with Dr Earlene Plateravis on 11/26/17- femoral hernia repair. Patient has questions on how much pain medication she can take and how long she should take the medication. Patient is only taking tylenol at this time. I did inform her that she could rotate tylenol and ibuprofen, however she seemed very confused and I informed her it would be best to speak with the nurse to advise of specific milligrams and instructions.  Please call and advise.

## 2017-12-01 NOTE — Anesthesia Postprocedure Evaluation (Signed)
Anesthesia Post Note  Patient: Joanne Lewis  Procedure(s) Performed: HERNIA REPAIR FEMORAL with insertion or mesh (Right )  Patient location during evaluation: PACU Anesthesia Type: General Level of consciousness: awake and alert Pain management: pain level controlled Vital Signs Assessment: post-procedure vital signs reviewed and stable Respiratory status: spontaneous breathing, nonlabored ventilation, respiratory function stable and patient connected to nasal cannula oxygen Cardiovascular status: blood pressure returned to baseline and stable Postop Assessment: no apparent nausea or vomiting Anesthetic complications: no     Last Vitals:  Vitals:   11/26/17 1048 11/26/17 1300  BP: (!) 127/92 (!) 136/92  Pulse: 60 70  Resp: 18 16  Temp:    SpO2: 100% 98%    Last Pain:  Vitals:   11/26/17 1300  TempSrc:   PainSc: 0-No pain                 Yevette EdwardsJames G Adams

## 2017-12-13 ENCOUNTER — Other Ambulatory Visit: Payer: Self-pay

## 2017-12-13 ENCOUNTER — Encounter: Payer: Self-pay | Admitting: Surgery

## 2017-12-13 ENCOUNTER — Ambulatory Visit (INDEPENDENT_AMBULATORY_CARE_PROVIDER_SITE_OTHER): Payer: Medicare Other | Admitting: Surgery

## 2017-12-13 VITALS — BP 175/82 | HR 54 | Temp 98.0°F | Ht 64.0 in | Wt 163.0 lb

## 2017-12-13 DIAGNOSIS — K4130 Unilateral femoral hernia, with obstruction, without gangrene, not specified as recurrent: Secondary | ICD-10-CM

## 2017-12-13 DIAGNOSIS — Z4889 Encounter for other specified surgical aftercare: Secondary | ICD-10-CM

## 2017-12-13 NOTE — Patient Instructions (Addendum)
Patient is to return to the office as needed.  Call the office with any questions or concerns. 

## 2017-12-13 NOTE — Progress Notes (Signed)
Surgical Clinic Progress/Follow-up Note   HPI:  72 y.o. Female presents to clinic for post-op follow-up 17 Days s/p open mesh repair of incarcerated Right femoral hernia to which SBO was attributed and relieved upon reduction Joanne Lewis, 11/26/2017). Patient reports complete resolution of pre-operative pain/swelling and has been tolerating regular diet with +flatus and normal BM's, denies N/V, fever/chills, CP, or SOB. Patient likewise denies post-surgical pain and says she never filled her narcotic prescription.  Review of Systems:  Constitutional: denies fever/chills  Respiratory: denies shortness of breath, wheezing  Cardiovascular: denies chest pain, palpitations  Gastrointestinal: abdominal pain, N/V, and bowel function as per interval history Skin: Denies any other rashes or skin discolorations except post-surgical wounds as per interval history  Vital Signs:  BP (!) 175/82   Pulse (!) 54   Temp 98 F (36.7 C) (Skin)   Ht 5\' 4"  (1.626 m)   Wt 163 lb (73.9 kg)   SpO2 98%   BMI 27.98 kg/m    Physical Exam:  Constitutional:  -- Normal body habitus  -- Awake, alert, and oriented x3  Pulmonary:  -- No crackles -- Equal breath sounds bilaterally -- Breathing non-labored at rest Cardiovascular:  -- S1, S2 present  -- No pericardial rubs  Gastrointestinal:  -- Soft and non-distended, non-tender to palpation, no guarding/rebound tenderness -- Post-surgical incisions all well-approximated without any peri-incisional erythema or drainage -- No abdominal masses appreciated, pulsatile or otherwise  Musculoskeletal / Integumentary:  -- Wounds or skin discoloration: None appreciated except post-surgical incisions as described above (GI) -- Extremities: B/L UE and LE FROM, hands and feet warm, no edema   Imaging: No new pertinent imaging available for review  Assessment:  72 y.o. yo Female with a problem list including...  Patient Active Problem List   Diagnosis Date Noted  .  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 SOB (shortness of breath) 10/10/2017  . Coronary artery calcification 09/19/2016  . Chest pain 02/03/2016  . Elevated troponin 02/03/2016  . Anxiety 02/03/2016  . Essential hypertension 02/03/2016  . Mixed hyperlipidemia 02/03/2016  . Chest tightness 01/01/2016    presents to clinic for post-op follow-up evaluation, doing well 17 Days s/p open mesh repair of incarcerated Right femoral hernia to which SBO was attributed and relieved upon reduction Joanne Lewis, 11/26/2017).  Plan:              - advance diet as tolerated              - okay to submerge incisions under water (baths, swimming) prn             - no heavy lifting >40 lbs x 4 more weeks, after which may gradually resume all activities without restrictions             - apply sunblock particularly to incisions with sun exposure to reduce pigmentation of scars             - return to clinic as needed, instructed to call office if any questions or concerns  All of the above recommendations were discussed with the patient, and all of patient's questions were answered to her expressed satisfaction.  -- Scherrie GerlachJason E. Joanne Plateravis, MD, RPVI [place]: [facility] General Surgery - Partnering for exceptional care. Office: [phone]

## 2017-12-14 ENCOUNTER — Encounter: Payer: Self-pay | Admitting: Surgery

## 2017-12-30 ENCOUNTER — Telehealth: Payer: Self-pay | Admitting: Cardiovascular Disease

## 2017-12-30 NOTE — Telephone Encounter (Signed)
Routing to Dr Mariah MillingGollan to review.

## 2017-12-30 NOTE — Telephone Encounter (Signed)
Patient calling  States that she recently had hernia surgery and on the CT scan prior she noticed an abdominal aortic aneurysm Would like for Dr Mariah MillingGollan to compare with the calcium scan  Patient would like to know if it is something that should be followed up with immediately or just keep an eye on Please call to discuss

## 2017-12-31 NOTE — Telephone Encounter (Signed)
I do not see "abdominal aortic aneurysm" on any CT scan Only little bit of "atherosclerosis in aorta", as in the heart, very little No monitoring needed, Just stay on lipitor

## 2018-01-02 NOTE — Telephone Encounter (Signed)
Call to patient regarding results of CT imaging that was reviewed by Dr. Mariah MillingGollan.  His notes were relayed to patient. She was agreeable to POC and was appreciative of call.    Advised pt to call for any further questions or concerns

## 2018-02-01 ENCOUNTER — Other Ambulatory Visit: Payer: Self-pay

## 2018-02-01 ENCOUNTER — Emergency Department: Payer: Medicare Other

## 2018-02-01 ENCOUNTER — Emergency Department
Admission: EM | Admit: 2018-02-01 | Discharge: 2018-02-01 | Disposition: A | Discharge status: Home / Self Care | Payer: Medicare Other | Attending: Emergency Medicine | Admitting: Emergency Medicine

## 2018-02-01 DIAGNOSIS — Z79899 Other long term (current) drug therapy: Secondary | ICD-10-CM | POA: Diagnosis not present

## 2018-02-01 DIAGNOSIS — Z7982 Long term (current) use of aspirin: Secondary | ICD-10-CM | POA: Diagnosis not present

## 2018-02-01 DIAGNOSIS — R42 Dizziness and giddiness: Secondary | ICD-10-CM | POA: Diagnosis present

## 2018-02-01 DIAGNOSIS — R531 Weakness: Secondary | ICD-10-CM | POA: Diagnosis not present

## 2018-02-01 DIAGNOSIS — I10 Essential (primary) hypertension: Secondary | ICD-10-CM | POA: Diagnosis not present

## 2018-02-01 LAB — URINALYSIS, COMPLETE (UACMP) WITH MICROSCOPIC
Bilirubin Urine: NEGATIVE
Glucose, UA: NEGATIVE mg/dL
Hgb urine dipstick: NEGATIVE
Ketones, ur: NEGATIVE mg/dL
Leukocytes, UA: NEGATIVE
Nitrite: NEGATIVE
Protein, ur: NEGATIVE mg/dL
Specific Gravity, Urine: 1.003 — ABNORMAL LOW (ref 1.005–1.030)
pH: 6 (ref 5.0–8.0)

## 2018-02-01 LAB — COMPREHENSIVE METABOLIC PANEL
ALT: 18 U/L (ref 0–44)
AST: 23 U/L (ref 15–41)
Albumin: 4.4 g/dL (ref 3.5–5.0)
Alkaline Phosphatase: 107 U/L (ref 38–126)
Anion gap: 8 (ref 5–15)
BUN: 30 mg/dL — AB (ref 8–23)
CO2: 23 mmol/L (ref 22–32)
CREATININE: 0.83 mg/dL (ref 0.44–1.00)
Calcium: 8.9 mg/dL (ref 8.9–10.3)
Chloride: 104 mmol/L (ref 98–111)
GFR calc Af Amer: >60 mL/min (ref >60)
GFR calc non Af Amer: >60 mL/min (ref >60)
Glucose, Bld: 109 mg/dL — ABNORMAL HIGH (ref 70–99)
Potassium: 4.4 mmol/L (ref 3.5–5.1)
Sodium: 135 mmol/L (ref 135–145)
Total Bilirubin: 0.7 mg/dL (ref 0.3–1.2)
Total Protein: 7.5 g/dL (ref 6.5–8.1)

## 2018-02-01 LAB — CBC WITH DIFFERENTIAL/PLATELET
Abs Immature Granulocytes: 0.03 10*3/uL (ref 0.00–0.07)
Basophils Absolute: 0.1 10*3/uL (ref 0.0–0.1)
Basophils Relative: 1 %
Eosinophils Absolute: 0.3 10*3/uL (ref 0.0–0.5)
Eosinophils Relative: 3 %
HCT: 41.6 % (ref 36.0–46.0)
Hemoglobin: 13.7 g/dL (ref 12.0–15.0)
Immature Granulocytes: 0 %
LYMPHS ABS: 1.4 10*3/uL (ref 0.7–4.0)
Lymphocytes Relative: 18 %
MCH: 29.8 pg (ref 26.0–34.0)
MCHC: 32.9 g/dL (ref 30.0–36.0)
MCV: 90.6 fL (ref 80.0–100.0)
MONOS PCT: 9 %
Monocytes Absolute: 0.7 10*3/uL (ref 0.1–1.0)
Neutro Abs: 5.4 10*3/uL (ref 1.7–7.7)
Neutrophils Relative %: 69 %
Platelets: 228 10*3/uL (ref 150–400)
RBC: 4.59 MIL/uL (ref 3.87–5.11)
RDW: 12.6 % (ref 11.5–15.5)
WBC: 7.9 10*3/uL (ref 4.0–10.5)
nRBC: 0 % (ref 0.0–0.2)

## 2018-02-01 LAB — TROPONIN I: Troponin I: <0.03 ng/mL (ref <0.03)

## 2018-02-01 MED ORDER — SODIUM CHLORIDE 0.9 % IV BOLUS
1000.0000 mL | Freq: Once | INTRAVENOUS | Status: AC
  Administered 2018-02-01: 1000 mL via INTRAVENOUS

## 2018-02-01 MED ORDER — LORAZEPAM 2 MG/ML IJ SOLN
1.0000 mg | Freq: Once | INTRAMUSCULAR | Status: AC
  Administered 2018-02-01: 1 mg via INTRAVENOUS
  Filled 2018-02-01: qty 1

## 2018-02-01 NOTE — ED Notes (Signed)
Patient transported to CT 

## 2018-02-01 NOTE — Discharge Instructions (Signed)
Fortunately today your EKG, your lab work, and your CT scan were very reassuring.  Please take all of your home medications as prescribed and follow up with your PMD this coming Monday for a recheck.  Return to the ED sooner for any concerns.  It was a pleasure to take care of you today, and thank you for coming to our emergency department.  If you have any questions or concerns before leaving please ask the nurse to grab me and I'm more than happy to go through your aftercare instructions again.  If you were prescribed any opioid pain medication today such as Norco, Vicodin, Percocet, morphine, hydrocodone, or oxycodone please make sure you do not drive when you are taking this medication as it can alter your ability to drive safely.  If you have any concerns once you are home that you are not improving or are in fact getting worse before you can make it to your follow-up appointment, please do not hesitate to call 911 and come back for further evaluation.  Merrily BrittleNeil Everlyn Farabaugh, MD  Results for orders placed or performed during the hospital encounter of 02/01/18  Comprehensive metabolic panel  Result Value Ref Range   Sodium 135 135 - 145 mmol/L   Potassium 4.4 3.5 - 5.1 mmol/L   Chloride 104 98 - 111 mmol/L   CO2 23 22 - 32 mmol/L   Glucose, Bld 109 (H) 70 - 99 mg/dL   BUN 30 (H) 8 - 23 mg/dL   Creatinine, Ser 9.600.83 0.44 - 1.00 mg/dL   Calcium 8.9 8.9 - 45.410.3 mg/dL   Total Protein 7.5 6.5 - 8.1 g/dL   Albumin 4.4 3.5 - 5.0 g/dL   AST 23 15 - 41 U/L   ALT 18 0 - 44 U/L   Alkaline Phosphatase 107 38 - 126 U/L   Total Bilirubin 0.7 0.3 - 1.2 mg/dL   GFR calc non Af Amer >60 >60 mL/min   GFR calc Af Amer >60 >60 mL/min   Anion gap 8 5 - 15  Troponin I - Once  Result Value Ref Range   Troponin I <0.03 <0.03 ng/mL  CBC with Differential  Result Value Ref Range   WBC 7.9 4.0 - 10.5 K/uL   RBC 4.59 3.87 - 5.11 MIL/uL   Hemoglobin 13.7 12.0 - 15.0 g/dL   HCT 09.841.6 11.936.0 - 14.746.0 %   MCV 90.6 80.0  - 100.0 fL   MCH 29.8 26.0 - 34.0 pg   MCHC 32.9 30.0 - 36.0 g/dL   RDW 82.912.6 56.211.5 - 13.015.5 %   Platelets 228 150 - 400 K/uL   nRBC 0.0 0.0 - 0.2 %   Neutrophils Relative % 69 %   Neutro Abs 5.4 1.7 - 7.7 K/uL   Lymphocytes Relative 18 %   Lymphs Abs 1.4 0.7 - 4.0 K/uL   Monocytes Relative 9 %   Monocytes Absolute 0.7 0.1 - 1.0 K/uL   Eosinophils Relative 3 %   Eosinophils Absolute 0.3 0.0 - 0.5 K/uL   Basophils Relative 1 %   Basophils Absolute 0.1 0.0 - 0.1 K/uL   Immature Granulocytes 0 %   Abs Immature Granulocytes 0.03 0.00 - 0.07 K/uL   Ct Head Wo Contrast  Result Date: 02/01/2018 CLINICAL DATA:  Focal neuro deficit; high blood pressure and "not feeling right". EXAM: CT HEAD WITHOUT CONTRAST TECHNIQUE: Contiguous axial images were obtained from the base of the skull through the vertex without intravenous contrast. COMPARISON:  None. FINDINGS: Brain:  No evidence of acute infarction, hemorrhage, hydrocephalus, extra-axial collection or mass lesion/mass effect. Vascular: Atherosclerotic calcification Skull: Normal. Negative for fracture or focal lesion. Sinuses/Orbits: Small volume secretions in the left sphenoid sinus. IMPRESSION: Negative head CT. Electronically Signed   By: Marnee Spring M.D.   On: 02/01/2018 05:26

## 2018-02-01 NOTE — ED Provider Notes (Signed)
[facility] Emergency Department Provider Note  ____________________________________________   First MD Initiated Contact with Patient 02/01/18 0451     (approximate)  I have reviewed the triage vital signs and the nursing notes.   HISTORY  Chief Complaint Dizziness   HPI [name] is a 73 y.o. female comes to the emergency department via EMS with multiple episodes of feeling "odd" and "faint" earlier today when she woke up.  She was in her usual state of health when she went to bed and she woke up feeling "not normal".  She felt like her nose was congested which is a sign to her that her blood pressure is elevated.  She checked her blood pressure noted it was 138 over "something too high" so she called 911.  EMS noted her blood pressure was slightly elevated on arrival.  Blood sugar was 149.  The patient has no symptoms right now.  She denies double vision blurred vision chest pain shortness of breath abdominal pain nausea vomiting numbness or weakness.  No headache.  No trauma.  She is currently completely asymptomatic.    Past Medical History:  Diagnosis Date  . 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HCC)   . Anginal pain (HCC)   . Arthritis   . Fatigue   . 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 Hyperkalemia   . Hyperlipidemia   . Hypertension     Patient Active Problem List   Diagnosis Date Noted  . SOB (shortness of breath) 10/10/2017  . Coronary artery calcification 09/19/2016  . Chest pain 02/03/2016  . Elevated troponin 02/03/2016  . Anxiety 02/03/2016  . Essential hypertension 02/03/2016  . Mixed hyperlipidemia 02/03/2016  . Chest tightness 01/01/2016    Past Surgical History:  Procedure Laterality Date  . COLONOSCOPY WITH PROPOFOL N/A 08/09/2016   Procedure: COLONOSCOPY WITH PROPOFOL;  Surgeon: Scot JunElliott, Robert T, MD;  Location: [facility] ENDOSCOPY;  Service: Endoscopy;  Laterality: N/A;  . FEMORAL  HERNIA REPAIR Right 11/26/2017   Procedure: HERNIA REPAIR FEMORAL with insertion or mesh;  Surgeon: Ancil Linseyavis, Jason Evan, MD;  Location: [facility] ORS;  Service: General;  Laterality: Right;    Prior to Admission medications   Medication Sig Start Date End Date Taking? Authorizing Provider  amLODipine (NORVASC) 10 MG tablet Take 5 mg by mouth daily.     [provider]  aspirin EC 81 MG tablet Take 81 mg by mouth daily.     [provider]  atorvastatin (LIPITOR) 20 MG tablet Take 20 mg by mouth every evening.     [provider]  Carboxymethylcellulose Sodium (EYE DROPS OP) Apply to eye 1 day or 1 dose.    [provider]  carvedilol (COREG) 3.125 MG tablet Take 3.125 mg by mouth 2 (two) times daily with a meal.    [provider]  latanoprost (XALATAN) 0.005 % ophthalmic solution Place 1 drop into both eyes at bedtime.    [provider]  nitroGLYCERIN (NITROSTAT) 0.4 MG SL tablet Place 1 tablet (0.4 mg total) under the tongue every 5 (five) minutes as needed for chest pain. 10/12/17   [name], [name], MD  spironolactone (ALDACTONE) 50 MG tablet Take 50 mg by mouth daily.    [provider]  terbinafine (LAMISIL) 250 MG tablet Take 250 mg by mouth daily.  08/10/17   [provider]  timolol (BETIMOL) 0.5 % ophthalmic solution Place 1 drop into both eyes daily.  [provider]    Allergies Codeine  Family History  Problem Relation Age of Onset  . Heart attack Father     Social History Social History   Tobacco Use  . Smoking status: Never Smoker  . Smokeless tobacco: Never Used  Substance Use Topics  . Alcohol use: No  . Drug use: No    Review of Systems Constitutional: No fever/chills Eyes: No visual changes. ENT: No sore throat. Cardiovascular: Denies chest pain. Respiratory: Denies shortness of breath. Gastrointestinal: No abdominal pain.  No nausea, no vomiting.  No diarrhea.  No  constipation. Genitourinary: Negative for dysuria. Musculoskeletal: Negative for back pain. Skin: Negative for rash. Neurological: Negative for headaches, focal weakness or numbness.   ____________________________________________   PHYSICAL EXAM:  VITAL SIGNS: ED Triage Vitals  Enc Vitals Group     BP      Pulse      Resp      Temp      Temp src      SpO2      Weight      Height      Head Circumference      Peak Flow      Pain Score      Pain Loc      Pain Edu?      Excl. in GC?     Constitutional: Alert and oriented x4 nontoxic no diaphoresis speaks full clear sentences Eyes: PERRL EOMI. no nystagmus midrange and brisk Head: Atraumatic. Nose: No congestion/rhinnorhea. Mouth/Throat: No trismus Neck: No stridor.   Cardiovascular: Normal rate, regular rhythm. Grossly normal heart sounds.  Good peripheral circulation. Respiratory: Normal respiratory effort.  No retractions. Lungs CTAB and moving good air Gastrointestinal: Soft nontender Musculoskeletal: No lower extremity edema   Neurologic:  Normal speech and language. No gross focal neurologic deficits are appreciated. Skin:  Skin is warm, dry and intact. No rash noted. Psychiatric: Mood and affect are normal. Speech and behavior are normal.    ____________________________________________   DIFFERENTIAL includes but not limited to  Stroke, TIA, hypertensive emergency, dehydration, urinary tract infection ____________________________________________   LABS (all labs ordered are listed, but only abnormal results are displayed)  Labs Reviewed  COMPREHENSIVE METABOLIC PANEL - Abnormal; Notable for the following components:      Result Value   Glucose, Bld 109 (*)    BUN 30 (*)    All other components within normal limits  URINALYSIS, COMPLETE (UACMP) WITH MICROSCOPIC - Abnormal; Notable for the following components:   Color, Urine COLORLESS (*)    APPearance CLEAR (*)    Specific Gravity, Urine 1.003 (*)     Bacteria, UA RARE (*)    All other components within normal limits  TROPONIN I  CBC WITH DIFFERENTIAL/PLATELET    Lab work reviewed by me with no acute disease noted __________________________________________  EKG  ED ECG REPORT I, Merrily BrittleNeil Alla Sloma, the attending physician, personally viewed and interpreted this ECG.  Date: 02/01/2018 EKG Time:  Rate: 74 Rhythm: normal sinus rhythm QRS Axis: Rightward axis Intervals: normal ST/T Wave abnormalities: normal Narrative Interpretation: no evidence of acute ischemia.  EKG unchanged compared to previous EKG 10/12/2017  ____________________________________________  RADIOLOGY  CT scan of the head reviewed by me with no acute disease ____________________________________________   PROCEDURES  Procedure(s) performed: no  Procedures  Critical Care performed: no  ____________________________________________   INITIAL IMPRESSION / ASSESSMENT AND PLAN / ED COURSE  Pertinent labs & imaging results that were available during my care  of the patient were reviewed by me and considered in my medical decision making (see chart for details).   As part of my medical decision making, I reviewed the following data within the electronic MEDICAL RECORD NUMBER History obtained from family if available, nursing notes, old chart and ekg, as well as notes from prior ED visits.  Patient comes the emergency department completely asymptomatic with multiple brief episodes of feeling "odd".  Head CT obtained given the possibility of stroke and fortunately is negative.  Neuro exam is intact.  Lab work is reassuring and urinalysis is negative.  EKG shows a right bundle which is unchanged from previous.  She is discharged home after a milligram of Ativan for her likely anxiety with significant improvement in her symptoms.  Strict return precautions have been given.      ____________________________________________   FINAL CLINICAL IMPRESSION(S) / ED  DIAGNOSES  Final diagnoses:  Weakness      NEW MEDICATIONS STARTED DURING THIS VISIT:  New Prescriptions   No medications on file     Note:  This document was prepared using Dragon voice recognition software and may include unintentional dictation errors.    Merrily Brittle, MD 02/01/18 [phone]

## 2018-02-01 NOTE — ED Triage Notes (Signed)
Pt arrives to ED from home via [facility] with c/c of feeling "faint" and "odd" upon waking this morning. EMS transport vitals: 162/110, p78, sat 99% on RA, CBG 149. EMS placed 18G in R AC. Pt A&Ox4 upon arrival. Hx of HTN.

## 2018-02-01 NOTE — ED Notes (Signed)

## 2018-02-08 NOTE — H&P (Signed)
Joanne Lewis is a 73 y.o. female here for Discuss sono .f/up from my last visit 12/2017   For finding from ED with thickened endometrial stripe . EMBX : benign -fragmented polyp . Also with left ovarian cyst  Todays  U/S :Uterus retroverted  Complex thickened endometrium=13.62mm  Fibroids seen: 1) ? Fibroid vs endometrial mass (fundal portion of endometrium)=2.1cm 2)Rt lateral=2.5cm  No free fluid seen  Lt ovarian simple cyst=1.3cm  Rt ovary appears wnl    pt has been declared low risk for surgery by Dr Thedore Mins , PCP on 02/07/2018   Past Medical History:  has a past medical history of Allergic state (Codiene), Arthritis (2004), CKD (chronic kidney disease) stage 3, GFR 30-59 ml/min (CMS-HCC) (09/30/2017), Femoral hernia of right side, Glaucoma (increased eye pressure) (2017), Hyperlipidemia, Hypertension, and Ovarian cyst.  Past Surgical History:  has a past surgical history that includes Colonoscopy (08/09/2016) and femoral hernia surgery. Family History: family history includes Bipolar disorder in her mother; Myocardial Infarction (Heart attack) in her father, mother, paternal grandfather, and paternal grandmother; Obesity in her maternal grandmother; Osteoporosis (Thinning of bones) in her mother. Social History:  reports that she is a non-smoker but has been exposed to tobacco smoke. She has been exposed to tobacco smoke for the past 1.00 year. She has never used smokeless tobacco. She reports that she does not drink alcohol or use drugs. OB/GYN History:          OB History    Gravida  2   Para  2   Term      Preterm      AB      Living  2     SAB      TAB      Ectopic      Molar      Multiple      Live Births  2          Allergies: is allergic to codeine. Medications:  Current Outpatient Medications:  .  amLODIPine (NORVASC) 10 MG tablet, Take 0.5 tablets (5 mg total) by mouth once daily, Disp: 90 tablet, Rfl: 3 .  aspirin 81 MG EC  tablet, Take 81 mg by mouth once daily., Disp: , Rfl:  .  atorvastatin (LIPITOR) 20 MG tablet, Take 1 tablet (20 mg total) by mouth nightly, Disp: 90 tablet, Rfl: 3 .  carvedilol (COREG) 3.125 MG tablet, Take 0.5 tablets (1.5625 mg total) by mouth 2 (two) times daily, Disp: 90 tablet, Rfl: 3 .  cephalexin (KEFLEX) 500 MG capsule, Take 1 capsule (500 mg total) by mouth 3 (three) times daily for 7 days, Disp: 21 capsule, Rfl: 0 .  latanoprost (XALATAN) 0.005 % ophthalmic solution, Place 1 drop into both eyes nightly., Disp: , Rfl:  .  nitroGLYcerin (NITROSTAT) 0.4 MG SL tablet, Place under the tongue as needed., Disp: , Rfl:  .  spironolactone (ALDACTONE) 50 MG tablet, Take 1 tablet (50 mg total) by mouth once daily, Disp: 90 tablet, Rfl: 1 .  terbinafine HCl (LAMISIL) 250 mg tablet, Take 1 tablet (250 mg total) by mouth once daily, Disp: 90 tablet, Rfl: 0 .  timolol hemihydrate (BETIMOL) 0.5 % ophthalmic solution, Place 1 drop into both eyes every morning., Disp: , Rfl:   Review of Systems: General:                      No fatigue or weight loss Eyes:  No vision changes Ears:                            No hearing difficulty Respiratory:                No cough or shortness of breath Pulmonary:                  No asthma or shortness of breath Cardiovascular:           No chest pain, palpitations, dyspnea on exertion Gastrointestinal:          No abdominal bloating, chronic diarrhea, constipations, masses, pain or hematochezia Genitourinary:             No hematuria, dysuria, abnormal vaginal discharge, pelvic pain, Menometrorrhagia Lymphatic:                   No swollen lymph nodes Musculoskeletal:         No muscle weakness Neurologic:                  No extremity weakness, syncope, seizure disorder Psychiatric:                  No history of depression, delusions or suicidal/homicidal ideation    Exam:      Vitals:   01/24/18 0917  BP: 133/72  Pulse: 57     Body mass index is 30.38 kg/m.  WDWN white/  female in NAD   Lungs: CTA  CV : RRR without murmur    Neck:  no thyromegaly Abdomen: soft , no mass, normal active bowel sounds,  non-tender, no rebound tenderness Pelvic: tanner stage 5 ,  External genitalia: vulva /labia no lesions Urethra: no prolapse Vagina: normal physiologic d/c Cervix: no lesions, no cervical motion tenderness   Uterus: normal size shape and contour, non-tender Adnexa: no mass,  non-tender    Saline infusion sonohysterography: betadine prep to the cervix followed by placement of the HSG catheter into the endometrial canal . Sterile H2O is injected while performing a transvaginal u/s . Findings: 2.2 X x1.65x2.16 cm endometrial mass Impression:   The primary encounter diagnosis was Endometrial mass. Diagnoses of Left ovarian cyst, Thickened endometrium, and Endometrial stripe increased were also pertinent to this visit.    Plan:   Recommend Fractional D+C and myosure resection of endometrial mass Spoke to her about the findings and the reasoning for the procedure . She agrees. Preop clearance completed , low risk

## 2018-02-14 ENCOUNTER — Other Ambulatory Visit: Payer: Self-pay

## 2018-02-14 ENCOUNTER — Encounter
Admission: RE | Admit: 2018-02-14 | Discharge: 2018-02-14 | Disposition: A | Discharge status: Home / Self Care | Payer: Medicare Other | Source: Ambulatory Visit | Attending: Obstetrics and Gynecology | Admitting: Obstetrics and Gynecology

## 2018-02-14 DIAGNOSIS — Z01812 Encounter for preprocedural laboratory examination: Secondary | ICD-10-CM | POA: Insufficient documentation

## 2018-02-14 HISTORY — DX: Prediabetes: R73.03

## 2018-02-14 LAB — BASIC METABOLIC PANEL
Anion gap: 6 (ref 5–15)
BUN: 29 mg/dL — ABNORMAL HIGH (ref 8–23)
CO2: 26 mmol/L (ref 22–32)
Calcium: 9.4 mg/dL (ref 8.9–10.3)
Chloride: 105 mmol/L (ref 98–111)
Creatinine, Ser: 0.95 mg/dL (ref 0.44–1.00)
GFR calc Af Amer: >60 mL/min (ref >60)
GFR calc non Af Amer: >60 mL/min (ref >60)
Glucose, Bld: 108 mg/dL — ABNORMAL HIGH (ref 70–99)
Potassium: 4.6 mmol/L (ref 3.5–5.1)
Sodium: 137 mmol/L (ref 135–145)

## 2018-02-14 LAB — TYPE AND SCREEN
ABO/RH(D): B POS
Antibody Screen: NEGATIVE

## 2018-02-14 LAB — CBC
HCT: 45.1 % (ref 36.0–46.0)
Hemoglobin: 14.6 g/dL (ref 12.0–15.0)
MCH: 29.9 pg (ref 26.0–34.0)
MCHC: 32.4 g/dL (ref 30.0–36.0)
MCV: 92.2 fL (ref 80.0–100.0)
Platelets: 231 10*3/uL (ref 150–400)
RBC: 4.89 MIL/uL (ref 3.87–5.11)
RDW: 12.1 % (ref 11.5–15.5)
WBC: 6.6 10*3/uL (ref 4.0–10.5)
nRBC: 0 % (ref 0.0–0.2)

## 2018-02-14 LAB — SURGICAL PCR SCREEN
MRSA, PCR: NEGATIVE
Staphylococcus aureus: NEGATIVE

## 2018-02-14 NOTE — Patient Instructions (Addendum)
Your procedure is scheduled on: Monday 02/20/18.  Report to DAY SURGERY DEPARTMENT LOCATED ON 2ND FLOOR MEDICAL MALL ENTRANCE. To find out your arrival time please call [phone] between 1PM - 3PM on Friday 02/17/18.   Remember: Instructions that are not followed completely may result in serious medical risk, up to and including death, or upon the discretion of your surgeon and anesthesiologist your surgery may need to be rescheduled.      _X__ 1. Do not eat food after midnight the night before your procedure.                 No gum chewing or hard candies. You may drink clear liquids up until 2 hours prior to your arrival time. You have been given an Ensure Pre-Surgery Clear Carbohydrate Drink for the morning of your surgery. Please finish this drink 2 hours before your scheduled arrival time. It may taste best refrigerated.   __X__2.  On the morning of surgery brush your teeth with toothpaste and water, you may rinse your mouth with mouthwash if you wish.  Do not swallow any toothpaste or mouthwash.     __X__3.  Notify your doctor if there is any change in your medical condition      (cold, fever, infections).       Do not wear jewelry, make-up, hairpins, clips or nail polish. Do not wear lotions, powders, or perfumes.  Do not shave 48 hours prior to surgery. Men may shave face and neck. Do not bring valuables to the hospital.      [facility] is not responsible for any belongings or valuables.     Patients discharged the day of surgery will not be allowed to drive home.    Please read over the following fact sheets that you were given:   MRSA Information   __X__ Take these medicines the morning of surgery with A SIP OF WATER:     1. amLODipine (NORVASC) 10 MG tablet  2. carvedilol (COREG) 3.125 MG tablet  3. terbinafine (LAMISIL) 250 MG tablet  4. timolol (BETIMOL) 0.5 % ophthalmic solution     __X__ Stop Blood Thinners: Aspirin. Your last dose was today until  after your procedure.   __X__ Stop Anti-inflammatories 7 days before surgery such as Advil, Ibuprofen, Motrin, BC or Goodies Powder, Naprosyn, Naproxen, Aleve, Aspirin, Meloxicam. May take Tylenol if needed for pain or discomfort.    __X__ Do not begin taking any new herbal supplements until after your surgery.

## 2018-02-14 NOTE — Pre-Procedure Instructions (Signed)
Patient mentioned a sore on her right posterior shoulder that was concerning her during her PAT visit. She is afebrile and the wound does not have erythematous borders or oozing pus. It does not appear inflamed. I contacted the  Infection Control nurse for direction. She suggested covering the wound on the day of procedure or MRSA pcr. Dr. Feliberto Gottron notified of the suggestions and ordered both. MRSA pcr collected and order entered to cover wound in pre-op.

## 2018-02-15 ENCOUNTER — Other Ambulatory Visit: Payer: Self-pay | Admitting: Internal Medicine

## 2018-02-15 DIAGNOSIS — Z1231 Encounter for screening mammogram for malignant neoplasm of breast: Secondary | ICD-10-CM

## 2018-02-20 ENCOUNTER — Ambulatory Visit: Payer: Medicare Other | Admitting: Anesthesiology

## 2018-02-20 ENCOUNTER — Other Ambulatory Visit: Payer: Self-pay

## 2018-02-20 ENCOUNTER — Encounter
Admission: RE | Disposition: A | Discharge status: Home / Self Care | Payer: Self-pay | Source: Home / Self Care | Attending: Obstetrics and Gynecology

## 2018-02-20 ENCOUNTER — Ambulatory Visit
Admission: RE | Admit: 2018-02-20 | Discharge: 2018-02-20 | Disposition: A | Discharge status: Home / Self Care | Payer: Medicare Other | Attending: Obstetrics and Gynecology | Admitting: Obstetrics and Gynecology

## 2018-02-20 ENCOUNTER — Encounter: Payer: Self-pay | Admitting: Anesthesiology

## 2018-02-20 DIAGNOSIS — I129 Hypertensive chronic kidney disease with stage 1 through stage 4 chronic kidney disease, or unspecified chronic kidney disease: Secondary | ICD-10-CM | POA: Diagnosis not present

## 2018-02-20 DIAGNOSIS — Z79899 Other long term (current) drug therapy: Secondary | ICD-10-CM | POA: Diagnosis not present

## 2018-02-20 DIAGNOSIS — N83202 Unspecified ovarian cyst, left side: Secondary | ICD-10-CM | POA: Insufficient documentation

## 2018-02-20 DIAGNOSIS — I2510 Atherosclerotic heart disease of native coronary artery without angina pectoris: Secondary | ICD-10-CM | POA: Insufficient documentation

## 2018-02-20 DIAGNOSIS — N840 Polyp of corpus uteri: Secondary | ICD-10-CM | POA: Diagnosis not present

## 2018-02-20 DIAGNOSIS — R9389 Abnormal findings on diagnostic imaging of other specified body structures: Secondary | ICD-10-CM | POA: Diagnosis present

## 2018-02-20 DIAGNOSIS — Z7982 Long term (current) use of aspirin: Secondary | ICD-10-CM | POA: Insufficient documentation

## 2018-02-20 DIAGNOSIS — E785 Hyperlipidemia, unspecified: Secondary | ICD-10-CM | POA: Insufficient documentation

## 2018-02-20 DIAGNOSIS — Z7722 Contact with and (suspected) exposure to environmental tobacco smoke (acute) (chronic): Secondary | ICD-10-CM | POA: Insufficient documentation

## 2018-02-20 DIAGNOSIS — N183 Chronic kidney disease, stage 3 (moderate): Secondary | ICD-10-CM | POA: Insufficient documentation

## 2018-02-20 DIAGNOSIS — H409 Unspecified glaucoma: Secondary | ICD-10-CM | POA: Insufficient documentation

## 2018-02-20 HISTORY — DX: Chronic kidney disease, unspecified: N18.9

## 2018-02-20 HISTORY — PX: HYSTEROSCOPY W/D&C: SHX1775

## 2018-02-20 HISTORY — PX: DILATION AND CURETTAGE OF UTERUS: SHX78

## 2018-02-20 HISTORY — PX: DILATATION & CURETTAGE/HYSTEROSCOPY WITH MYOSURE: SHX6511

## 2018-02-20 HISTORY — DX: Pneumonia, unspecified organism: J18.9

## 2018-02-20 LAB — GLUCOSE, CAPILLARY: Glucose-Capillary: 99 mg/dL (ref 70–99)

## 2018-02-20 LAB — ABO/RH: ABO/RH(D): B POS

## 2018-02-20 SURGERY — DILATION AND CURETTAGE
Anesthesia: General

## 2018-02-20 MED ORDER — ONDANSETRON HCL 4 MG/2ML IJ SOLN
INTRAMUSCULAR | Status: AC
  Filled 2018-02-20: qty 2

## 2018-02-20 MED ORDER — FENTANYL CITRATE (PF) 100 MCG/2ML IJ SOLN
INTRAMUSCULAR | Status: AC
  Administered 2018-02-20: 25 ug via INTRAVENOUS
  Filled 2018-02-20: qty 2

## 2018-02-20 MED ORDER — ACETAMINOPHEN 500 MG PO TABS
ORAL_TABLET | ORAL | Status: AC
  Administered 2018-02-20: 1000 mg via ORAL
  Filled 2018-02-20: qty 2

## 2018-02-20 MED ORDER — PROPOFOL 10 MG/ML IV BOLUS
INTRAVENOUS | Status: AC
  Filled 2018-02-20: qty 20

## 2018-02-20 MED ORDER — KETOROLAC TROMETHAMINE 30 MG/ML IJ SOLN: INTRAMUSCULAR | Status: DC | PRN

## 2018-02-20 MED ORDER — PROPOFOL 10 MG/ML IV BOLUS
INTRAVENOUS | Status: DC | PRN
  Administered 2018-02-20: 130 mg via INTRAVENOUS

## 2018-02-20 MED ORDER — KETOROLAC TROMETHAMINE 60 MG/2ML IM SOLN
INTRAMUSCULAR | Status: DC | PRN
  Administered 2018-02-20: 15 mg via INTRAMUSCULAR

## 2018-02-20 MED ORDER — LIDOCAINE HCL (PF) 2 % IJ SOLN
INTRAMUSCULAR | Status: AC
  Filled 2018-02-20: qty 10

## 2018-02-20 MED ORDER — FENTANYL CITRATE (PF) 100 MCG/2ML IJ SOLN
INTRAMUSCULAR | Status: DC | PRN
  Administered 2018-02-20 (×2): 25 ug via INTRAVENOUS

## 2018-02-20 MED ORDER — LACTATED RINGERS IV SOLN
INTRAVENOUS | Status: DC
  Administered 2018-02-20: 12:00:00 via INTRAVENOUS

## 2018-02-20 MED ORDER — EPHEDRINE SULFATE 50 MG/ML IJ SOLN
INTRAMUSCULAR | Status: DC | PRN
  Administered 2018-02-20: 10 mg via INTRAVENOUS

## 2018-02-20 MED ORDER — ONDANSETRON HCL 4 MG/2ML IJ SOLN: 4.0000 mg | Freq: Once | INTRAMUSCULAR | Status: DC | PRN

## 2018-02-20 MED ORDER — DEXAMETHASONE SODIUM PHOSPHATE 10 MG/ML IJ SOLN
INTRAMUSCULAR | Status: AC
  Filled 2018-02-20: qty 1

## 2018-02-20 MED ORDER — LIDOCAINE HCL (CARDIAC) PF 100 MG/5ML IV SOSY
PREFILLED_SYRINGE | INTRAVENOUS | Status: DC | PRN
  Administered 2018-02-20: 60 mg via INTRAVENOUS

## 2018-02-20 MED ORDER — DEXAMETHASONE SODIUM PHOSPHATE 10 MG/ML IJ SOLN
INTRAMUSCULAR | Status: DC | PRN
  Administered 2018-02-20: 5 mg via INTRAVENOUS

## 2018-02-20 MED ORDER — GABAPENTIN 300 MG PO CAPS
ORAL_CAPSULE | ORAL | Status: AC
  Administered 2018-02-20: 300 mg via ORAL
  Filled 2018-02-20: qty 1

## 2018-02-20 MED ORDER — GLYCOPYRROLATE 0.2 MG/ML IJ SOLN
INTRAMUSCULAR | Status: DC | PRN
  Administered 2018-02-20: 0.2 mg via INTRAVENOUS

## 2018-02-20 MED ORDER — GABAPENTIN 300 MG PO CAPS
300.0000 mg | ORAL_CAPSULE | ORAL | Status: AC
  Administered 2018-02-20: 300 mg via ORAL

## 2018-02-20 MED ORDER — FENTANYL CITRATE (PF) 100 MCG/2ML IJ SOLN
INTRAMUSCULAR | Status: AC
  Filled 2018-02-20: qty 2

## 2018-02-20 MED ORDER — CEFAZOLIN SODIUM-DEXTROSE 2-4 GM/100ML-% IV SOLN
2.0000 g | Freq: Once | INTRAVENOUS | Status: AC
  Administered 2018-02-20: 2 g via INTRAVENOUS

## 2018-02-20 MED ORDER — ACETAMINOPHEN 500 MG PO TABS
1000.0000 mg | ORAL_TABLET | ORAL | Status: AC
  Administered 2018-02-20: 1000 mg via ORAL

## 2018-02-20 MED ORDER — ONDANSETRON HCL 4 MG/2ML IJ SOLN
INTRAMUSCULAR | Status: DC | PRN
  Administered 2018-02-20: 4 mg via INTRAVENOUS

## 2018-02-20 MED ORDER — FENTANYL CITRATE (PF) 100 MCG/2ML IJ SOLN
25.0000 ug | INTRAMUSCULAR | Status: DC | PRN
  Administered 2018-02-20: 25 ug via INTRAVENOUS

## 2018-02-20 MED ORDER — CEFAZOLIN SODIUM-DEXTROSE 2-4 GM/100ML-% IV SOLN
INTRAVENOUS | Status: AC
  Filled 2018-02-20: qty 100

## 2018-02-20 MED ORDER — EPHEDRINE SULFATE 50 MG/ML IJ SOLN
INTRAMUSCULAR | Status: AC
  Filled 2018-02-20: qty 1

## 2018-02-20 SURGICAL SUPPLY — 21 items
CANISTER SUC SOCK COL 7IN (MISCELLANEOUS) ×2 IMPLANT
CANISTER SUCT 3000ML PPV (MISCELLANEOUS) ×2 IMPLANT
CATH ROBINSON RED A/P 16FR (CATHETERS) ×2 IMPLANT
COVER WAND RF STERILE (DRAPES) ×2 IMPLANT
DEVICE MYOSURE LITE (MISCELLANEOUS) IMPLANT
DEVICE MYOSURE REACH (MISCELLANEOUS) IMPLANT
GLOVE BIO SURGEON STRL SZ8 (GLOVE) ×2 IMPLANT
GOWN STRL REUS W/ TWL LRG LVL3 (GOWN DISPOSABLE) ×1 IMPLANT
GOWN STRL REUS W/ TWL XL LVL3 (GOWN DISPOSABLE) ×1 IMPLANT
GOWN STRL REUS W/TWL LRG LVL3 (GOWN DISPOSABLE) ×1
GOWN STRL REUS W/TWL XL LVL3 (GOWN DISPOSABLE) ×1
IV LACTATED RINGERS 1000ML (IV SOLUTION) ×2 IMPLANT
KIT PROCEDURE FLUENT (KITS) ×2 IMPLANT
KIT TURNOVER CYSTO (KITS) ×2 IMPLANT
PACK DNC HYST (MISCELLANEOUS) ×2 IMPLANT
PAD OB MATERNITY 4.3X12.25 (PERSONAL CARE ITEMS) ×2 IMPLANT
PAD PREP 24X41 OB/GYN DISP (PERSONAL CARE ITEMS) ×2 IMPLANT
SOL .9 NS 3000ML IRR  AL (IV SOLUTION) ×1
SOL .9 NS 3000ML IRR UROMATIC (IV SOLUTION) ×1 IMPLANT
TOWEL OR 17X26 4PK STRL BLUE (TOWEL DISPOSABLE) ×2 IMPLANT
TUBING CONNECTING 10 (TUBING) ×2 IMPLANT

## 2018-02-20 NOTE — Anesthesia Preprocedure Evaluation (Signed)
Anesthesia Evaluation  Patient identified by MRN, date of birth, ID band Patient awake    Reviewed: Allergy & Precautions, NPO status , Patient's Chart, lab work & pertinent test results, reviewed documented beta blocker date and time   Airway Mallampati: III  TM Distance: >3 FB     Dental  (+) Chipped   Pulmonary           Cardiovascular hypertension, Pt. on medications and Pt. 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  Reproductive/Obstetrics                             Anesthesia Physical Anesthesia Plan  ASA: III  Anesthesia Plan: General   Post-op Pain Management:    Induction: Intravenous  PONV Risk Score and Plan:   Airway Management Planned: Oral ETT and LMA  Additional Equipment:   Intra-op Plan:   Post-operative Plan:   Informed Consent: I have reviewed the patients History and Physical, chart, labs and discussed the procedure including the risks, benefits and alternatives for the proposed anesthesia with the patient or authorized representative who has indicated his/her understanding and acceptance.       Plan Discussed with: CRNA  Anesthesia Plan Comments:         Anesthesia Quick Evaluation

## 2018-02-20 NOTE — Discharge Instructions (Signed)
Dilation and Curettage or Vacuum Curettage, Care After °These instructions give you information about caring for yourself after your procedure. Your doctor may also give you more specific instructions. Call your doctor if you have any problems or questions after your procedure. °Follow these instructions at home: °Activity °· Do not drive or use heavy machinery while taking prescription pain medicine. °· For 24 hours after your procedure, avoid driving. °· Take short walks often, followed by rest periods. Ask your doctor what activities are safe for you. After one or two days, you may be able to return to your normal activities. °· Do not lift anything that is heavier than 10 lb (4.5 kg) until your doctor approves. °· For at least 2 weeks, or as long as told by your doctor: °? Do not douche. °? Do not use tampons. °? Do not have sex. °General instructions ° °· Take over-the-counter and prescription medicines only as told by your doctor. This is very important if you take blood thinning medicine. °· Do not take baths, swim, or use a hot tub until your doctor approves. Take showers instead of baths. °· Wear compression stockings as told by your doctor. °· It is up to you to get the results of your procedure. Ask your doctor when your results will be ready. °· Keep all follow-up visits as told by your doctor. This is important. °Contact a doctor if: °· You have very bad cramps that get worse or do not get better with medicine. °· You have very bad pain in your belly (abdomen). °· You cannot drink fluids without throwing up (vomiting). °· You get pain in a different part of the area between your belly and thighs (pelvis). °· You have bad-smelling discharge from your vagina. °· You have a rash. °Get help right away if: °· You are bleeding a lot from your vagina. A lot of bleeding means soaking more than one sanitary pad in an hour, for 2 hours in a row. °· You have clumps of blood (blood clots) coming from your  vagina. °· You have a fever or chills. °· Your belly feels very tender or hard. °· You have chest pain. °· You have trouble breathing. °· You cough up blood. °· You feel dizzy. °· You feel light-headed. °· You pass out (faint). °· You have pain in your neck or shoulder area. °Summary °· Take short walks often, followed by rest periods. Ask your doctor what activities are safe for you. After one or two days, you may be able to return to your normal activities. °· Do not lift anything that is heavier than 10 lb (4.5 kg) until your doctor approves. °· Do not take baths, swim, or use a hot tub until your doctor approves. Take showers instead of baths. °· Contact your doctor if you have any symptoms of infection, like bad-smelling discharge from your vagina. °This information is not intended to replace advice given to you by your health care provider. Make sure you discuss any questions you have with your health care provider. °Document Released: 10/07/2007 Document Revised: 09/15/2015 Document Reviewed: 09/15/2015 °Elsevier Interactive Patient Education © 2019 [employer] ° °AMBULATORY SURGERY  °DISCHARGE INSTRUCTIONS ° ° °1) The drugs that you were given will stay in your system until tomorrow so for the next 24 hours you should not: ° °A) Drive an automobile °B) Make any legal decisions °C) Drink any alcoholic beverage ° ° °2) You may resume regular meals tomorrow.  Today it is better   to start with liquids and gradually work up to solid foods. ° °You may eat anything you prefer, but it is better to start with liquids, then soup and crackers, and gradually work up to solid foods. ° ° °3) Please notify your doctor immediately if you have any unusual bleeding, trouble breathing, redness and pain at the surgery site, drainage, fever, or pain not relieved by medication. ° ° ° °4) Additional Instructions: ° ° ° ° ° ° ° °Please contact your physician with any problems or Same Day Surgery at [phone], Monday through  Friday 6 am to 4 pm, or [facility] Main number at [phone]. ° °

## 2018-02-20 NOTE — Anesthesia Procedure Notes (Signed)
Procedure Name: LMA Insertion Date/Time: 02/20/2018 12:51 PM Performed by: Dava Najjar, CRNA Pre-anesthesia Checklist: Patient identified, Emergency Drugs available, Suction available and Patient being monitored Patient Re-evaluated:Patient Re-evaluated prior to induction Oxygen Delivery Method: Circle system utilized Preoxygenation: Pre-oxygenation with 100% oxygen Induction Type: IV induction Ventilation: Mask ventilation without difficulty LMA: LMA inserted LMA Size: 4.0 Number of attempts: 1 Placement Confirmation: positive ETCO2 and breath sounds checked- equal and bilateral Tube secured with: Tape Dental Injury: Teeth and Oropharynx as per pre-operative assessment

## 2018-02-20 NOTE — Brief Op Note (Signed)
02/20/2018  1:39 PM  PATIENT:  Joanne Lewis  73 y.o. female  PRE-OPERATIVE DIAGNOSIS:  endometrial mass, thickened endometrial stripe   POST-OPERATIVE DIAGNOSIS:  Same as above PROCEDURE:  Fractional Dilation and curetage myosure resection of endometrial polyp SURGEON:  Surgeon(s) and Role:    * Schermerhorn, Ihor Austin, MD - Primary  PHYSICIAN ASSISTANT: Pa student Joline Maxcy essendelft  ASSISTANTS: none   ANESTHESIA:   general  EBL: minimal  BLOOD ADMINISTERED:none  DRAINS: none   LOCAL MEDICATIONS USED:  NONE  SPECIMEN:  Source of Specimen:  ecc+ endometrial curettings with endometrial polyp    DISPOSITION OF SPECIMEN:  PATHOLOGY  COUNTS:  YES  TOURNIQUET:  * No tourniquets in log *  DICTATION: .Other Dictation: Dictation Number verbal  PLAN OF CARE: Discharge to home after PACU  PATIENT DISPOSITION:  PACU - hemodynamically stable.   Delay start of Pharmacological VTE agent (>24hrs) due to surgical blood loss or risk of bleeding: not applicable

## 2018-02-20 NOTE — Anesthesia Post-op Follow-up Note (Signed)
Anesthesia QCDR form completed.        

## 2018-02-20 NOTE — Transfer of Care (Signed)
Immediate Anesthesia Transfer of Care Note  Patient: Joanne Lewis  Procedure(s) Performed: DILATATION AND CURETTAGE, fractional, resection of endometrial mass, possible myosure, hysteroscopy (N/A ) DILATATION AND CURETTAGE /HYSTEROSCOPY, (N/A ) DILATATION & CURETTAGE/HYSTEROSCOPY WITH MYOSURE (N/A )  Patient Location: PACU  Anesthesia Type:General  Level of Consciousness: awake, alert , oriented and patient cooperative  Airway & Oxygen Therapy: Patient Spontanous Breathing and Patient connected to face mask oxygen  Post-op Assessment: Report given to RN and Post -op Vital signs reviewed and stable  Post vital signs: Reviewed and stable  Last Vitals:  Vitals Value Taken Time  BP 115/62 02/20/2018  1:52 PM  Temp    Pulse 65 02/20/2018  1:53 PM  Resp 15 02/20/2018  1:53 PM  SpO2 100 % 02/20/2018  1:53 PM  Vitals shown include unvalidated device data.  Last Pain:  Vitals:   02/20/18 1140  TempSrc: Oral  PainSc: 0-No pain         Complications: No apparent anesthesia complications

## 2018-02-20 NOTE — Anesthesia Postprocedure Evaluation (Signed)
Anesthesia Post Note  Patient: Joanne Lewis  Procedure(s) Performed: DILATATION AND CURETTAGE, fractional, resection of endometrial mass, possible myosure, hysteroscopy (N/A ) DILATATION AND CURETTAGE /HYSTEROSCOPY, (N/A ) DILATATION & CURETTAGE/HYSTEROSCOPY WITH MYOSURE (N/A )  Patient location during evaluation: PACU Anesthesia Type: General Level of consciousness: awake and alert Pain management: pain level controlled Vital Signs Assessment: post-procedure vital signs reviewed and stable Respiratory status: spontaneous breathing, nonlabored ventilation, respiratory function stable and patient connected to nasal cannula oxygen Cardiovascular status: blood pressure returned to baseline and stable Postop Assessment: no apparent nausea or vomiting Anesthetic complications: no     Last Vitals:  Vitals:   02/20/18 1431 02/20/18 1518  BP: 130/62 (!) 161/74  Pulse: (!) 56 (!) 56  Resp: 14 14  Temp: (!) 36 C   SpO2: 100% 100%    Last Pain:  Vitals:   02/20/18 1431  TempSrc: Temporal  PainSc: 0-No pain                 Nasiah Lehenbauer S

## 2018-02-21 NOTE — Op Note (Signed)
NAME: Joanne Lewis, Joanne Lewis MEDICAL RECORD [id number] ACCOUNT [id number] DATE OF BIRTH:May 28, 1945 FACILITY: [facility] LOCATION: [facility] PHYSICIAN:Heela Heishman Cloyde Reams, MD  OPERATIVE REPORT  DATE OF PROCEDURE:  02/20/2018  PREOPERATIVE DIAGNOSES:   1.  Thickened endometrial stripe. 2.  Endometrial polyp.  POSTOPERATIVE DIAGNOSES:   1.  Thickened endometrial stripe. 2.  Endometrial polyp.  PROCEDURE: 1.  Fractional dilation and curettage. 2.  Hysteroscopy and resection of endometrial polyp with MyoSure.  SURGEON:  Suzy Bouchard, MD  FIRST ASSISTANT:  Durene Cal, PA student.  ANESTHESIA:  General endotracheal anesthesia.  INDICATIONS:  A 73 year old female who underwent a CT scan for separate issue was noted to have a thickened endometrial stripe.  Ultrasound evaluation demonstrated a 2.2 x 1.6 cm endometrial mass that was identified on a saline infusion  sonohysterography.  The patient denies postmenopausal bleeding.  An office endometrial biopsy was benign.  DESCRIPTION OF PROCEDURE:  After adequate general endotracheal anesthesia, the patient was placed in dorsal supine position with legs in the Stroud stirrups.  The patient's lower abdomen, perineum and vagina were prepped and draped in normal sterile  fashion.  Timeout was performed.  The patient did receive 2 grams IV Ancef prior to commencement of the case.  Straight catheterization of the bladder yielded 100 mL of clear urine.  A weighted speculum was placed in the posterior vaginal vault and the  anterior cervix was grasped with a single tooth tenaculum.  An endocervical curettage was performed with scant tissue.  Cervix was then dilated to a #16 Hanks dilator without difficulty.  The patient's uterus was in the retroverted position.  The  hysteroscope with normal saline as the distending medium was then advanced into the endometrial cavity.  The Fluent apparatus that measured instillation and deficit was  utilized during the procedure.  Initial evaluation with the hysteroscope showed a  broad-based 2 x 1 cm posterior endometrial polyp.  The MyoSure was brought up to the operative field, and the resection of this polyp was performed without difficulty.  There was a small submucosal fibroid approximately 7 x 7 mm, which the MyoSure could  not resect.  Pictures were taken and the MyoSure was removed with the hysteroscope.  An endometrial curettage was then performed with a scant amount of additional tissue.  Hemostasis was good.  There were no complications.  The patient did receive 50 mg  of intramuscular Toradol the end of the case.  Net deficit on the hysteroscope 65 mL.  BLEEDING:  Minimal.  URINE OUTPUT:  100 mL  The patient tolerated the procedure well and was taken to recovery room in good condition.  TN/NUANCE  D:02/20/2018 T:02/21/2018 JOB:[id number]

## 2018-02-22 LAB — SURGICAL PATHOLOGY

## 2018-03-19 IMAGING — MG MM DIGITAL SCREENING BILAT W/ TOMO W/ CAD
8 of 13 series · 8 of 29 positions shown · non-contrast
Comparison: Previous exam(s).

CLINICAL DATA: Screening.

EXAM:
DIGITAL SCREENING BILATERAL MAMMOGRAM WITH TOMO AND CAD

[L MLO (1 of 2)]
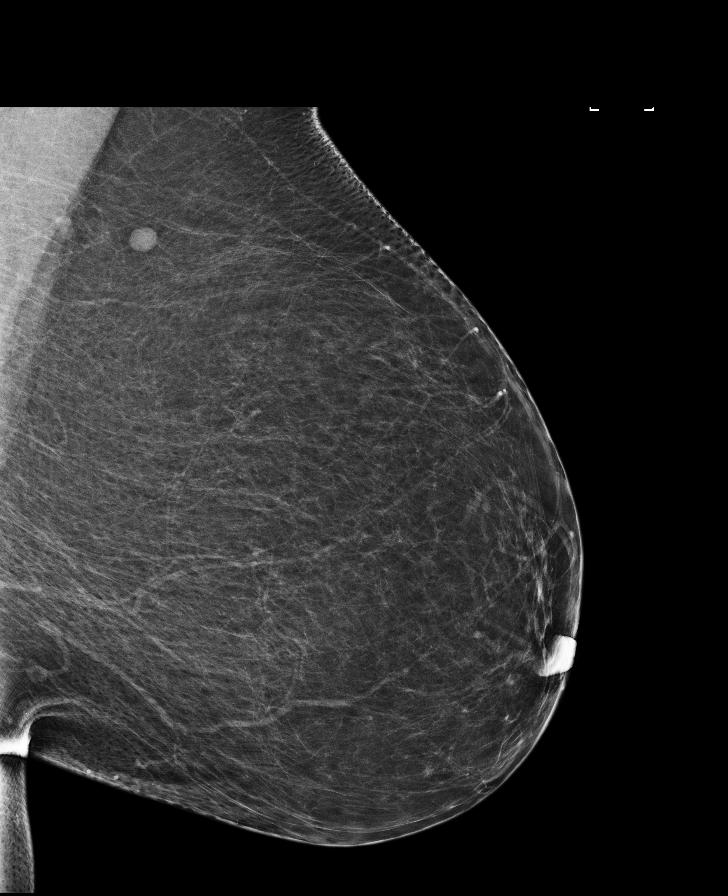

[R CC]
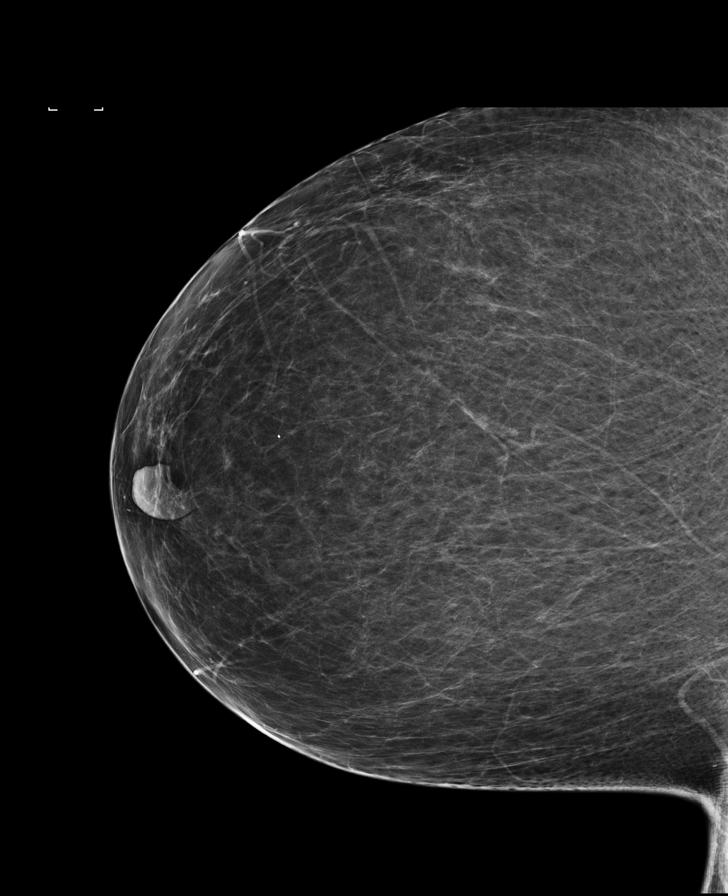

[L CC synth-2D]
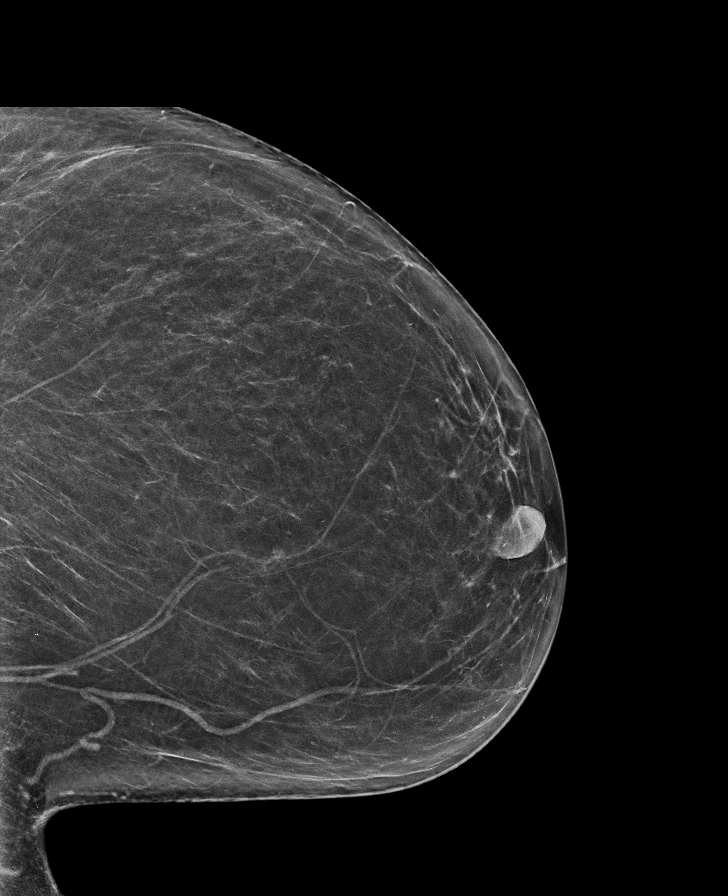

[L MLO synth-2D]
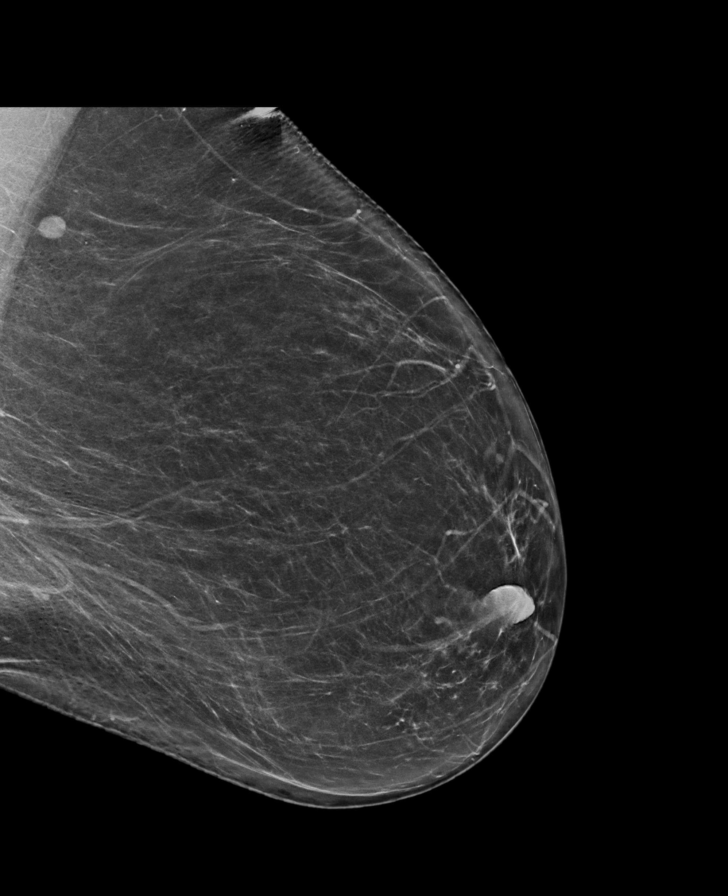

[L MLO (2 of 2)]
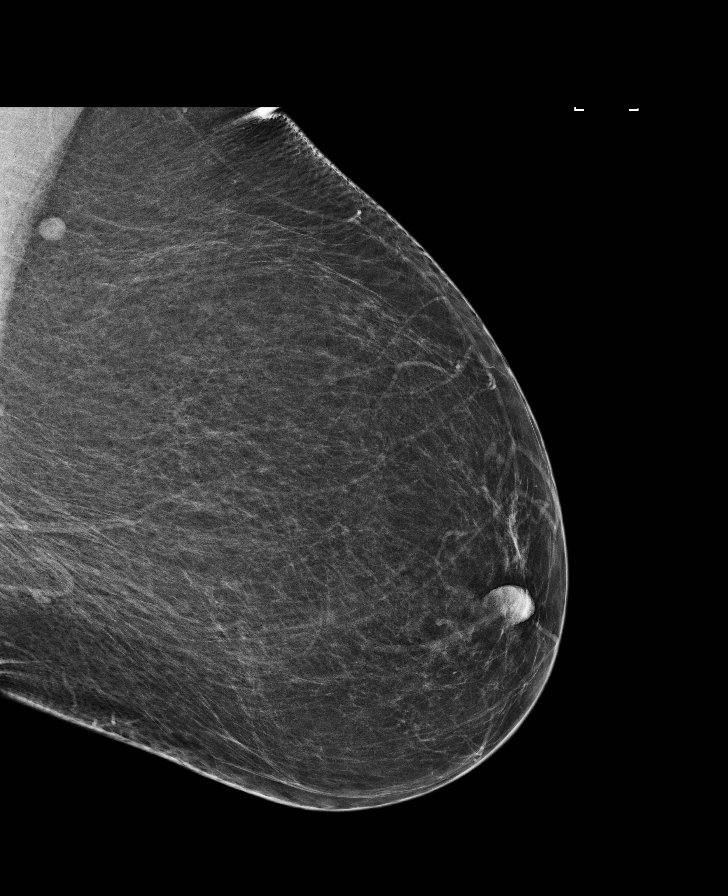

[R CC synth-2D]
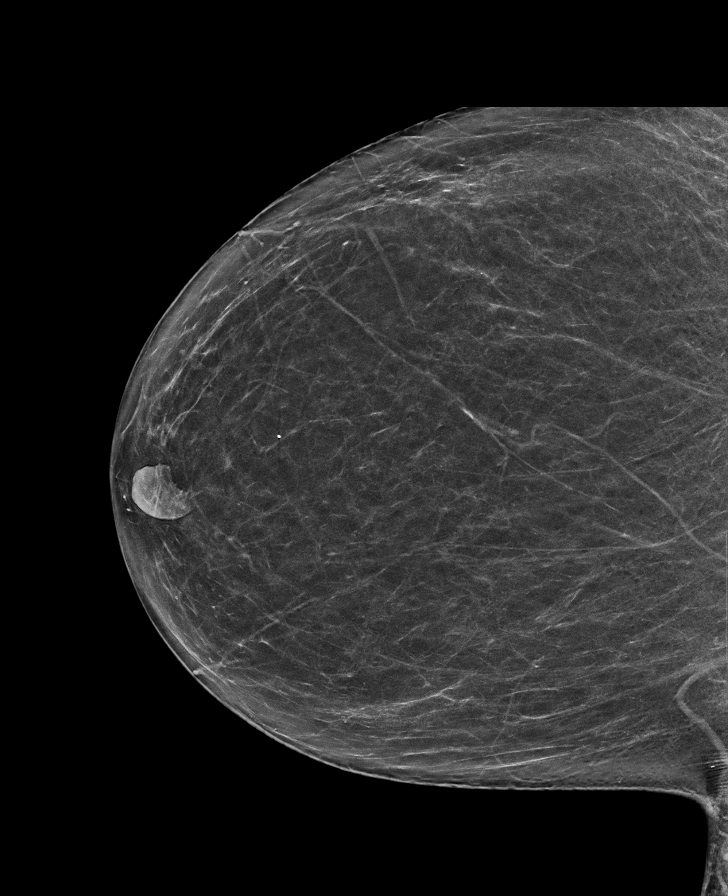

[L CC]
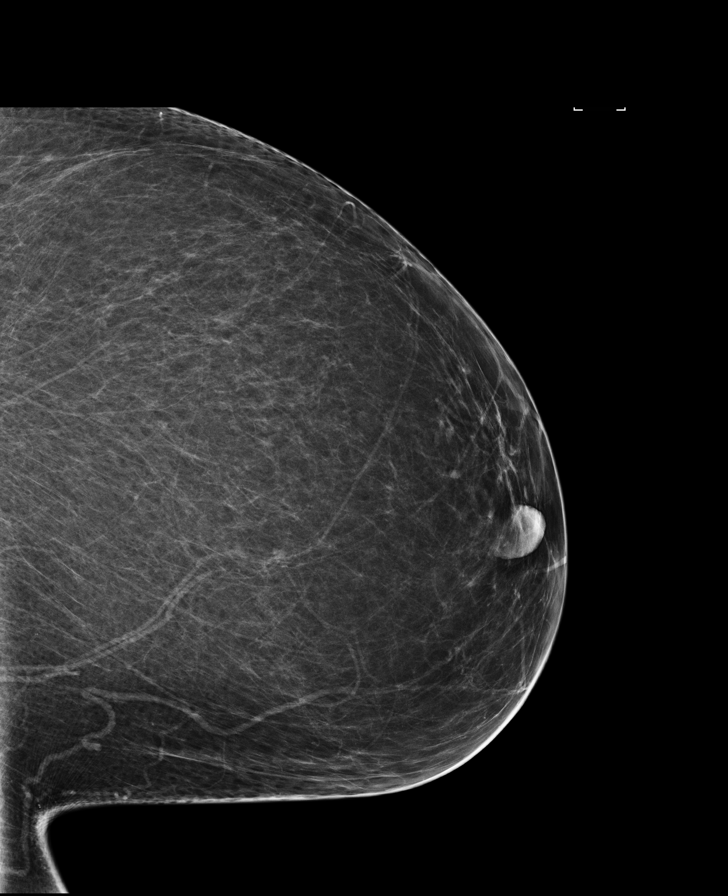

[R MLO synth-2D]
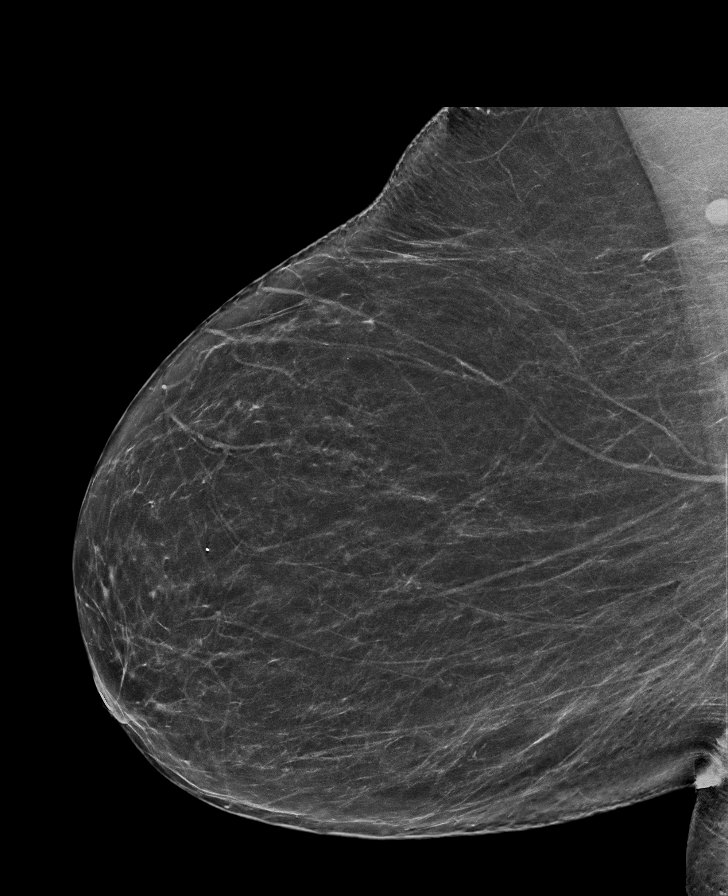

[8 of 29 positions shown; findings below may reference images not displayed]

ACR Breast Density Category b: There are scattered areas of
fibroglandular density.
FINDINGS: There are no findings suspicious for malignancy. Images were
processed with CAD.
IMPRESSION: No mammographic evidence of malignancy. A result letter of this
screening mammogram will be mailed directly to the patient.

RECOMMENDATION:
Screening mammogram in one year. (Code:[id number])

BI-RADS CATEGORY  1: Negative.

## 2018-07-11 ENCOUNTER — Emergency Department: Payer: Medicare Other

## 2018-07-11 ENCOUNTER — Emergency Department
Admission: EM | Admit: 2018-07-11 | Discharge: 2018-07-11 | Disposition: A | Discharge status: Home / Self Care | Payer: Medicare Other | Attending: Student in an Organized Health Care Education/Training Program | Admitting: Student in an Organized Health Care Education/Training Program

## 2018-07-11 ENCOUNTER — Encounter: Payer: Self-pay | Admitting: Emergency Medicine

## 2018-07-11 ENCOUNTER — Other Ambulatory Visit: Payer: Self-pay

## 2018-07-11 DIAGNOSIS — L989 Disorder of the skin and subcutaneous tissue, unspecified: Secondary | ICD-10-CM

## 2018-07-11 DIAGNOSIS — I129 Hypertensive chronic kidney disease with stage 1 through stage 4 chronic kidney disease, or unspecified chronic kidney disease: Secondary | ICD-10-CM | POA: Diagnosis not present

## 2018-07-11 DIAGNOSIS — Z7982 Long term (current) use of aspirin: Secondary | ICD-10-CM | POA: Diagnosis not present

## 2018-07-11 DIAGNOSIS — Z79899 Other long term (current) drug therapy: Secondary | ICD-10-CM | POA: Insufficient documentation

## 2018-07-11 DIAGNOSIS — R531 Weakness: Secondary | ICD-10-CM | POA: Insufficient documentation

## 2018-07-11 DIAGNOSIS — R55 Syncope and collapse: Secondary | ICD-10-CM | POA: Diagnosis not present

## 2018-07-11 DIAGNOSIS — N189 Chronic kidney disease, unspecified: Secondary | ICD-10-CM | POA: Insufficient documentation

## 2018-07-11 DIAGNOSIS — F424 Excoriation (skin-picking) disorder: Secondary | ICD-10-CM | POA: Insufficient documentation

## 2018-07-11 LAB — URINALYSIS, COMPLETE (UACMP) WITH MICROSCOPIC
Bacteria, UA: NONE SEEN
Bilirubin Urine: NEGATIVE
Glucose, UA: NEGATIVE mg/dL
Hgb urine dipstick: NEGATIVE
Ketones, ur: NEGATIVE mg/dL
Leukocytes,Ua: NEGATIVE
Nitrite: NEGATIVE
Protein, ur: NEGATIVE mg/dL
Specific Gravity, Urine: 1.015 (ref 1.005–1.030)
pH: 6 (ref 5.0–8.0)

## 2018-07-11 LAB — BASIC METABOLIC PANEL
Anion gap: 10 (ref 5–15)
BUN: 30 mg/dL — ABNORMAL HIGH (ref 8–23)
CO2: 24 mmol/L (ref 22–32)
Calcium: 9.1 mg/dL (ref 8.9–10.3)
Chloride: 102 mmol/L (ref 98–111)
Creatinine, Ser: 0.93 mg/dL (ref 0.44–1.00)
GFR calc Af Amer: >60 mL/min (ref >60)
GFR calc non Af Amer: >60 mL/min (ref >60)
Glucose, Bld: 110 mg/dL — ABNORMAL HIGH (ref 70–99)
Potassium: 5.1 mmol/L (ref 3.5–5.1)
Sodium: 136 mmol/L (ref 135–145)

## 2018-07-11 LAB — CBC
HCT: 45.4 % (ref 36.0–46.0)
Hemoglobin: 14.8 g/dL (ref 12.0–15.0)
MCH: 29.6 pg (ref 26.0–34.0)
MCHC: 32.6 g/dL (ref 30.0–36.0)
MCV: 90.8 fL (ref 80.0–100.0)
Platelets: 242 10*3/uL (ref 150–400)
RBC: 5 MIL/uL (ref 3.87–5.11)
RDW: 12 % (ref 11.5–15.5)
WBC: 6 10*3/uL (ref 4.0–10.5)
nRBC: 0 % (ref 0.0–0.2)

## 2018-07-11 LAB — TROPONIN I (HIGH SENSITIVITY): Troponin I (High Sensitivity): 3 ng/L (ref <18)

## 2018-07-11 MED ORDER — SODIUM CHLORIDE 0.9% FLUSH
3.0000 mL | Freq: Once | INTRAVENOUS | Status: AC
  Administered 2018-07-11: 3 mL via INTRAVENOUS

## 2018-07-11 MED ORDER — MUPIROCIN 2 % EX OINT: TOPICAL_OINTMENT | CUTANEOUS | 0 refills | Status: AC

## 2018-07-11 NOTE — Discharge Instructions (Signed)
Please follow up with your primary care provider. ° °Return to the ER for symptoms that change or worsen if unable to schedule an appointment. °

## 2018-07-11 NOTE — ED Notes (Signed)
Patient out of bed to commode unassisted, steady gait noted.

## 2018-07-11 NOTE — ED Provider Notes (Signed)
[facility] Emergency Department Provider Note   ____________________________________________   First MD Initiated Contact with Patient 07/11/18 1559     (approximate)  I have reviewed the triage vital signs and the nursing notes.   HISTORY  Chief Complaint Weakness    HPI Joanne Lewis is a 73 y.o. female who presents to the emergency department for treatment and evaluation of weakness. Symptoms started 3 days ago. She was outside working in her yard and felt some numbness in her right leg that lasted a few seconds. She got scared and called EMS who came and did an ECG. She felt better and decided not to come to the ER. Since then, she has been worried about herself. She has lesions on her back that she has "picked" which is common when she gets worried. She denies any weakness or numbness at this time. She did not have any chest pain or shortness of breath associated with the transient numbness in her foot. She "Googled" her symptoms and decided to come in for evaluation. [facility] sent her here after hearing she had numbness in her foot and leg on Saturday.     Past Medical History:  Diagnosis Date  . 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HCC)   . Anginal pain (HCC)   . Arthritis   . Chronic kidney disease    moderate  . Fatigue   . 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 Hyperkalemia   . Hyperlipidemia   . Hypertension   . Pneumonia    at least 10 years ago  . Pre-diabetes     Patient Active Problem List   Diagnosis Date Noted  . SOB (shortness of breath) 10/10/2017  . Coronary artery calcification 09/19/2016  . Chest pain 02/03/2016  . Elevated troponin 02/03/2016  . Anxiety 02/03/2016  . Essential hypertension 02/03/2016  . Mixed hyperlipidemia 02/03/2016  . Chest tightness 01/01/2016    Past Surgical History:  Procedure Laterality Date  . COLONOSCOPY WITH PROPOFOL N/A 08/09/2016   Procedure:  COLONOSCOPY WITH PROPOFOL;  Surgeon: Scot JunElliott, Robert T, MD;  [facility];  Service: Endoscopy;  Laterality: N/A;  . DILATATION & CURETTAGE/HYSTEROSCOPY WITH MYOSURE N/A 02/20/2018   Procedure: DILATATION & CURETTAGE/HYSTEROSCOPY WITH MYOSURE;  Surgeon: Schermerhorn, Ihor Austinhomas J, MD;  Location: [facility] ORS;  Service: Gynecology;  Laterality: N/A;  . DILATION AND CURETTAGE OF UTERUS N/A 02/20/2018   Procedure: DILATATION AND CURETTAGE, fractional, resection of endometrial mass, possible myosure, hysteroscopy;  Surgeon: Schermerhorn, Ihor Austinhomas J, MD;  Location: [facility] ORS;  Service: Gynecology;  Laterality: N/A;  . FEMORAL HERNIA REPAIR Right 11/26/2017   Procedure: HERNIA REPAIR FEMORAL with insertion or mesh;  Surgeon: Ancil Linseyavis, Jason Evan, MD;  Location: [facility] ORS;  Service: General;  Laterality: Right;  . HYSTEROSCOPY W/D&C N/A 02/20/2018   Procedure: DILATATION AND CURETTAGE /HYSTEROSCOPY,;  Surgeon: Schermerhorn, Ihor Austinhomas J, MD;  Location: [facility] ORS;  Service: Gynecology;  Laterality: N/A;    Prior to Admission medications   Medication Sig Start Date End Date Taking? Authorizing Provider  amLODipine (NORVASC) 5 MG tablet Take 5 mg by mouth daily.    Yes [provider]  aspirin EC 81 MG tablet Take 81 mg by mouth daily.    Yes [provider]  atorvastatin (LIPITOR) 20 MG tablet Take 20 mg by mouth every evening.    Yes [provider]  carvedilol (COREG) 3.125 MG tablet Take 1.5625 mg by mouth 2 (two) times daily with a  meal.    Yes [provider]  latanoprost (XALATAN) 0.005 % ophthalmic solution Place 1 drop into both eyes at bedtime.   Yes [provider]  nitroGLYCERIN (NITROSTAT) 0.4 MG SL tablet Place 1 tablet (0.4 mg total) under the tongue every 5 (five) minutes as needed for chest pain. 10/12/17  Yes [name], [name], MD  spironolactone (ALDACTONE) 25 MG tablet Take 25 mg by mouth 2 (two) times daily.    Yes [provider]  timolol  (BETIMOL) 0.5 % ophthalmic solution Place 1 drop into both eyes daily.   Yes [provider]  mupirocin ointment (BACTROBAN) 2 % Apply to affected area 3 times daily 07/11/18 07/11/19  Kem Boroughsriplett, Jidenna Figgs B, FNP    Allergies Codeine  Family History  Problem Relation Age of Onset  . Heart attack Father     Social History Social History   Tobacco Use  . Smoking status: Never Smoker  . Smokeless tobacco: Never Used  Substance Use Topics  . Alcohol use: No  . Drug use: No    Review of Systems  Constitutional: No fever/chills Eyes: No visual changes. ENT: No sore throat. Positive for lesions inside nose. Cardiovascular: Denies chest pain. Respiratory: Denies shortness of breath. Gastrointestinal: No abdominal pain.  No nausea, no vomiting.  No diarrhea.  No constipation. Genitourinary: Negative for dysuria. Musculoskeletal: Negative for back pain. Skin: Negative for rash. Neurological: Negative for headaches, focal weakness, positive for transient numbness. ____________________________________________   PHYSICAL EXAM:  VITAL SIGNS: ED Triage Vitals  Enc Vitals Group     BP 07/11/18 1409 (!) 145/80     Pulse Rate 07/11/18 1409 (!) 57     Resp 07/11/18 1409 16     Temp 07/11/18 1409 98 F (36.7 C)     Temp Source 07/11/18 1409 Oral     SpO2 07/11/18 1409 98 %     Weight --      Height --      Head Circumference --      Peak Flow --      Pain Score 07/11/18 1410 0     Pain Loc --      Pain Edu? --      Excl. in GC? --     Constitutional: Alert and oriented. Well appearing and in no acute distress. Eyes: Conjunctivae are normal. PERRL. EOMI. Head: Atraumatic. Nose: No congestion/rhinnorhea. Crusted, erythematous lesions in the nostrils bilaterally. Mouth/Throat: Mucous membranes are moist.  Oropharynx non-erythematous. Neck: No stridor.   Cardiovascular: Normal rate, regular rhythm. Grossly normal heart sounds.  Good peripheral circulation. Respiratory:  Normal respiratory effort.  No retractions. Lungs CTAB. Gastrointestinal: Soft and nontender. No distention. No abdominal bruits. No CVA tenderness. Musculoskeletal: No lower extremity tenderness nor edema.  No joint effusions. Neurologic:  Normal speech and language. No gross focal neurologic deficits are appreciated. No gait instability. Skin:  Chronic appearing lesions noted over the subscapular area on the right back. No surrounding erythema or lymphangitis. No drainage. Psychiatric: Mood and affect are normal. Speech and behavior are normal.  ____________________________________________   LABS (all labs ordered are listed, but only abnormal results are displayed)  Labs Reviewed  BASIC METABOLIC PANEL - Abnormal; Notable for the following components:      Result Value   Glucose, Bld 110 (*)    BUN 30 (*)    All other components within normal limits  URINALYSIS, COMPLETE (UACMP) WITH MICROSCOPIC - Abnormal; Notable for the following components:   Color, Urine  YELLOW (*)    APPearance CLEAR (*)    All other components within normal limits  CBC  TROPONIN I (HIGH SENSITIVITY)  TROPONIN I (HIGH SENSITIVITY)   ____________________________________________  EKG  ED ECG REPORT I, Haidar Muse, FNP-BC personally viewed and interpreted this ECG.   Date: 07/11/2018  EKG Time: 1420  Rate: 55  Rhythm: sinus bradycardia  Axis: normal  Intervals:right bundle branch block  ST&T Change: no ST elevation  ____________________________________________  RADIOLOGY  ED MD interpretation:   Chest x-ray shows no acute cardiopulmonary abnormality.  CT of the head shows no changes since prior CT.  Official radiology report(s): Dg Chest 2 View  Result Date: 07/11/2018 CLINICAL DATA:  Near syncopal episodes. EXAM: CHEST - 2 VIEW COMPARISON:  Chest x-ray 01/01/2016 FINDINGS: The cardiac silhouette, mediastinal and hilar contours are normal. The lungs are clear. No pleural effusion. No  worrisome pulmonary lesions. The bony thorax is intact. IMPRESSION: No acute cardiopulmonary findings. Electronically Signed   By: Marijo Sanes M.D.   On: 07/11/2018 18:12   Ct Head Wo Contrast  Result Date: 07/11/2018 CLINICAL DATA:  Episodic dizziness and nausea. EXAM: CT HEAD WITHOUT CONTRAST TECHNIQUE: Contiguous axial images were obtained from the base of the skull through the vertex without intravenous contrast. COMPARISON:  02/01/2018 FINDINGS: Brain: The ventricles are normal in size and configuration. No extra-axial fluid collections are identified. The gray-white differentiation is maintained. No CT findings for acute hemispheric infarction or intracranial hemorrhage. No mass lesions. The brainstem and cerebellum are normal. Vascular: No hyperdense vessels or obvious aneurysm. Skull: No acute skull fracture.  No bone lesion. Sinuses/Orbits: The paranasal sinuses and mastoid air cells are clear except for a mucous retention cyst or polyp in the right maxillary sinus. Is also a small amount of fluid in the left half of the sphenoid sinus which is stable. The globes are intact. Other: No scalp lesions, laceration or hematoma. IMPRESSION: 1. No acute intracranial findings or mass lesion. 2. No changes since prior CT. Electronically Signed   By: Marijo Sanes M.D.   On: 07/11/2018 18:10    ____________________________________________   PROCEDURES  Procedure(s) performed: None  Procedures  Critical Care performed: No  ____________________________________________   INITIAL IMPRESSION / ASSESSMENT AND PLAN / ED COURSE    73 year old female presenting to the emergency department with multiple medical complaints.  Patient states that she lives alone and anytime she feels something different, she gets very concerned about herself and pushes her med alert button.  She states that Saturday, she was out working in her yard picking up [place] that had fallen and is not sure whether she  had just squatted too long or what caused her right leg to feel numb, but states that the feeling lasted a few seconds and then went away.  After working outside, she noticed that her nose was "stuffy" and since then has felt very dry.  Patient states that she became very concerned about herself and started googling her symptoms.  This caused her to feel anxious and she began to pick at some sores that have been on the right side of her back for "a long time."  Today, she denies any numbness or tingling in the lower extremity.  She denies any focal weakness, but states that she just kind of feels generally weak today.  She denies any chest pain or shortness of breath.  Work-up will include troponin and CT of her head, although I do not feel that her  current symptoms are cardiac or TIA/CVA related.  ----------------------------------------- 7:46 PM on 07/11/2018 -----------------------------------------  Dr. Roxan Hockeyobinson in to evaluate patient for discharge. Labs, chest x-ray, and head CT are all reassuring. Her BUN is elevated to 30, but this is unchanged from her labs over the last several months. Her creatinine is normal. For the skin lesions I will prescribe mupirocin ointment as this likely has some MRSA component to it. She is to follow up with primary care if the lesions do not heal/improve. She is to return to the ER for symptoms that change or worsen or for new concerns if unable to schedule an appointment. ____________________________________________   FINAL CLINICAL IMPRESSION(S) / ED DIAGNOSES  Final diagnoses:  Generalized weakness  Skin picking habit  Skin lesion of back     ED Discharge Orders         Ordered    mupirocin ointment (BACTROBAN) 2 %     07/11/18 1954           Note:  This document was prepared using Dragon voice recognition software and may include unintentional dictation errors.    Chinita Pesterriplett, Ryan Palermo B, FNP 07/11/18 Corky Crafts1955    Willy Eddyobinson, Patrick, MD 07/11/18  2239

## 2018-07-11 NOTE — ED Triage Notes (Signed)
Says Saturday whe was outside in garden and had episode of numbness to leg (could move it) and felt dizzy and nausea.  The numbness lasted seconds.  She had ems come and they did normal ekg and she decided not to come in.  She saysshe has had an episode of this each day.  Also says her bp was up.  She went to [facility] for a rash/skin infection today and they brought her here.

## 2018-07-11 NOTE — ED Notes (Signed)
Trop drawn and sent

## 2018-07-11 NOTE — ED Notes (Signed)
Reports numbness and heaviness to left foot over the weekend after working in her yard. Numbness continued and went to urgy care to be evaluated based on symptoms, sent to ed for further eval from [facility].

## 2018-07-17 ENCOUNTER — Other Ambulatory Visit: Payer: Self-pay

## 2018-07-17 ENCOUNTER — Ambulatory Visit
Admission: RE | Admit: 2018-07-17 | Discharge: 2018-07-17 | Disposition: A | Discharge status: Home / Self Care | Payer: Medicare Other | Source: Ambulatory Visit | Attending: Internal Medicine | Admitting: Internal Medicine

## 2018-07-17 DIAGNOSIS — Z1231 Encounter for screening mammogram for malignant neoplasm of breast: Secondary | ICD-10-CM | POA: Diagnosis present

## 2019-10-02 ENCOUNTER — Other Ambulatory Visit: Payer: Self-pay | Admitting: Internal Medicine

## 2019-10-02 DIAGNOSIS — Z1231 Encounter for screening mammogram for malignant neoplasm of breast: Secondary | ICD-10-CM

## 2019-10-09 ENCOUNTER — Ambulatory Visit
Admission: RE | Admit: 2019-10-09 | Discharge: 2019-10-09 | Disposition: A | Discharge status: Home / Self Care | Payer: Medicare Other | Source: Ambulatory Visit | Attending: Internal Medicine | Admitting: Internal Medicine

## 2019-10-09 ENCOUNTER — Other Ambulatory Visit: Payer: Self-pay

## 2019-10-09 DIAGNOSIS — Z1231 Encounter for screening mammogram for malignant neoplasm of breast: Secondary | ICD-10-CM | POA: Diagnosis present

## 2019-11-09 ENCOUNTER — Other Ambulatory Visit: Payer: Self-pay | Admitting: Internal Medicine

## 2019-11-09 DIAGNOSIS — N1832 Chronic kidney disease, stage 3b: Secondary | ICD-10-CM

## 2019-11-15 ENCOUNTER — Ambulatory Visit
Admission: RE | Admit: 2019-11-15 | Discharge: 2019-11-15 | Disposition: A | Discharge status: Home / Self Care | Payer: Medicare Other | Source: Ambulatory Visit | Attending: Internal Medicine | Admitting: Internal Medicine

## 2019-11-15 ENCOUNTER — Other Ambulatory Visit: Payer: Self-pay

## 2019-11-15 DIAGNOSIS — N1832 Chronic kidney disease, stage 3b: Secondary | ICD-10-CM | POA: Insufficient documentation

## 2020-05-14 ENCOUNTER — Ambulatory Visit: Payer: Medicare Other | Attending: Internal Medicine | Admitting: Physical Therapy

## 2020-05-14 ENCOUNTER — Encounter: Payer: Self-pay | Admitting: Physical Therapy

## 2020-05-14 ENCOUNTER — Other Ambulatory Visit: Payer: Self-pay

## 2020-05-14 DIAGNOSIS — M6281 Muscle weakness (generalized): Secondary | ICD-10-CM

## 2020-05-14 DIAGNOSIS — G8929 Other chronic pain: Secondary | ICD-10-CM | POA: Diagnosis present

## 2020-05-14 DIAGNOSIS — M25662 Stiffness of left knee, not elsewhere classified: Secondary | ICD-10-CM | POA: Diagnosis present

## 2020-05-14 DIAGNOSIS — M25661 Stiffness of right knee, not elsewhere classified: Secondary | ICD-10-CM | POA: Insufficient documentation

## 2020-05-14 DIAGNOSIS — M25562 Pain in left knee: Secondary | ICD-10-CM | POA: Diagnosis present

## 2020-05-14 DIAGNOSIS — M25561 Pain in right knee: Secondary | ICD-10-CM | POA: Diagnosis present

## 2020-05-14 DIAGNOSIS — R269 Unspecified abnormalities of gait and mobility: Secondary | ICD-10-CM | POA: Insufficient documentation

## 2020-05-17 NOTE — Therapy (Addendum)
[facility] [address] [place], [place], [address] Phone: [phone]   Fax:  [phone]  Physical Therapy Evaluation  Patient Details  Name: Joanne Lewis MRN: [id number] Date of Birth: [birth year] Referring Provider (PT): Dr. Einar Crow   Encounter Date: 05/14/2020     PT End of Session - 05/17/20 1354    Visit Number 1    Number of Visits 17    Date for PT Re-Evaluation 07/09/20    Authorization - Visit Number 1    Authorization - Number of Visits 10    PT Start Time 0924    PT Stop Time 1020    PT Time Calculation (min) 56 min    Activity Tolerance Patient tolerated treatment well    Behavior During Therapy [facility] for tasks assessed/performed             Past Medical History:  Diagnosis Date  . 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HCC)   . Anginal pain (HCC)   . Arthritis   . Chronic kidney disease    moderate  . Fatigue   . 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 Hyperkalemia   . Hyperlipidemia   . Hypertension   . Pneumonia    at least 10 years ago  . Pre-diabetes     Past Surgical History:  Procedure Laterality Date  . COLONOSCOPY WITH PROPOFOL N/A 08/09/2016   Procedure: COLONOSCOPY WITH PROPOFOL;  Surgeon: Scot Jun, MD;  Location: [facility] ENDOSCOPY;  Service: Endoscopy;  Laterality: N/A;  . DILATATION & CURETTAGE/HYSTEROSCOPY WITH MYOSURE N/A 02/20/2018   Procedure: DILATATION & CURETTAGE/HYSTEROSCOPY WITH MYOSURE;  Surgeon: Schermerhorn, Ihor Austin, MD;  Location: [facility] ORS;  Service: Gynecology;  Laterality: N/A;  . DILATION AND CURETTAGE OF UTERUS N/A 02/20/2018   Procedure: DILATATION AND CURETTAGE, fractional, resection of endometrial mass, possible myosure, hysteroscopy;  Surgeon: Schermerhorn, Ihor Austin, MD;  Location: [facility] ORS;  Service: Gynecology;  Laterality: N/A;  . FEMORAL HERNIA REPAIR Right 11/26/2017   Procedure: HERNIA REPAIR FEMORAL with insertion  or mesh;  Surgeon: Ancil Linsey, MD;  Location: [facility] ORS;  Service: General;  Laterality: Right;  . HYSTEROSCOPY WITH D & C N/A 02/20/2018   Procedure: DILATATION AND CURETTAGE /HYSTEROSCOPY,;  Surgeon: Schermerhorn, Ihor Austin, MD;  Location: [facility] ORS;  Service: Gynecology;  Laterality: N/A;    There were no vitals filed for this visit.    Subjective Assessment - 05/24/20 1109    Subjective Pt. referred to PT with c/o B LE muscle weakness/ knee joint stiffness/ gait difficulty.  Pts. states she was recommended to have B TKA in 2003 due to severe knee OA.  Pt. states she is ready to take care of herself and decrease knee pain/ gait limitations.  Pt. reports no knee pain at rest in seated posture but >9/10 with prolonged standing/ walking.  Pt. reports her knees have been limited with extension for over 3 years.    Pertinent History Pt. states she does not use assistive device.  No h/o falls.  Pt. currently not participating with exercise but interested in starting.  Pt. sleeps in bed.    Limitations Standing;Walking;House hold activities    How long can you stand comfortably? <5 min.    How long can you walk comfortably? limited due to B quad fatigue/ increase knee pain (no assistive device).    Patient Stated Goals Prepare for B TKA/ increase quad strength/ pain mgmt.  Currently in Pain? No/denies                See flowsheet    Objective measurements completed on examination: See above findings.     Nustep L4 10 min. (consistent cadence)- discussed aquatic ex. Supine bridging/ SLR 10x each.  See HEP.       PT Education - 05/24/20 1117    Education Details Discussed HEP/ benefits of aquatic ex.    Person(s) Educated Patient    Methods Explanation;Demonstration;Handout    Comprehension Verbalized understanding;Returned demonstration               PT Long Term Goals - 05/24/20 1134      PT LONG TERM GOAL #1   Title Pt. independent with HEP to increase B knee  extension to <-20 deg. to improve standing posture/ gait pattern.    Baseline significant knee joint AROM limitations: L (-25 to 112 deg.), R (-26 to 104 deg.)    Time 8    Period Weeks    Status New    Target Date 07/09/20      PT LONG TERM GOAL #2   Title Pt. able to stand/ walking >10 minutes with no increase c/o knee pain or rest breaks.    Baseline Pt. limited to <5 minutes of standing    Time 8    Period Weeks    Status New    Target Date 07/09/20      PT LONG TERM GOAL #3   Title Pt. able to complete aquatic exercise program with no increase c/o knee pain or limitations to prepare of TKA.    Baseline pt. currently not exercising.    Time 8    Period Weeks    Status New    Target Date 07/09/20      PT LONG TERM GOAL #4   Title Pt. will complete FOTO and score goal to improve pain-free mobility.    Baseline TBD    Time 8    Period Weeks    Status New    Target Date 07/09/20                  Plan - 05/24/20 1121    Clinical Impression Statement Pt. is a pleasant 75 y/o female with chronic h/o B knee OA and limitations in B knee extension/ LE strength.  Pt. reports no knee pain at rest in seated posture and >9/10 knee pain with prolonged standing/ walking.  Pt. presents with significant knee joint AROM limitations: L (-25 to 112 deg.), R (-26 to 104 deg.).  Moderate L lateral knee pain/ unable to palpate B knee joint line.  B hip AROM WFL and muscle weakness noted: hip flexion 4/5 MMT, quad 5/5 MMT, hamstring 4+/5 MMT.  Pt. ambulates with antalgic gait due to significant B knee extension limitations.  No h/o falls or use of assistive device.  Pt. will benefit from skilled PT services to increase B knee ROM/ LE strengthening to improve standing tolerance prior to TKA.    Stability/Clinical Decision Making Evolving/Moderate complexity    Clinical Decision Making Moderate    Rehab Potential Good    PT Frequency 2x / week    PT Duration 8 weeks    PT  Treatment/Interventions ADLs/Self Care Home Management;Aquatic Therapy;Cryotherapy;Moist Heat;Gait training;DME Instruction;Neuromuscular re-education;Balance training;Therapeutic exercise;Therapeutic activities;Functional mobility training;Stair training;Patient/family education;Manual techniques;Passive range of motion    PT Next Visit Plan Progress HEP/ issued pool class schedule    Consulted and  Agree with Plan of Care Patient           Patient will benefit from skilled therapeutic intervention in order to improve the following deficits and impairments:  Abnormal gait,Decreased range of motion,Difficulty walking,Impaired tone,Decreased endurance,Obesity,Pain,Decreased activity tolerance,Decreased balance,Hypomobility,Impaired flexibility,Improper body mechanics,Postural dysfunction,Decreased mobility,Decreased strength  Visit Diagnosis: Knee joint stiffness, bilateral  Muscle weakness (generalized)  Gait difficulty  Chronic pain of both knees     Problem List Patient Active Problem List   Diagnosis Date Noted  . SOB (shortness of breath) 10/10/2017  . Coronary artery calcification 09/19/2016  . Chest pain 02/03/2016  . Elevated troponin 02/03/2016  . Anxiety 02/03/2016  . Essential hypertension 02/03/2016  . Mixed hyperlipidemia 02/03/2016  . Chest tightness 01/01/2016   Cammie Mcgee, PT, DPT # [phone] 05/24/2020, 11:39 AM  [facility] [address] [place], [place], [address] Phone: [phone]   Fax:  [phone]  Name: MARELLY WEHRMAN MRN: [id number] Date of Birth: [birth year]

## 2020-05-20 ENCOUNTER — Ambulatory Visit: Payer: Medicare Other | Admitting: Physical Therapy

## 2020-05-20 ENCOUNTER — Other Ambulatory Visit: Payer: Self-pay

## 2020-05-20 DIAGNOSIS — M25661 Stiffness of right knee, not elsewhere classified: Secondary | ICD-10-CM

## 2020-05-20 DIAGNOSIS — M25662 Stiffness of left knee, not elsewhere classified: Secondary | ICD-10-CM

## 2020-05-20 DIAGNOSIS — M6281 Muscle weakness (generalized): Secondary | ICD-10-CM

## 2020-05-20 DIAGNOSIS — M25561 Pain in right knee: Secondary | ICD-10-CM

## 2020-05-20 DIAGNOSIS — R269 Unspecified abnormalities of gait and mobility: Secondary | ICD-10-CM

## 2020-05-20 DIAGNOSIS — G8929 Other chronic pain: Secondary | ICD-10-CM

## 2020-05-20 NOTE — Patient Instructions (Signed)
Access Code: [email or web address] URL: [email or web address] Date: 05/20/2020 Prepared by: Dorene Grebe  Exercises Supine Transversus Abdominis Bracing - Hands on Ground - 1 x daily - 7 x weekly - 1 sets - 3 reps - 10 sec. hold Supine Gluteal Sets - 1 x daily - 7 x weekly - 2 sets - 10 reps - 5 sec. hold Supine Quad Set on Towel Roll - 1 x daily - 7 x weekly - 2 sets - 10 reps - 10 sec. hold Supine Short Arc Quad - 1 x daily - 7 x weekly - 2 sets - 10 reps Standing Hip Abduction with Counter Support - 1 x daily - 7 x weekly - 2 sets - 10 reps Standing Hip Extension with Counter Support - 1 x daily - 7 x weekly - 2 sets - 10 reps

## 2020-05-22 ENCOUNTER — Other Ambulatory Visit: Payer: Self-pay

## 2020-05-22 ENCOUNTER — Ambulatory Visit: Payer: Medicare Other | Admitting: Physical Therapy

## 2020-05-22 DIAGNOSIS — M25662 Stiffness of left knee, not elsewhere classified: Secondary | ICD-10-CM

## 2020-05-22 DIAGNOSIS — M25661 Stiffness of right knee, not elsewhere classified: Secondary | ICD-10-CM | POA: Diagnosis not present

## 2020-05-22 DIAGNOSIS — G8929 Other chronic pain: Secondary | ICD-10-CM

## 2020-05-22 DIAGNOSIS — M6281 Muscle weakness (generalized): Secondary | ICD-10-CM

## 2020-05-22 DIAGNOSIS — M25562 Pain in left knee: Secondary | ICD-10-CM

## 2020-05-22 DIAGNOSIS — R269 Unspecified abnormalities of gait and mobility: Secondary | ICD-10-CM

## 2020-05-24 NOTE — Addendum Note (Signed)
Addended by: Dorene Grebe C on: 05/24/2020 11:42 AM   Modules accepted: Orders

## 2020-05-25 NOTE — Therapy (Signed)
[place] [facility] [address] [place], [place], [address] Phone: [phone]   Fax:  [phone]  Physical Therapy Treatment  Patient Details  Name: Joanne Lewis MRN: [id number] Date of Birth: 08-10-1945 Referring Provider (PT): Dr. Einar Crow   Encounter Date: 05/20/2020   PT End of Session - 05/25/20 0855    Visit Number 2    Number of Visits 17    Date for PT Re-Evaluation 07/09/20    Authorization - Visit Number 2    Authorization - Number of Visits 10    PT Start Time 0933    PT Stop Time 1025    PT Time Calculation (min) 52 min    Activity Tolerance Patient tolerated treatment well    Behavior During Therapy [facility] for tasks assessed/performed           Past Medical History:  Diagnosis Date  . 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HCC)   . Anginal pain (HCC)   . Arthritis   . Chronic kidney disease    moderate  . Fatigue   . 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 Hyperkalemia   . Hyperlipidemia   . Hypertension   . Pneumonia    at least 10 years ago  . Pre-diabetes     Past Surgical History:  Procedure Laterality Date  . COLONOSCOPY WITH PROPOFOL N/A 08/09/2016   Procedure: COLONOSCOPY WITH PROPOFOL;  Surgeon: Scot Jun, MD;  Location: [facility] ENDOSCOPY;  Service: Endoscopy;  Laterality: N/A;  . DILATATION & CURETTAGE/HYSTEROSCOPY WITH MYOSURE N/A 02/20/2018   Procedure: DILATATION & CURETTAGE/HYSTEROSCOPY WITH MYOSURE;  Surgeon: Schermerhorn, Ihor Austin, MD;  Location: [facility] ORS;  Service: Gynecology;  Laterality: N/A;  . DILATION AND CURETTAGE OF UTERUS N/A 02/20/2018   Procedure: DILATATION AND CURETTAGE, fractional, resection of endometrial mass, possible myosure, hysteroscopy;  Surgeon: Schermerhorn, Ihor Austin, MD;  Location: [facility] ORS;  Service: Gynecology;  Laterality: N/A;  . FEMORAL HERNIA REPAIR Right 11/26/2017   Procedure: HERNIA REPAIR FEMORAL with insertion or  mesh;  Surgeon: Ancil Linsey, MD;  Location: [facility] ORS;  Service: General;  Laterality: Right;  . HYSTEROSCOPY WITH D & C N/A 02/20/2018   Procedure: DILATATION AND CURETTAGE /HYSTEROSCOPY,;  Surgeon: Schermerhorn, Ihor Austin, MD;  Location: [facility] ORS;  Service: Gynecology;  Laterality: N/A;    There were no vitals filed for this visit.   Subjective Assessment - 05/25/20 0854    Subjective Pt. states she has been really limited with standing/walking due to B quad fatigue/pain.  Pt. entered PT with significant B knee flexion gait pattern.  No pain at rest.    Pertinent History Pt. states she does not use assistive device.  No h/o falls.  Pt. currently not participating with exercise but interested in starting.  Pt. sleeps in bed.    Limitations Standing;Walking;House hold activities    How long can you stand comfortably? <5 min.    How long can you walk comfortably? limited due to B quad fatigue/ increase knee pain (no assistive device).    Patient Stated Goals Prepare for B TKA/ increase quad strength/ pain mgmt.    Currently in Pain? No/denies           There.ex.:  Nustep L4 10 min. B UE/LE (consistent cadence) See HEP (handouts)- SAQ/ quad sets with manual feedback/ bolster bridging/ SLR/ knee to chest 20x. Walking in //-bars: high marching/ lateral walking 5x in //-bars.  Banker.  Manual  tx.:  Supine LE/ hip stretches (focus on knee flexion/ extension) Supine L/R LE LAD 3x (gentle oscillations) Assessment of patellar mobility (hypomobile)       PT Education - 05/25/20 0855    Education Details Access Code: [email or web address]    Person(s) Educated Patient    Methods Explanation;Demonstration;Handout    Comprehension Verbalized understanding;Returned demonstration               PT Long Term Goals - 05/24/20 1134      PT LONG TERM GOAL #1   Title Pt. independent with HEP to increase B knee extension to <-20 deg. to improve standing posture/ gait pattern.     Baseline significant knee joint AROM limitations: L (-25 to 112 deg.), R (-26 to 104 deg.)    Time 8    Period Weeks    Status New    Target Date 07/09/20      PT LONG TERM GOAL #2   Title Pt. able to stand/ walking >10 minutes with no increase c/o knee pain or rest breaks.    Baseline Pt. limited to <5 minutes of standing    Time 8    Period Weeks    Status New    Target Date 07/09/20      PT LONG TERM GOAL #3   Title Pt. able to complete aquatic exercise program with no increase c/o knee pain or limitations to prepare of TKA.    Baseline pt. currently not exercising.    Time 8    Period Weeks    Status New    Target Date 07/09/20      PT LONG TERM GOAL #4   Title Pt. will complete FOTO and score goal to improve pain-free mobility.    Baseline TBD    Time 8    Period Weeks    Status New    Target Date 07/09/20                 Plan - 05/25/20 0856    Clinical Impression Statement Pt. works hard during tx. session with focus on core/ LE muscle strengthening and knee stretching.  Pt. has significant B knee hypomobility/ limited knee extension.  Pain limited with knee flexion/ extension AA/PROM.  Pt.  understands HEP and interested in starting aquatic based ex. program with friend.  Pt. will research pool schedules/ gyms.  Light UE assist in //-bars during standing therex./ lateral walking to decrease fatigue/ pain in B LE.    Stability/Clinical Decision Making Evolving/Moderate complexity    Clinical Decision Making Moderate    Rehab Potential Good    PT Frequency 2x / week    PT Duration 8 weeks    PT Treatment/Interventions ADLs/Self Care Home Management;Aquatic Therapy;Cryotherapy;Moist Heat;Gait training;DME Instruction;Neuromuscular re-education;Balance training;Therapeutic exercise;Therapeutic activities;Functional mobility training;Stair training;Patient/family education;Manual techniques;Passive range of motion    PT Next Visit Plan Progress HEP/ issued pool class  schedule    PT Home Exercise Plan Access Code: [email or web address]    Consulted and Agree with Plan of Care Patient           Patient will benefit from skilled therapeutic intervention in order to improve the following deficits and impairments:  Abnormal gait,Decreased range of motion,Difficulty walking,Impaired tone,Decreased endurance,Obesity,Pain,Decreased activity tolerance,Decreased balance,Hypomobility,Impaired flexibility,Improper body mechanics,Postural dysfunction,Decreased mobility,Decreased strength  Visit Diagnosis: Knee joint stiffness, bilateral  Muscle weakness (generalized)  Gait difficulty  Chronic pain of both knees     Problem List Patient Active Problem List   Diagnosis Date Noted  .  SOB (shortness of breath) 10/10/2017  . Coronary artery calcification 09/19/2016  . Chest pain 02/03/2016  . Elevated troponin 02/03/2016  . Anxiety 02/03/2016  . Essential hypertension 02/03/2016  . Mixed hyperlipidemia 02/03/2016  . Chest tightness 01/01/2016   Cammie Mcgee, PT, DPT # [id number] 05/25/2020, 9:04 AM  [facility] [address] [place], [place], [address] Phone: [phone]   Fax:  [phone]  Name: AUDRY KAUZLARICH MRN: [id number] Date of Birth: 05/13/1945

## 2020-05-27 ENCOUNTER — Ambulatory Visit: Payer: Medicare Other | Admitting: Physical Therapy

## 2020-05-27 ENCOUNTER — Encounter: Payer: Self-pay | Admitting: Physical Therapy

## 2020-05-27 ENCOUNTER — Other Ambulatory Visit: Payer: Self-pay

## 2020-05-27 DIAGNOSIS — R269 Unspecified abnormalities of gait and mobility: Secondary | ICD-10-CM

## 2020-05-27 DIAGNOSIS — M25662 Stiffness of left knee, not elsewhere classified: Secondary | ICD-10-CM

## 2020-05-27 DIAGNOSIS — M25661 Stiffness of right knee, not elsewhere classified: Secondary | ICD-10-CM | POA: Diagnosis not present

## 2020-05-27 DIAGNOSIS — M6281 Muscle weakness (generalized): Secondary | ICD-10-CM

## 2020-05-27 DIAGNOSIS — G8929 Other chronic pain: Secondary | ICD-10-CM

## 2020-05-27 NOTE — Therapy (Signed)
[facility] [address] [place], [place], [address] Phone: [phone]   Fax:  [phone]  Physical Therapy Treatment  Patient Details  Name: Joanne Lewis MRN: [id number] Date of Birth: 1945-12-06 Referring Provider (PT): Dr. Einar Crow   Encounter Date: 05/27/2020   PT End of Session - 05/27/20 0853    Visit Number 4    Number of Visits 17    Date for PT Re-Evaluation 07/09/20    Authorization - Visit Number 4    Authorization - Number of Visits 10    PT Start Time 0853    PT Stop Time 0946    PT Time Calculation (min) 53 min    Activity Tolerance Patient tolerated treatment well    Behavior During Therapy [facility] for tasks assessed/performed           Past Medical History:  Diagnosis Date  . 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HCC)   . Anginal pain (HCC)   . Arthritis   . Chronic kidney disease    moderate  . Fatigue   . 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 Hyperkalemia   . Hyperlipidemia   . Hypertension   . Pneumonia    at least 10 years ago  . Pre-diabetes     Past Surgical History:  Procedure Laterality Date  . COLONOSCOPY WITH PROPOFOL N/A 08/09/2016   Procedure: COLONOSCOPY WITH PROPOFOL;  Surgeon: Scot Jun, MD;  Location: [facility] ENDOSCOPY;  Service: Endoscopy;  Laterality: N/A;  . DILATATION & CURETTAGE/HYSTEROSCOPY WITH MYOSURE N/A 02/20/2018   Procedure: DILATATION & CURETTAGE/HYSTEROSCOPY WITH MYOSURE;  Surgeon: Schermerhorn, Ihor Austin, MD;  Location: [facility] ORS;  Service: Gynecology;  Laterality: N/A;  . DILATION AND CURETTAGE OF UTERUS N/A 02/20/2018   Procedure: DILATATION AND CURETTAGE, fractional, resection of endometrial mass, possible myosure, hysteroscopy;  Surgeon: Schermerhorn, Ihor Austin, MD;  Location: [facility] ORS;  Service: Gynecology;  Laterality: N/A;  . FEMORAL HERNIA REPAIR Right 11/26/2017   Procedure: HERNIA REPAIR FEMORAL with insertion or  mesh;  Surgeon: Ancil Linsey, MD;  Location: [facility] ORS;  Service: General;  Laterality: Right;  . HYSTEROSCOPY WITH D & C N/A 02/20/2018   Procedure: DILATATION AND CURETTAGE /HYSTEROSCOPY,;  Surgeon: Schermerhorn, Ihor Austin, MD;  Location: [facility] ORS;  Service: Gynecology;  Laterality: N/A;    There were no vitals filed for this visit.   Subjective Assessment - 05/27/20 0853    Subjective Pt. reports no knee pain prior to using Nustep today.  Pt. states she was able to weed garden this past weekend.    Pertinent History Pt. states she does not use assistive device.  No h/o falls.  Pt. currently not participating with exercise but interested in starting.  Pt. sleeps in bed.    Limitations Standing;Walking;House hold activities    How long can you stand comfortably? <5 min.    How long can you walk comfortably? limited due to B quad fatigue/ increase knee pain (no assistive device).    Patient Stated Goals Prepare for B TKA/ increase quad strength/ pain mgmt.    Currently in Pain? No/denies            There.ex.:  Nustep L5 10 min. B UE/LE (consistent cadence/ slight increase in resistance)- discussed weekend activity.   Seated position 7-9 with knee extension focus.    Standing 2nd knee flexion/ hamstring stretches 5x (at stairs with handrail support).    Hallway walking 3 laps  with cuing for consistent step pattern/ heel strike.  Alt. UE/LE touches in hallway (CGA for support).   5# LE ex.: seated marching/ LAQ/ toe raises 20x.  Standing hip extension/ abduction/ marching/ heel raises 20x each.    STS from gray chair 10x with no UE assist.    Supine SAQ/ quad sets with manual feedback/ bridging/ knee to chest/ bicycles 20x.   Manual tx.:  Supine LE/ hip stretches (focus on knee flexion/ extension)- STM to distal quad/ hamstring  Supine L/R LE LAD 3x (gentle oscillations)  Assessment of patellar mobility (hypomobile)       PT Long Term Goals - 05/24/20 1134       PT LONG TERM GOAL #1   Title Pt. independent with HEP to increase B knee extension to <-20 deg. to improve standing posture/ gait pattern.    Baseline significant knee joint AROM limitations: L (-25 to 112 deg.), R (-26 to 104 deg.)    Time 8    Period Weeks    Status New    Target Date 07/09/20      PT LONG TERM GOAL #2   Title Pt. able to stand/ walking >10 minutes with no increase c/o knee pain or rest breaks.    Baseline Pt. limited to <5 minutes of standing    Time 8    Period Weeks    Status New    Target Date 07/09/20      PT LONG TERM GOAL #3   Title Pt. able to complete aquatic exercise program with no increase c/o knee pain or limitations to prepare of TKA.    Baseline pt. currently not exercising.    Time 8    Period Weeks    Status New    Target Date 07/09/20      PT LONG TERM GOAL #4   Title Pt. will complete FOTO and score goal to improve pain-free mobility.    Baseline TBD    Time 8    Period Weeks    Status New    Target Date 07/09/20                 Plan - 05/27/20 0853    Clinical Impression Statement Pt. had B knee discomfort in seated posture after supine quad/ hamstring stretches.  Significant B knee/patellar hypomobility during supine manual tx.  Pt. works hard during standing/ supine ther.ex. with no increase c/o knee pain.  Increase LE resistance to 5# ankle wts. during ex. and increase cadence noted on Nustep.  No change to HEP at this time.  Pt. has MD appt. with Dr. Cherylann Ratel after next PT tx. session.    Stability/Clinical Decision Making Evolving/Moderate complexity    Clinical Decision Making Moderate    Rehab Potential Good    PT Frequency 2x / week    PT Duration 8 weeks    PT Treatment/Interventions ADLs/Self Care Home Management;Aquatic Therapy;Cryotherapy;Moist Heat;Gait training;DME Instruction;Neuromuscular re-education;Balance training;Therapeutic exercise;Therapeutic activities;Functional mobility training;Stair  training;Patient/family education;Manual techniques;Passive range of motion    PT Next Visit Plan Progress HEP    PT Home Exercise Plan Access Code: [email or web address]    Consulted and Agree with Plan of Care Patient           Patient will benefit from skilled therapeutic intervention in order to improve the following deficits and impairments:  Abnormal gait,Decreased range of motion,Difficulty walking,Impaired tone,Decreased endurance,Obesity,Pain,Decreased activity tolerance,Decreased balance,Hypomobility,Impaired flexibility,Improper body mechanics,Postural dysfunction,Decreased mobility,Decreased strength  Visit Diagnosis: Knee joint stiffness, bilateral  Muscle  weakness (generalized)  Gait difficulty  Chronic pain of both knees     Problem List Patient Active Problem List   Diagnosis Date Noted  . SOB (shortness of breath) 10/10/2017  . Coronary artery calcification 09/19/2016  . Chest pain 02/03/2016  . Elevated troponin 02/03/2016  . Anxiety 02/03/2016  . Essential hypertension 02/03/2016  . Mixed hyperlipidemia 02/03/2016  . Chest tightness 01/01/2016   Cammie Mcgee, PT, DPT # [id number] 05/27/2020, 11:54 AM  [facility] [address] [place], [place], [address] Phone: [phone]   Fax:  [phone]  Name: Joanne Lewis MRN: [id number] Date of Birth: 1945/09/16

## 2020-05-27 NOTE — Therapy (Signed)
[facility] [address] [place], [place], [address] Phone: [phone]   Fax:  [phone]  Physical Therapy Treatment  Patient Details  Name: Joanne Lewis MRN: [id number] Date of Birth: 03/06/1945 Referring Provider (PT): Dr. Einar Crow   Encounter Date: 05/22/2020   PT End of Session - 05/27/20 0836    Visit Number 3    Number of Visits 17    Date for PT Re-Evaluation 07/09/20    Authorization - Visit Number 3    Authorization - Number of Visits 10    PT Start Time 0802    PT Stop Time 0855    PT Time Calculation (min) 53 min    Activity Tolerance Patient tolerated treatment well    Behavior During Therapy [facility] for tasks assessed/performed           Past Medical History:  Diagnosis Date  . 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HCC)   . Anginal pain (HCC)   . Arthritis   . Chronic kidney disease    moderate  . Fatigue   . 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 Hyperkalemia   . Hyperlipidemia   . Hypertension   . Pneumonia    at least 10 years ago  . Pre-diabetes     Past Surgical History:  Procedure Laterality Date  . COLONOSCOPY WITH PROPOFOL N/A 08/09/2016   Procedure: COLONOSCOPY WITH PROPOFOL;  Surgeon: Scot Jun, MD;  Location: [facility] ENDOSCOPY;  Service: Endoscopy;  Laterality: N/A;  . DILATATION & CURETTAGE/HYSTEROSCOPY WITH MYOSURE N/A 02/20/2018   Procedure: DILATATION & CURETTAGE/HYSTEROSCOPY WITH MYOSURE;  Surgeon: Schermerhorn, Ihor Austin, MD;  Location: [facility] ORS;  Service: Gynecology;  Laterality: N/A;  . DILATION AND CURETTAGE OF UTERUS N/A 02/20/2018   Procedure: DILATATION AND CURETTAGE, fractional, resection of endometrial mass, possible myosure, hysteroscopy;  Surgeon: Schermerhorn, Ihor Austin, MD;  Location: [facility] ORS;  Service: Gynecology;  Laterality: N/A;  . FEMORAL HERNIA REPAIR Right 11/26/2017   Procedure: HERNIA REPAIR FEMORAL with insertion or  mesh;  Surgeon: Ancil Linsey, MD;  Location: [facility] ORS;  Service: General;  Laterality: Right;  . HYSTEROSCOPY WITH D & C N/A 02/20/2018   Procedure: DILATATION AND CURETTAGE /HYSTEROSCOPY,;  Surgeon: Schermerhorn, Ihor Austin, MD;  Location: [facility] ORS;  Service: Gynecology;  Laterality: N/A;    There were no vitals filed for this visit.   Subjective Assessment - 05/27/20 0832    Subjective Pt. reports no knee pain prior to PT tx. session.  Pt. states she has appt. to see Dr. Cherylann Ratel (5/19) next week and Ortho (5/25) the following week to discuss knee POC.  Pt. discussed [employer] program and that she is planning to start aquatic ex. on June 1st during the next billing cycle.    Pertinent History Pt. states she does not use assistive device.  No h/o falls.  Pt. currently not participating with exercise but interested in starting.  Pt. sleeps in bed.    Limitations Standing;Walking;House hold activities    How long can you stand comfortably? <5 min.    How long can you walk comfortably? limited due to B quad fatigue/ increase knee pain (no assistive device).    Patient Stated Goals Prepare for B TKA/ increase quad strength/ pain mgmt.    Currently in Pain? No/denies             There.ex.:  Nustep L4 10 min. B UE/LE (consistent cadence) Hallway walking 3  laps with cuing for consistent step pattern/ heel strike 4# LE ex.: seated marching/ LAQ/ toe raises 20x.  Standing hip extension/ abduction/ marching/ heel raises 20x each.   Supine SAQ/ quad sets with manual feedback (instruct to use 2 towel rolls)/ bolster bridging/ SLR/ knee to chest/ bicycles 20x. Discussed aquatic ex./ HEP  Manual tx.:  Supine LE/ hip stretches (focus on knee flexion/ extension) Supine L/R LE LAD 3x (gentle oscillations) Assessment of patellar mobility (hypomobile)      PT Long Term Goals - 05/24/20 1134      PT LONG TERM GOAL #1   Title Pt. independent with HEP to increase B knee extension to <-20  deg. to improve standing posture/ gait pattern.    Baseline significant knee joint AROM limitations: L (-25 to 112 deg.), R (-26 to 104 deg.)    Time 8    Period Weeks    Status New    Target Date 07/09/20      PT LONG TERM GOAL #2   Title Pt. able to stand/ walking >10 minutes with no increase c/o knee pain or rest breaks.    Baseline Pt. limited to <5 minutes of standing    Time 8    Period Weeks    Status New    Target Date 07/09/20      PT LONG TERM GOAL #3   Title Pt. able to complete aquatic exercise program with no increase c/o knee pain or limitations to prepare of TKA.    Baseline pt. currently not exercising.    Time 8    Period Weeks    Status New    Target Date 07/09/20      PT LONG TERM GOAL #4   Title Pt. will complete FOTO and score goal to improve pain-free mobility.    Baseline TBD    Time 8    Period Weeks    Status New    Target Date 07/09/20                 Plan - 05/27/20 0837    Clinical Impression Statement Pt. gait/ walking distance limited by increase B knee pain/ distal quad muscle fatigue due to knee extension limitations.  Significant B patellar/ knee joint hypomobility during stretches and manual tx.  Pt. motivated to increase knee ROM/ strengthening and pain mgmt.  Good tx. tolerance with 4# LE ex. program.  Unable to complete standing toe raises and modified to seated position due to knee extension limitations.    Stability/Clinical Decision Making Evolving/Moderate complexity    Clinical Decision Making Moderate    Rehab Potential Good    PT Frequency 2x / week    PT Duration 8 weeks    PT Treatment/Interventions ADLs/Self Care Home Management;Aquatic Therapy;Cryotherapy;Moist Heat;Gait training;DME Instruction;Neuromuscular re-education;Balance training;Therapeutic exercise;Therapeutic activities;Functional mobility training;Stair training;Patient/family education;Manual techniques;Passive range of motion    PT Next Visit Plan Progress  HEP    PT Home Exercise Plan Access Code: [email or web address]    Consulted and Agree with Plan of Care Patient           Patient will benefit from skilled therapeutic intervention in order to improve the following deficits and impairments:  Abnormal gait,Decreased range of motion,Difficulty walking,Impaired tone,Decreased endurance,Obesity,Pain,Decreased activity tolerance,Decreased balance,Hypomobility,Impaired flexibility,Improper body mechanics,Postural dysfunction,Decreased mobility,Decreased strength  Visit Diagnosis: Knee joint stiffness, bilateral  Muscle weakness (generalized)  Gait difficulty  Chronic pain of both knees     Problem List Patient Active Problem List   Diagnosis Date  Noted  . SOB (shortness of breath) 10/10/2017  . Coronary artery calcification 09/19/2016  . Chest pain 02/03/2016  . Elevated troponin 02/03/2016  . Anxiety 02/03/2016  . Essential hypertension 02/03/2016  . Mixed hyperlipidemia 02/03/2016  . Chest tightness 01/01/2016   Cammie Mcgee, PT, DPT # [phone] 05/27/2020, 8:49 AM  [place] [facility] [address] [place], [place], [address] Phone: [phone]   Fax:  [phone]  Name: Joanne Lewis MRN: [id number] Date of Birth: 1945/07/19

## 2020-05-29 ENCOUNTER — Encounter: Payer: Self-pay | Admitting: Physical Therapy

## 2020-05-29 ENCOUNTER — Ambulatory Visit: Payer: Medicare Other | Admitting: Physical Therapy

## 2020-05-29 ENCOUNTER — Other Ambulatory Visit: Payer: Self-pay

## 2020-05-29 DIAGNOSIS — G8929 Other chronic pain: Secondary | ICD-10-CM

## 2020-05-29 DIAGNOSIS — M25661 Stiffness of right knee, not elsewhere classified: Secondary | ICD-10-CM

## 2020-05-29 DIAGNOSIS — R269 Unspecified abnormalities of gait and mobility: Secondary | ICD-10-CM

## 2020-05-29 DIAGNOSIS — M25662 Stiffness of left knee, not elsewhere classified: Secondary | ICD-10-CM

## 2020-05-29 DIAGNOSIS — M6281 Muscle weakness (generalized): Secondary | ICD-10-CM

## 2020-05-29 NOTE — Therapy (Signed)
[place] [facility] [address] [place], [place], [address] Phone: [phone]   Fax:  [phone]  Physical Therapy Treatment  Patient Details  Name: Joanne Lewis MRN: [id number] Date of Birth: [birth year] Referring Provider (PT): Dr. Einar Crow   Encounter Date: 05/29/2020   PT End of Session - 05/29/20 0945    Visit Number 5    Number of Visits 17    Date for PT Re-Evaluation 07/09/20    Authorization - Visit Number 5    Authorization - Number of Visits 10    PT Start Time 0941    PT Stop Time 1030    PT Time Calculation (min) 49 min    Activity Tolerance Patient tolerated treatment well    Behavior During Therapy [facility] for tasks assessed/performed           Past Medical History:  Diagnosis Date  . 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HCC)   . Anginal pain (HCC)   . Arthritis   . Chronic kidney disease    moderate  . Fatigue   . 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 Hyperkalemia   . Hyperlipidemia   . Hypertension   . Pneumonia    at least 10 years ago  . Pre-diabetes     Past Surgical History:  Procedure Laterality Date  . COLONOSCOPY WITH PROPOFOL N/A 08/09/2016   Procedure: COLONOSCOPY WITH PROPOFOL;  Surgeon: Scot Jun, MD;  Location: [facility] ENDOSCOPY;  Service: Endoscopy;  Laterality: N/A;  . DILATATION & CURETTAGE/HYSTEROSCOPY WITH MYOSURE N/A 02/20/2018   Procedure: DILATATION & CURETTAGE/HYSTEROSCOPY WITH MYOSURE;  Surgeon: Schermerhorn, Ihor Austin, MD;  Location: [facility] ORS;  Service: Gynecology;  Laterality: N/A;  . DILATION AND CURETTAGE OF UTERUS N/A 02/20/2018   Procedure: DILATATION AND CURETTAGE, fractional, resection of endometrial mass, possible myosure, hysteroscopy;  Surgeon: Schermerhorn, Ihor Austin, MD;  Location: [facility] ORS;  Service: Gynecology;  Laterality: N/A;  . FEMORAL HERNIA REPAIR Right 11/26/2017   Procedure: HERNIA REPAIR FEMORAL with insertion or  mesh;  Surgeon: Ancil Linsey, MD;  Location: [facility] ORS;  Service: General;  Laterality: Right;  . HYSTEROSCOPY WITH D & C N/A 02/20/2018   Procedure: DILATATION AND CURETTAGE /HYSTEROSCOPY,;  Surgeon: Schermerhorn, Ihor Austin, MD;  Location: [facility] ORS;  Service: Gynecology;  Laterality: N/A;    There were no vitals filed for this visit.   Subjective Assessment - 05/29/20 0944    Subjective Pt. reports knee pain is "okay" this morning.  Pt. has appt. with Dr. Cherylann Ratel this afternoon.    Pertinent History Pt. states she does not use assistive device.  No h/o falls.  Pt. currently not participating with exercise but interested in starting.  Pt. sleeps in bed.    Limitations Standing;Walking;House hold activities    How long can you stand comfortably? <5 min.    How long can you walk comfortably? limited due to B quad fatigue/ increase knee pain (no assistive device).    Patient Stated Goals Prepare for B TKA/ increase quad strength/ pain mgmt.    Currently in Pain? No/denies              There.ex.:   Nustep L5 10 min. B UE/LE (consistent cadence/ slight increase in resistance)- discussed weekend activity.   Seated position 9 with knee extension focus.    TRX knee squats with gray chair 20x.    5# LE ex.: seated marching/ LAQ/ toe raises 20x.  Standing  hip extension/ abduction/ marching/ heel raises 20x each.  Forward/ backwards/ lateral walking in //-bars 5x each (5#).  Cuing for posture correction.   Walking alt. UE/LE touches in hallway (CGA for support).  No mistakes today.    Supine SAQ/ quad sets with manual feedback/ bridging/ knee to chest/ bicycles 20x.   Manual tx.:  Supine LE/ hip stretches (focus on knee flexion/ extension)- STM to distal quad/ hamstring  Supine L/R LE LAD 3x (gentle oscillations)   Reassessment of posture while walking in clinic/ outside.       PT Long Term Goals - 05/24/20 1134      PT LONG TERM GOAL #1   Title Pt. independent with  HEP to increase B knee extension to <-20 deg. to improve standing posture/ gait pattern.    Baseline significant knee joint AROM limitations: L (-25 to 112 deg.), R (-26 to 104 deg.)    Time 8    Period Weeks    Status New    Target Date 07/09/20      PT LONG TERM GOAL #2   Title Pt. able to stand/ walking >10 minutes with no increase c/o knee pain or rest breaks.    Baseline Pt. limited to <5 minutes of standing    Time 8    Period Weeks    Status New    Target Date 07/09/20      PT LONG TERM GOAL #3   Title Pt. able to complete aquatic exercise program with no increase c/o knee pain or limitations to prepare of TKA.    Baseline pt. currently not exercising.    Time 8    Period Weeks    Status New    Target Date 07/09/20      PT LONG TERM GOAL #4   Title Pt. will complete FOTO and score goal to improve pain-free mobility.    Baseline TBD    Time 8    Period Weeks    Status New    Target Date 07/09/20                 Plan - 05/29/20 0946    Clinical Impression Statement Pt. ambulates with improved upright posture while walking around PT clinic/ outside with no cuing.  B knee extension remains significantly limited but pt. working hard on quad strengthening/ muscle endurance.  Pt. progressing with standing/ supine LE ther.ex. program and planning to start going to pool program 1st week of June.  Pt. has ortho visit next week to discuss B knee/ TKA.    Stability/Clinical Decision Making Evolving/Moderate complexity    Clinical Decision Making Moderate    Rehab Potential Good    PT Frequency 2x / week    PT Duration 8 weeks    PT Treatment/Interventions ADLs/Self Care Home Management;Aquatic Therapy;Cryotherapy;Moist Heat;Gait training;DME Instruction;Neuromuscular re-education;Balance training;Therapeutic exercise;Therapeutic activities;Functional mobility training;Stair training;Patient/family education;Manual techniques;Passive range of motion    PT Next Visit Plan  Progress HEP    PT Home Exercise Plan Access Code: [email or web address]    Consulted and Agree with Plan of Care Patient           Patient will benefit from skilled therapeutic intervention in order to improve the following deficits and impairments:  Abnormal gait,Decreased range of motion,Difficulty walking,Impaired tone,Decreased endurance,Obesity,Pain,Decreased activity tolerance,Decreased balance,Hypomobility,Impaired flexibility,Improper body mechanics,Postural dysfunction,Decreased mobility,Decreased strength  Visit Diagnosis: Knee joint stiffness, bilateral  Muscle weakness (generalized)  Gait difficulty  Chronic pain of both knees     Problem List  Patient Active Problem List   Diagnosis Date Noted  . SOB (shortness of breath) 10/10/2017  . Coronary artery calcification 09/19/2016  . Chest pain 02/03/2016  . Elevated troponin 02/03/2016  . Anxiety 02/03/2016  . Essential hypertension 02/03/2016  . Mixed hyperlipidemia 02/03/2016  . Chest tightness 01/01/2016   Cammie Mcgee, PT, DPT # [phone] 05/30/2020, 11:42 AM  [place] [facility] [address] [place], [place], [address] Phone: [phone]   Fax:  [phone]  Name: DHAMAR GREGORY MRN: [id number] Date of Birth: 1945-03-01

## 2020-06-03 ENCOUNTER — Other Ambulatory Visit: Payer: Self-pay

## 2020-06-03 ENCOUNTER — Ambulatory Visit: Payer: Medicare Other | Admitting: Physical Therapy

## 2020-06-03 ENCOUNTER — Encounter: Payer: Self-pay | Admitting: Physical Therapy

## 2020-06-03 DIAGNOSIS — M25562 Pain in left knee: Secondary | ICD-10-CM

## 2020-06-03 DIAGNOSIS — M6281 Muscle weakness (generalized): Secondary | ICD-10-CM

## 2020-06-03 DIAGNOSIS — M25661 Stiffness of right knee, not elsewhere classified: Secondary | ICD-10-CM

## 2020-06-03 DIAGNOSIS — R269 Unspecified abnormalities of gait and mobility: Secondary | ICD-10-CM

## 2020-06-03 DIAGNOSIS — G8929 Other chronic pain: Secondary | ICD-10-CM

## 2020-06-03 NOTE — Therapy (Addendum)
[place] [facility] [address] [place], [place], [address] Phone: [phone]   Fax:  [phone]  Physical Therapy Treatment  Patient Details  Name: Joanne Lewis MRN: [id number] Date of Birth: 01-15-1945 Referring Provider (PT): Dr. Einar Crow   Encounter Date: 06/03/2020   PT End of Session - 06/03/20 0951    Visit Number 6    Number of Visits 17    Date for PT Re-Evaluation 07/09/20    Authorization - Visit Number 6    Authorization - Number of Visits 10    PT Start Time 0946    PT Stop Time 1029    PT Time Calculation (min) 43 min    Activity Tolerance Patient tolerated treatment well    Behavior During Therapy [facility] for tasks assessed/performed           Past Medical History:  Diagnosis Date  . 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HCC)   . Anginal pain (HCC)   . Arthritis   . Chronic kidney disease    moderate  . Fatigue   . 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 Hyperkalemia   . Hyperlipidemia   . Hypertension   . Pneumonia    at least 10 years ago  . Pre-diabetes     Past Surgical History:  Procedure Laterality Date  . COLONOSCOPY WITH PROPOFOL N/A 08/09/2016   Procedure: COLONOSCOPY WITH PROPOFOL;  Surgeon: Scot Jun, MD;  Location: [facility] ENDOSCOPY;  Service: Endoscopy;  Laterality: N/A;  . DILATATION & CURETTAGE/HYSTEROSCOPY WITH MYOSURE N/A 02/20/2018   Procedure: DILATATION & CURETTAGE/HYSTEROSCOPY WITH MYOSURE;  Surgeon: Schermerhorn, Ihor Austin, MD;  Location: [facility] ORS;  Service: Gynecology;  Laterality: N/A;  . DILATION AND CURETTAGE OF UTERUS N/A 02/20/2018   Procedure: DILATATION AND CURETTAGE, fractional, resection of endometrial mass, possible myosure, hysteroscopy;  Surgeon: Schermerhorn, Ihor Austin, MD;  Location: [facility] ORS;  Service: Gynecology;  Laterality: N/A;  . FEMORAL HERNIA REPAIR Right 11/26/2017   Procedure: HERNIA REPAIR FEMORAL with insertion or  mesh;  Surgeon: Ancil Linsey, MD;  Location: [facility] ORS;  Service: General;  Laterality: Right;  . HYSTEROSCOPY WITH D & C N/A 02/20/2018   Procedure: DILATATION AND CURETTAGE /HYSTEROSCOPY,;  Surgeon: Schermerhorn, Ihor Austin, MD;  Location: [facility] ORS;  Service: Gynecology;  Laterality: N/A;    There were no vitals filed for this visit.   Subjective Assessment - 06/03/20 0950    Subjective Pt notes she is having some heel pain on her L foot when ambulating.  Pt notes the pain to be minimal (0.5/10) but is still present and noticeable when walking.  Pt had to go to beach    Pertinent History Pt. states she does not use assistive device.  No h/o falls.  Pt. currently not participating with exercise but interested in starting.  Pt. sleeps in bed.  Pt notes she has a house at [facility] that she was fixing some of the areas.    Limitations Standing;Walking;House hold activities    How long can you stand comfortably? <5 min.    How long can you walk comfortably? limited due to B quad fatigue/ increase knee pain (no assistive device).    Patient Stated Goals Prepare for B TKA/ increase quad strength/ pain mgmt.              There.ex.:    Nustep L5 10 min. B UE/LE (consistent cadence/ slight increase in resistance). 0.54 miles, 101  SPM   Seated position 9 with knee extension focus.   TRX knee squats with gray chair 20x.   5# LE ex.: seated marching/ LAQ/ toe raises 20x.  Standing hip extension/ abduction/marching/ heel raises 20x each.   Forward/ backwards/ lateral walking in //-bars 5x each (5#).  Cuing for posture correction.   Supine SAQ/ quad sets with manual feedback/ bridging/ knee to chest/ bicycles 20x. Ambulation outside with focus on heel strike and proper gait mechanics.  Pt notes her shoes have been assisting with this technique and she has been focusing on improving gait pattern at home.              PT Long Term Goals - 05/24/20 1134      PT LONG TERM GOAL #1    Title Pt. independent with HEP to increase B knee extension to <-20 deg. to improve standing posture/ gait pattern.    Baseline significant knee joint AROM limitations: L (-25 to 112 deg.), R (-26 to 104 deg.)    Time 8    Period Weeks    Status New    Target Date 07/09/20      PT LONG TERM GOAL #2   Title Pt. able to stand/ walking >10 minutes with no increase c/o knee pain or rest breaks.    Baseline Pt. limited to <5 minutes of standing    Time 8    Period Weeks    Status New    Target Date 07/09/20      PT LONG TERM GOAL #3   Title Pt. able to complete aquatic exercise program with no increase c/o knee pain or limitations to prepare of TKA.    Baseline pt. currently not exercising.    Time 8    Period Weeks    Status New    Target Date 07/09/20      PT LONG TERM GOAL #4   Title Pt. will complete FOTO and score goal to improve pain-free mobility.    Baseline TBD    Time 8    Period Weeks    Status New    Target Date 07/09/20                 Plan - 06/03/20 1302    Clinical Impression Statement Pt performed well with therex today, noting that she is feeling better following the exercises given.  Pt performing well with extension exercises and is able to achieve greater extension during bicycle kicks and quad sets.  Quad sets utilized a bolster under the ankle and the pt notes she was able to get a larger contraction and stretch around the knee.  Pt will continue to benefit from skilled therapy to address remaining deficits.    Stability/Clinical Decision Making Evolving/Moderate complexity    Rehab Potential Good    PT Frequency 2x / week    PT Duration 8 weeks    PT Treatment/Interventions ADLs/Self Care Home Management;Aquatic Therapy;Cryotherapy;Moist Heat;Gait training;DME Instruction;Neuromuscular re-education;Balance training;Therapeutic exercise;Therapeutic activities;Functional mobility training;Stair training;Patient/family education;Manual techniques;Passive  range of motion    PT Next Visit Plan Progress HEP    PT Home Exercise Plan Access Code: [email or web address]    Consulted and Agree with Plan of Care Patient           Patient will benefit from skilled therapeutic intervention in order to improve the following deficits and impairments:  Abnormal gait,Decreased range of motion,Difficulty walking,Impaired tone,Decreased endurance,Obesity,Pain,Decreased activity tolerance,Decreased balance,Hypomobility,Impaired flexibility,Improper body mechanics,Postural dysfunction,Decreased mobility,Decreased strength  Visit Diagnosis:  Knee joint stiffness, bilateral  Muscle weakness (generalized)  Gait difficulty  Chronic pain of both knees     Problem List Patient Active Problem List   Diagnosis Date Noted  . SOB (shortness of breath) 10/10/2017  . Coronary artery calcification 09/19/2016  . Chest pain 02/03/2016  . Elevated troponin 02/03/2016  . Anxiety 02/03/2016  . Essential hypertension 02/03/2016  . Mixed hyperlipidemia 02/03/2016  . Chest tightness 01/01/2016    Nolon Bussing, PT, DPT 06/04/20, 9:24 AM   [place] [facility] [address] [place], [place], [address] Phone: [phone]   Fax:  [phone]  Name: Joanne Lewis MRN: [id number] Date of Birth: 05-06-1945

## 2020-06-05 ENCOUNTER — Other Ambulatory Visit: Payer: Self-pay

## 2020-06-05 ENCOUNTER — Ambulatory Visit: Payer: Medicare Other | Admitting: Physical Therapy

## 2020-06-05 ENCOUNTER — Encounter: Payer: Self-pay | Admitting: Physical Therapy

## 2020-06-05 DIAGNOSIS — M25661 Stiffness of right knee, not elsewhere classified: Secondary | ICD-10-CM | POA: Diagnosis not present

## 2020-06-05 DIAGNOSIS — G8929 Other chronic pain: Secondary | ICD-10-CM

## 2020-06-05 DIAGNOSIS — M6281 Muscle weakness (generalized): Secondary | ICD-10-CM

## 2020-06-05 DIAGNOSIS — M25662 Stiffness of left knee, not elsewhere classified: Secondary | ICD-10-CM

## 2020-06-05 DIAGNOSIS — R269 Unspecified abnormalities of gait and mobility: Secondary | ICD-10-CM

## 2020-06-05 NOTE — Therapy (Addendum)
[place] [facility] [address] [place], [place], [address] Phone: [phone]   Fax:  [phone]  Physical Therapy Treatment  Patient Details  Name: Joanne Lewis MRN: [id number] Date of Birth: Jul 12, 1945 Referring Provider (PT): Dr. Einar Crow   Encounter Date: 06/05/2020   PT End of Session - 06/05/20 0943    Visit Number 7    Number of Visits 17    Date for PT Re-Evaluation 07/09/20    Authorization - Visit Number 7    Authorization - Number of Visits 10    PT Start Time 0940    PT Stop Time 1023    PT Time Calculation (min) 43 min    Activity Tolerance Patient tolerated treatment well    Behavior During Therapy [facility] for tasks assessed/performed           Past Medical History:  Diagnosis Date  . 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HCC)   . Anginal pain (HCC)   . Arthritis   . Chronic kidney disease    moderate  . Fatigue   . 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 Hyperkalemia   . Hyperlipidemia   . Hypertension   . Pneumonia    at least 10 years ago  . Pre-diabetes     Past Surgical History:  Procedure Laterality Date  . COLONOSCOPY WITH PROPOFOL N/A 08/09/2016   Procedure: COLONOSCOPY WITH PROPOFOL;  Surgeon: Scot Jun, MD;  Location: [facility] ENDOSCOPY;  Service: Endoscopy;  Laterality: N/A;  . DILATATION & CURETTAGE/HYSTEROSCOPY WITH MYOSURE N/A 02/20/2018   Procedure: DILATATION & CURETTAGE/HYSTEROSCOPY WITH MYOSURE;  Surgeon: Schermerhorn, Ihor Austin, MD;  Location: [facility] ORS;  Service: Gynecology;  Laterality: N/A;  . DILATION AND CURETTAGE OF UTERUS N/A 02/20/2018   Procedure: DILATATION AND CURETTAGE, fractional, resection of endometrial mass, possible myosure, hysteroscopy;  Surgeon: Schermerhorn, Ihor Austin, MD;  Location: [facility] ORS;  Service: Gynecology;  Laterality: N/A;  . FEMORAL HERNIA REPAIR Right 11/26/2017   Procedure: HERNIA REPAIR FEMORAL with insertion or  mesh;  Surgeon: Ancil Linsey, MD;  Location: [facility] ORS;  Service: General;  Laterality: Right;  . HYSTEROSCOPY WITH D & C N/A 02/20/2018   Procedure: DILATATION AND CURETTAGE /HYSTEROSCOPY,;  Surgeon: Schermerhorn, Ihor Austin, MD;  Location: [facility] ORS;  Service: Gynecology;  Laterality: N/A;    There were no vitals filed for this visit.   Subjective Assessment - 06/05/20 0943    Subjective Pt notes she went to her orthopaedic appointment and notes that her appointment went well.  Pt notes she has a follow-up appointment on July 7th with Dr. Lequita Halt for pre-surgery, noting a surgical date likely in late August or early September before they can get her in.    Pertinent History Pt. states she does not use assistive device.  No h/o falls.  Pt. currently not participating with exercise but interested in starting.  Pt. sleeps in bed.  Pt notes she has a house at [facility] that she was fixing some of the areas.    Limitations Standing;Walking;House hold activities    How long can you stand comfortably? <5 min.    How long can you walk comfortably? limited due to B quad fatigue/ increase knee pain (no assistive device).    Patient Stated Goals Prepare for B TKA/ increase quad strength/ pain mgmt.    Currently in Pain? No/denies             There.ex.:  Nustep L5?7 min. B UE/LE (consistent cadence/ slight increase in resistance)- discussed weekend activity.???Seated position 9 with knee extension focus. ? SPM 102, 0.41 TRX knee squats with gray chair 30x, with focus on utilizing LE musculature instead of UE's.   5# LE ex.: seated marching/ LAQ/ toe raises 25x. ?Standing hip extension/ abduction/ marching/ heel raises 25x each.  Cuing required for posture in remaining upright, but able to self-correct quickly. Supine SAQ/ quad sets with manual feedback/ bridging/ knee to chest/ bicycles 20x.   ?         PT Long Term Goals - 05/24/20 1134      PT LONG TERM GOAL #1   Title Pt.  independent with HEP to increase B knee extension to <-20 deg. to improve standing posture/ gait pattern.    Baseline significant knee joint AROM limitations: L (-25 to 112 deg.), R (-26 to 104 deg.)    Time 8    Period Weeks    Status New    Target Date 07/09/20      PT LONG TERM GOAL #2   Title Pt. able to stand/ walking >10 minutes with no increase c/o knee pain or rest breaks.    Baseline Pt. limited to <5 minutes of standing    Time 8    Period Weeks    Status New    Target Date 07/09/20      PT LONG TERM GOAL #3   Title Pt. able to complete aquatic exercise program with no increase c/o knee pain or limitations to prepare of TKA.    Baseline pt. currently not exercising.    Time 8    Period Weeks    Status New    Target Date 07/09/20      PT LONG TERM GOAL #4   Title Pt. will complete FOTO and score goal to improve pain-free mobility.    Baseline TBD    Time 8    Period Weeks    Status New    Target Date 07/09/20                 Plan - 06/05/20 0944    Clinical Impression Statement Pt able to increase bouts of exercises and responded well to resisted ambulation in the gym.  Pt notes a good response to exercises and notes no increase in pain.  Pt continues to have noticeable genu varus of B knees, however notes that this will likely be corrected with surgical procedure.  Pt will continue to benefit from skilled therapy for addressing strength deficits prior to orthopaedic surgery for more positive outcome following procedure.    Stability/Clinical Decision Making Evolving/Moderate complexity    Rehab Potential Good    PT Frequency 2x / week    PT Duration 8 weeks    PT Treatment/Interventions ADLs/Self Care Home Management;Aquatic Therapy;Cryotherapy;Moist Heat;Gait training;DME Instruction;Neuromuscular re-education;Balance training;Therapeutic exercise;Therapeutic activities;Functional mobility training;Stair training;Patient/family education;Manual  techniques;Passive range of motion    PT Next Visit Plan Progress HEP    PT Home Exercise Plan Access Code: [email or web address]    Consulted and Agree with Plan of Care Patient           Patient will benefit from skilled therapeutic intervention in order to improve the following deficits and impairments:  Abnormal gait,Decreased range of motion,Difficulty walking,Impaired tone,Decreased endurance,Obesity,Pain,Decreased activity tolerance,Decreased balance,Hypomobility,Impaired flexibility,Improper body mechanics,Postural dysfunction,Decreased mobility,Decreased strength  Visit Diagnosis: Knee joint stiffness, bilateral  Muscle weakness (generalized)  Gait difficulty  Chronic pain of both knees  Problem List Patient Active Problem List   Diagnosis Date Noted  . SOB (shortness of breath) 10/10/2017  . Coronary artery calcification 09/19/2016  . Chest pain 02/03/2016  . Elevated troponin 02/03/2016  . Anxiety 02/03/2016  . Essential hypertension 02/03/2016  . Mixed hyperlipidemia 02/03/2016  . Chest tightness 01/01/2016    Nolon Bussing, PT, DPT 06/05/20, 5:39 PM   [place] [facility] [address] [place], [place], [address] Phone: [phone]   Fax:  [phone]  Name: Joanne Lewis MRN: [id number] Date of Birth: 1945/04/15

## 2020-06-10 ENCOUNTER — Ambulatory Visit: Payer: Medicare Other | Admitting: Physical Therapy

## 2020-06-10 ENCOUNTER — Other Ambulatory Visit: Payer: Self-pay

## 2020-06-10 ENCOUNTER — Encounter: Payer: Self-pay | Admitting: Physical Therapy

## 2020-06-10 DIAGNOSIS — M25661 Stiffness of right knee, not elsewhere classified: Secondary | ICD-10-CM

## 2020-06-10 DIAGNOSIS — G8929 Other chronic pain: Secondary | ICD-10-CM

## 2020-06-10 DIAGNOSIS — M6281 Muscle weakness (generalized): Secondary | ICD-10-CM

## 2020-06-10 DIAGNOSIS — R269 Unspecified abnormalities of gait and mobility: Secondary | ICD-10-CM

## 2020-06-10 DIAGNOSIS — M25662 Stiffness of left knee, not elsewhere classified: Secondary | ICD-10-CM

## 2020-06-10 NOTE — Therapy (Signed)
[place] [facility] [address] [place], [place], [address] Phone: [phone]   Fax:  [phone]  Physical Therapy Treatment  Patient Details  Name: Joanne Lewis MRN: [id number] Date of Birth: [birth year] Referring Provider (PT): Dr. Einar Crow   Encounter Date: 06/10/2020   PT End of Session - 06/10/20 1208    Visit Number 8    Number of Visits 17    Date for PT Re-Evaluation 07/09/20    Authorization - Visit Number 8    Authorization - Number of Visits 10    PT Start Time 0903    PT Stop Time 0949    PT Time Calculation (min) 46 min    Activity Tolerance Patient tolerated treatment well    Behavior During Therapy [facility] for tasks assessed/performed           Past Medical History:  Diagnosis Date  . 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HCC)   . Anginal pain (HCC)   . Arthritis   . Chronic kidney disease    moderate  . Fatigue   . 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 Hyperkalemia   . Hyperlipidemia   . Hypertension   . Pneumonia    at least 10 years ago  . Pre-diabetes     Past Surgical History:  Procedure Laterality Date  . COLONOSCOPY WITH PROPOFOL N/A 08/09/2016   Procedure: COLONOSCOPY WITH PROPOFOL;  Surgeon: Scot Jun, MD;  Location: [facility] ENDOSCOPY;  Service: Endoscopy;  Laterality: N/A;  . DILATATION & CURETTAGE/HYSTEROSCOPY WITH MYOSURE N/A 02/20/2018   Procedure: DILATATION & CURETTAGE/HYSTEROSCOPY WITH MYOSURE;  Surgeon: Schermerhorn, Ihor Austin, MD;  Location: [facility] ORS;  Service: Gynecology;  Laterality: N/A;  . DILATION AND CURETTAGE OF UTERUS N/A 02/20/2018   Procedure: DILATATION AND CURETTAGE, fractional, resection of endometrial mass, possible myosure, hysteroscopy;  Surgeon: Schermerhorn, Ihor Austin, MD;  Location: [facility] ORS;  Service: Gynecology;  Laterality: N/A;  . FEMORAL HERNIA REPAIR Right 11/26/2017   Procedure: HERNIA REPAIR FEMORAL with insertion or  mesh;  Surgeon: Ancil Linsey, MD;  Location: [facility] ORS;  Service: General;  Laterality: Right;  . HYSTEROSCOPY WITH D & C N/A 02/20/2018   Procedure: DILATATION AND CURETTAGE /HYSTEROSCOPY,;  Surgeon: Schermerhorn, Ihor Austin, MD;  Location: [facility] ORS;  Service: Gynecology;  Laterality: N/A;    There were no vitals filed for this visit.   Subjective Assessment - 06/10/20 1207    Subjective Pt. has f/u appt. with PCP to get clearance for L TKA.  Pt. states she is planning to start aquatic ex. soon.  Pt. reports increase L heel pain with walking/ wt. bearing tasks.    Pertinent History Pt. states she does not use assistive device.  No h/o falls.  Pt. currently not participating with exercise but interested in starting.  Pt. sleeps in bed.  Pt notes she has a house at [facility] that she was fixing some of the areas.    Limitations Standing;Walking;House hold activities    How long can you stand comfortably? <5 min.    How long can you walk comfortably? limited due to B quad fatigue/ increase knee pain (no assistive device).    Patient Stated Goals Prepare for B TKA/ increase quad strength/ pain mgmt.    Currently in Pain? Yes    Pain Location Heel    Pain Orientation Left               There.ex.:  Nustep L5-4?10 min. B UE/LE (consistent cadence/ slight increase in resistance)- discussed weekend activity.? SPM 102, 0.56  Walking LE ex. (5#): Standing hip extension/ abduction/ marching/ heel raises 30x each. Seated marching/ LAQ/ toe raises 30x. ?  Seated L gastroc stretches/ nerve glides 3x with holds.    TRX knee squats with gray chair 20x, with focus on utilizing LE musculature instead of UE's.    Supine quad sets/ reviewed HEP and discussed technique/ foot position with quad activation.  Supine hip/ knee stretches  Walking in clinic/ hallway with assessment of L heel mobility/ strike.       PT Long Term Goals - 05/24/20 1134      PT LONG TERM GOAL #1   Title Pt.  independent with HEP to increase B knee extension to <-20 deg. to improve standing posture/ gait pattern.    Baseline significant knee joint AROM limitations: L (-25 to 112 deg.), R (-26 to 104 deg.)    Time 8    Period Weeks    Status New    Target Date 07/09/20      PT LONG TERM GOAL #2   Title Pt. able to stand/ walking >10 minutes with no increase c/o knee pain or rest breaks.    Baseline Pt. limited to <5 minutes of standing    Time 8    Period Weeks    Status New    Target Date 07/09/20      PT LONG TERM GOAL #3   Title Pt. able to complete aquatic exercise program with no increase c/o knee pain or limitations to prepare of TKA.    Baseline pt. currently not exercising.    Time 8    Period Weeks    Status New    Target Date 07/09/20      PT LONG TERM GOAL #4   Title Pt. will complete FOTO and score goal to improve pain-free mobility.    Baseline TBD    Time 8    Period Weeks    Status New    Target Date 07/09/20               Plan - 06/10/20 1211    Clinical Impression Statement Pt. works hard during tx. session and showing improvement in overall endurance during standing ther.ex./ Nustep.  Few seated rest breaks taken during tx. session.  L heel pain reported during LE resisted ther.ex./ walking in clinic.  PT discussed possible causes of L heel pain and reviewed HEP technique/ seated manual gastroc stretches.  PT discussed the different aquatic ex. classes and pt. is planning to sign up this week.    Stability/Clinical Decision Making Evolving/Moderate complexity    Clinical Decision Making Moderate    Rehab Potential Good    PT Frequency 2x / week    PT Duration 8 weeks    PT Treatment/Interventions ADLs/Self Care Home Management;Aquatic Therapy;Cryotherapy;Moist Heat;Gait training;DME Instruction;Neuromuscular re-education;Balance training;Therapeutic exercise;Therapeutic activities;Functional mobility training;Stair training;Patient/family education;Manual  techniques;Passive range of motion    PT Next Visit Plan Progress HEP    PT Home Exercise Plan Access Code: [email or web address]    Consulted and Agree with Plan of Care Patient           Patient will benefit from skilled therapeutic intervention in order to improve the following deficits and impairments:  Abnormal gait,Decreased range of motion,Difficulty walking,Impaired tone,Decreased endurance,Obesity,Pain,Decreased activity tolerance,Decreased balance,Hypomobility,Impaired flexibility,Improper body mechanics,Postural dysfunction,Decreased mobility,Decreased strength  Visit Diagnosis: Knee joint stiffness, bilateral  Muscle weakness (generalized)  Gait difficulty  Chronic pain of both knees     Problem List Patient Active Problem List   Diagnosis Date Noted  . SOB (shortness of breath) 10/10/2017  . Coronary artery calcification 09/19/2016  . Chest pain 02/03/2016  . Elevated troponin 02/03/2016  . Anxiety 02/03/2016  . Essential hypertension 02/03/2016  . Mixed hyperlipidemia 02/03/2016  . Chest tightness 01/01/2016   Cammie Mcgee, PT, DPT # [phone] 06/10/2020, 12:18 PM  [place] [facility] [address] [place], [place], [address] Phone: [phone]   Fax:  [phone]  Name: Joanne Lewis MRN: [id number] Date of Birth: 1945/07/18

## 2020-06-12 ENCOUNTER — Encounter: Payer: Self-pay | Admitting: Physical Therapy

## 2020-06-12 ENCOUNTER — Other Ambulatory Visit: Payer: Self-pay

## 2020-06-12 ENCOUNTER — Ambulatory Visit: Payer: Medicare Other | Attending: Internal Medicine | Admitting: Physical Therapy

## 2020-06-12 DIAGNOSIS — R269 Unspecified abnormalities of gait and mobility: Secondary | ICD-10-CM | POA: Diagnosis present

## 2020-06-12 DIAGNOSIS — M25562 Pain in left knee: Secondary | ICD-10-CM | POA: Insufficient documentation

## 2020-06-12 DIAGNOSIS — M25662 Stiffness of left knee, not elsewhere classified: Secondary | ICD-10-CM | POA: Insufficient documentation

## 2020-06-12 DIAGNOSIS — M79672 Pain in left foot: Secondary | ICD-10-CM | POA: Diagnosis present

## 2020-06-12 DIAGNOSIS — M6281 Muscle weakness (generalized): Secondary | ICD-10-CM | POA: Insufficient documentation

## 2020-06-12 DIAGNOSIS — M25661 Stiffness of right knee, not elsewhere classified: Secondary | ICD-10-CM

## 2020-06-12 DIAGNOSIS — M25561 Pain in right knee: Secondary | ICD-10-CM | POA: Diagnosis present

## 2020-06-12 DIAGNOSIS — G8929 Other chronic pain: Secondary | ICD-10-CM

## 2020-06-13 NOTE — Therapy (Signed)
[place] [facility] [address]. [place], [place], [address] Phone: [phone]   Fax:  [phone]  Physical Therapy Treatment  Patient Details  Name: Joanne Lewis MRN: [id number] Date of Birth: 1945-08-06 Referring Provider (PT): Dr. Einar Crow   Encounter Date: 06/12/2020   PT End of Session - 06/12/20 1112    Visit Number 9    Number of Visits 17    Date for PT Re-Evaluation 07/09/20    Authorization - Visit Number 9    Authorization - Number of Visits 10    PT Start Time 0943    PT Stop Time 1035    PT Time Calculation (min) 52 min    Activity Tolerance Patient tolerated treatment well    Behavior During Therapy [facility] for tasks assessed/performed           Past Medical History:  Diagnosis Date  . 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HCC)   . Anginal pain (HCC)   . Arthritis   . Chronic kidney disease    moderate  . Fatigue   . 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 Hyperkalemia   . Hyperlipidemia   . Hypertension   . Pneumonia    at least 10 years ago  . Pre-diabetes     Past Surgical History:  Procedure Laterality Date  . COLONOSCOPY WITH PROPOFOL N/A 08/09/2016   Procedure: COLONOSCOPY WITH PROPOFOL;  Surgeon: Scot Jun, MD;  Location: [facility] ENDOSCOPY;  Service: Endoscopy;  Laterality: N/A;  . DILATATION & CURETTAGE/HYSTEROSCOPY WITH MYOSURE N/A 02/20/2018   Procedure: DILATATION & CURETTAGE/HYSTEROSCOPY WITH MYOSURE;  Surgeon: Schermerhorn, Ihor Austin, MD;  Location: [facility] ORS;  Service: Gynecology;  Laterality: N/A;  . DILATION AND CURETTAGE OF UTERUS N/A 02/20/2018   Procedure: DILATATION AND CURETTAGE, fractional, resection of endometrial mass, possible myosure, hysteroscopy;  Surgeon: Schermerhorn, Ihor Austin, MD;  Location: [facility] ORS;  Service: Gynecology;  Laterality: N/A;  . FEMORAL HERNIA REPAIR Right 11/26/2017   Procedure: HERNIA REPAIR FEMORAL with insertion or mesh;   Surgeon: Ancil Linsey, MD;  Location: [facility] ORS;  Service: General;  Laterality: Right;  . HYSTEROSCOPY WITH D & C N/A 02/20/2018   Procedure: DILATATION AND CURETTAGE /HYSTEROSCOPY,;  Surgeon: Schermerhorn, Ihor Austin, MD;  Location: [facility] ORS;  Service: Gynecology;  Laterality: N/A;    There were no vitals filed for this visit.   Subjective Assessment - 06/12/20 1102    Subjective Pt. planning to start aquatic ex. program this week and PT modified schedule to Monday/Wednesday so pt. can attend pool Tuesday/Thursday.  Pt. continues to remain active and compliant with walking/ HEP on a daily basis.  Pt. continues to have L heel discomfort with walking.    Pertinent History Pt. states she does not use assistive device.  No h/o falls.  Pt. currently not participating with exercise but interested in starting.  Pt. sleeps in bed.  Pt notes she has a house at [facility] that she was fixing some of the areas.    Limitations Standing;Walking;House hold activities    How long can you stand comfortably? <5 min.    How long can you walk comfortably? limited due to B quad fatigue/ increase knee pain (no assistive device).    Patient Stated Goals Prepare for B TKA/ increase quad strength/ pain mgmt.    Currently in Pain? No/denies            There.ex.:   Nustep L510 min.  B UE/LE (consistent cadence/ slight increase in resistance). 0.6 miles, 101 SPMSeated position 9 with knee extension focus.   TRX knee squats with gray chair 25x.    5# LE ex.: seated marching/ LAQ/ toe raises 20x. Standing hip extension/ abduction/marching/ heel raises 20x each.    Forward/ backwards/ lateral walking in //-bars 5x each (5#).  Cuing for posture correction.    Supine SAQ/ quad sets with manual feedback/ bridging/ knee to chest 20x.  Ambulation in clinic/ hallway with focus on heel strike and proper gait mechanics.    Supine B LE stretching (generalzied)- focus on distal hamstring/ patellar mobility.       PT Long Term Goals - 05/24/20 1134      PT LONG TERM GOAL #1   Title Pt. independent with HEP to increase B knee extension to <-20 deg. to improve standing posture/ gait pattern.    Baseline significant knee joint AROM limitations: L (-25 to 112 deg.), R (-26 to 104 deg.)    Time 8    Period Weeks    Status New    Target Date 07/09/20      PT LONG TERM GOAL #2   Title Pt. able to stand/ walking >10 minutes with no increase c/o knee pain or rest breaks.    Baseline Pt. limited to <5 minutes of standing    Time 8    Period Weeks    Status New    Target Date 07/09/20      PT LONG TERM GOAL #3   Title Pt. able to complete aquatic exercise program with no increase c/o knee pain or limitations to prepare of TKA.    Baseline pt. currently not exercising.    Time 8    Period Weeks    Status New    Target Date 07/09/20      PT LONG TERM GOAL #4   Title Pt. will complete FOTO and score goal to improve pain-free mobility.    Baseline TBD    Time 8    Period Weeks    Status New    Target Date 07/09/20                 Plan - 06/12/20 1112    Clinical Impression Statement B knee AROM remains limited with extension in standing/walking.  Pt. will start aquatic ex. program on the days she doesn't participate with PT.  Pt. works hard during tx. and progressing with increase resistance/ reps. during therex.  Significant B knee hypomobility remains with all manual tx.  Good technique with standing gastroc stretches at 1st step with holds.  PT added resisted clamshell ex. to HEP and pt. demonstrates good understanding.    Stability/Clinical Decision Making Evolving/Moderate complexity    Clinical Decision Making Moderate    Rehab Potential Good    PT Frequency 2x / week    PT Duration 8 weeks    PT Treatment/Interventions ADLs/Self Care Home Management;Aquatic Therapy;Cryotherapy;Moist Heat;Gait training;DME Instruction;Neuromuscular re-education;Balance training;Therapeutic  exercise;Therapeutic activities;Functional mobility training;Stair training;Patient/family education;Manual techniques;Passive range of motion    PT Next Visit Plan Discuss aquatic ex./ pain after ex.    PT Home Exercise Plan Access Code: [email or web address].  Added GTB clamshell ex. in seated or supine.    Consulted and Agree with Plan of Care Patient           Patient will benefit from skilled therapeutic intervention in order to improve the following deficits and impairments:  Abnormal gait,Decreased range of motion,Difficulty walking,Impaired  tone,Decreased endurance,Obesity,Pain,Decreased activity tolerance,Decreased balance,Hypomobility,Impaired flexibility,Improper body mechanics,Postural dysfunction,Decreased mobility,Decreased strength  Visit Diagnosis: Knee joint stiffness, bilateral  Muscle weakness (generalized)  Gait difficulty  Chronic pain of both knees     Problem List Patient Active Problem List   Diagnosis Date Noted  . SOB (shortness of breath) 10/10/2017  . Coronary artery calcification 09/19/2016  . Chest pain 02/03/2016  . Elevated troponin 02/03/2016  . Anxiety 02/03/2016  . Essential hypertension 02/03/2016  . Mixed hyperlipidemia 02/03/2016  . Chest tightness 01/01/2016   Cammie Mcgee, PT, DPT # [phone] 06/13/2020, 9:27 AM  [place] [facility] [address] [place], [place], [address] Phone: [phone]   Fax:  [phone]  Name: Joanne Lewis MRN: [id number] Date of Birth: 11-22-1945

## 2020-06-16 ENCOUNTER — Other Ambulatory Visit: Payer: Self-pay

## 2020-06-16 ENCOUNTER — Ambulatory Visit: Payer: Medicare Other | Admitting: Physical Therapy

## 2020-06-16 ENCOUNTER — Encounter: Payer: Self-pay | Admitting: Physical Therapy

## 2020-06-16 DIAGNOSIS — M25662 Stiffness of left knee, not elsewhere classified: Secondary | ICD-10-CM

## 2020-06-16 DIAGNOSIS — M25661 Stiffness of right knee, not elsewhere classified: Secondary | ICD-10-CM | POA: Diagnosis not present

## 2020-06-16 DIAGNOSIS — G8929 Other chronic pain: Secondary | ICD-10-CM

## 2020-06-16 DIAGNOSIS — R269 Unspecified abnormalities of gait and mobility: Secondary | ICD-10-CM

## 2020-06-16 DIAGNOSIS — M25562 Pain in left knee: Secondary | ICD-10-CM

## 2020-06-16 DIAGNOSIS — M25561 Pain in right knee: Secondary | ICD-10-CM

## 2020-06-16 DIAGNOSIS — M6281 Muscle weakness (generalized): Secondary | ICD-10-CM

## 2020-06-16 NOTE — Therapy (Signed)
[facility] [address] [place], [place], [address] Phone: [phone]   Fax:  [phone]  Physical Therapy Treatment Physical Therapy Progress Note   Dates of reporting period  05/14/2020  to  06/16/2020  Patient Details  Name: Joanne Lewis MRN: [id number] Date of Birth: 02/22/1945 Referring Provider (PT): Dr. Frazier Richards   Encounter Date: 06/16/2020   PT End of Session - 06/16/20 0815    Visit Number 10    Number of Visits 17    Date for PT Re-Evaluation 07/09/20    Authorization - Visit Number 10    Authorization - Number of Visits 10    PT Start Time 0812    PT Stop Time 0906    PT Time Calculation (min) 54 min    Activity Tolerance Patient tolerated treatment well    Behavior During Therapy [facility] for tasks assessed/performed           Past Medical History:  Diagnosis Date  . 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 Anginal pain ([place])   . Arthritis   . Chronic kidney disease    moderate  . Fatigue   . 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 Hyperkalemia   . Hyperlipidemia   . Hypertension   . Pneumonia    at least 10 years ago  . Pre-diabetes     Past Surgical History:  Procedure Laterality Date  . COLONOSCOPY WITH PROPOFOL N/A 08/09/2016   Procedure: COLONOSCOPY WITH PROPOFOL;  Surgeon: Manya Silvas, MD;  Location: [facility] ENDOSCOPY;  Service: Endoscopy;  Laterality: N/A;  . DILATATION & CURETTAGE/HYSTEROSCOPY WITH MYOSURE N/A 02/20/2018   Procedure: DILATATION & CURETTAGE/HYSTEROSCOPY WITH MYOSURE;  Surgeon: Schermerhorn, Gwen Her, MD;  Location: [facility] ORS;  Service: Gynecology;  Laterality: N/A;  . DILATION AND CURETTAGE OF UTERUS N/A 02/20/2018   Procedure: DILATATION AND CURETTAGE, fractional, resection of endometrial mass, possible myosure, hysteroscopy;  Surgeon: Schermerhorn, Gwen Her, MD;  Location: [facility] ORS;  Service: Gynecology;  Laterality: N/A;  . FEMORAL  HERNIA REPAIR Right 11/26/2017   Procedure: HERNIA REPAIR FEMORAL with insertion or mesh;  Surgeon: Vickie Epley, MD;  Location: [facility] ORS;  Service: General;  Laterality: Right;  . HYSTEROSCOPY WITH D & C N/A 02/20/2018   Procedure: DILATATION AND CURETTAGE /HYSTEROSCOPY,;  Surgeon: Schermerhorn, Gwen Her, MD;  Location: [facility] ORS;  Service: Gynecology;  Laterality: N/A;    There were no vitals filed for this visit.   Subjective Assessment - 06/16/20 0812    Subjective Pt. reports B ankle/heel pain (4/10)- L heel is worse than R.  Pt. signed up for pool program last week and is hoping to attend first class tomorrow.    Pertinent History Pt. states she does not use assistive device.  No h/o falls.  Pt. currently not participating with exercise but interested in starting.  Pt. sleeps in bed.  Pt notes she has a house at [facility] that she was fixing some of the areas.    Limitations Standing;Walking;House hold activities    How long can you stand comfortably? <5 min.    How long can you walk comfortably? limited due to B quad fatigue/ increase knee pain (no assistive device).    Patient Stated Goals Prepare for B TKA/ increase quad strength/ pain mgmt.    Currently in Pain? Yes    Pain Score 4     Pain Location Heel    Pain Orientation  Right;Left            There.ex.:   Nustep L510 min. B UE/LE (consistent cadence/ slight increase in resistance).0.6 miles, 101 SPMSeated position 8-9 with knee extension focus.   Standing gastroc/hamstring/knee flexion stretches at stairs 5x with static holds (no bouncing).    TG knee flexion 20x (midline knee position). Heel raises 20x.     Supine B LE stretching (generalzied)- focus on distal hamstring/ patellar mobility.   Supine ball ex.: knee to chest/   5# LE ex.: seated marching/ LAQ/ toe raises 20x. Standing hip extension/ abduction/marching/ heel raises 20x each.   Forward/ backwards/ lateral walking in //-bars 5x each  (5#). Cuing for posture correction.   Ambulation in clinic/ hallway with focus on heel strike and proper gait mechanics.   .      PT Long Term Goals - 06/16/20 1256      PT LONG TERM GOAL #1   Title Pt. independent with HEP to increase B knee extension to <-20 deg. to improve standing posture/ gait pattern.    Baseline significant knee joint AROM limitations: L (-25 to 112 deg.), R (-26 to 104 deg.)    Time 8    Period Weeks    Status On-going    Target Date 07/09/20      PT LONG TERM GOAL #2   Title Pt. able to stand/ walking >10 minutes with no increase c/o knee pain or rest breaks.    Baseline Pt. limited to <5 minutes of standing.  6/6: Pt. reports improvement with standing at church with less wt. shifting    Time 8    Period Weeks    Status Partially Met    Target Date 07/09/20      PT LONG TERM GOAL #3   Title Pt. able to complete aquatic exercise program with no increase c/o knee pain or limitations to prepare of TKA.    Baseline pt. currently not exercising.    Time 8    Period Weeks    Status On-going    Target Date 07/09/20      PT LONG TERM GOAL #4   Title Pt. will complete FOTO and score goal to improve pain-free mobility.    Baseline TBD    Time 8    Period Weeks    Status Unable to assess    Target Date 07/09/20                 Plan - 06/16/20 1250    Clinical Impression Statement Pt. doing well with resisted therex. in supine/standing posture.  B heel pain with increase walking in hallway and focus on more consistent heel strike/ toe off.  Limited B patella mobility (all planes) and distal hamstring tightness.  Pt. will start aquatic ex. tomorrow and instructed to reach out to PT if any concerns/ questions.    Stability/Clinical Decision Making Evolving/Moderate complexity    Clinical Decision Making Moderate    Rehab Potential Good    PT Frequency 2x / week    PT Duration 8 weeks    PT Treatment/Interventions ADLs/Self Care Home  Management;Aquatic Therapy;Cryotherapy;Moist Heat;Gait training;DME Instruction;Neuromuscular re-education;Balance training;Therapeutic exercise;Therapeutic activities;Functional mobility training;Stair training;Patient/family education;Manual techniques;Passive range of motion    PT Next Visit Plan Discuss aquatic ex./ pain after ex.    PT Home Exercise Plan Access Code: [email or web address].  Added GTB clamshell ex. in seated or supine.    Consulted and Agree with Plan of Care Patient  Patient will benefit from skilled therapeutic intervention in order to improve the following deficits and impairments:  Abnormal gait,Decreased range of motion,Difficulty walking,Impaired tone,Decreased endurance,Obesity,Pain,Decreased activity tolerance,Decreased balance,Hypomobility,Impaired flexibility,Improper body mechanics,Postural dysfunction,Decreased mobility,Decreased strength  Visit Diagnosis: Knee joint stiffness, bilateral  Muscle weakness (generalized)  Gait difficulty  Chronic pain of both knees     Problem List Patient Active Problem List   Diagnosis Date Noted  . SOB (shortness of breath) 10/10/2017  . Coronary artery calcification 09/19/2016  . Chest pain 02/03/2016  . Elevated troponin 02/03/2016  . Anxiety 02/03/2016  . Essential hypertension 02/03/2016  . Mixed hyperlipidemia 02/03/2016  . Chest tightness 01/01/2016   Pura Spice, PT, DPT # [phone] 06/16/2020, 1:01 PM  [facility] [address] [place], [place], [address] Phone: [phone]   Fax:  [phone]  Name: CARLIN MAMONE MRN: [id number] Date of Birth: Feb 21, 1945

## 2020-06-17 ENCOUNTER — Encounter: Payer: Medicare Other | Admitting: Physical Therapy

## 2020-06-18 ENCOUNTER — Encounter: Payer: Self-pay | Admitting: Physical Therapy

## 2020-06-18 ENCOUNTER — Other Ambulatory Visit: Payer: Self-pay

## 2020-06-18 ENCOUNTER — Ambulatory Visit: Payer: Medicare Other | Admitting: Physical Therapy

## 2020-06-18 DIAGNOSIS — M25662 Stiffness of left knee, not elsewhere classified: Secondary | ICD-10-CM

## 2020-06-18 DIAGNOSIS — M6281 Muscle weakness (generalized): Secondary | ICD-10-CM

## 2020-06-18 DIAGNOSIS — R269 Unspecified abnormalities of gait and mobility: Secondary | ICD-10-CM

## 2020-06-18 DIAGNOSIS — M25661 Stiffness of right knee, not elsewhere classified: Secondary | ICD-10-CM | POA: Diagnosis not present

## 2020-06-18 DIAGNOSIS — M25562 Pain in left knee: Secondary | ICD-10-CM

## 2020-06-18 DIAGNOSIS — G8929 Other chronic pain: Secondary | ICD-10-CM

## 2020-06-18 NOTE — Therapy (Signed)
[facility] [address] [place], [place], [address] Phone: [phone]   Fax:  [phone]  Physical Therapy Treatment  Patient Details  Name: Joanne Lewis MRN: [id number] Date of Birth: [birth year] Referring Provider (PT): Dr. 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[place])    Anginal pain ([place])    Arthritis    Chronic kidney disease    moderate   Fatigue    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 11/25/2017   Hyperkalemia    Hyperlipidemia    Hypertension    Pneumonia    at least 10 years ago   Pre-diabetes     Past Surgical History:  Procedure Laterality Date   COLONOSCOPY WITH PROPOFOL N/A 08/09/2016   Procedure: COLONOSCOPY WITH PROPOFOL;  Surgeon: Manya Silvas, MD;  Location: [facility] ENDOSCOPY;  Service: Endoscopy;  Laterality: N/A;   DILATATION & CURETTAGE/HYSTEROSCOPY WITH MYOSURE N/A 02/20/2018   Procedure: DILATATION & CURETTAGE/HYSTEROSCOPY WITH MYOSURE;  Surgeon: Schermerhorn, Gwen Her, MD;  Location: [facility] ORS;  Service: Gynecology;  Laterality: N/A;   DILATION AND CURETTAGE OF UTERUS N/A 02/20/2018   Procedure: DILATATION AND CURETTAGE, fractional, resection of endometrial mass, possible myosure, hysteroscopy;  Surgeon: Schermerhorn, Gwen Her, MD;  Location: [facility] ORS;  Service: Gynecology;  Laterality: N/A;   FEMORAL HERNIA REPAIR Right 11/26/2017   Procedure: HERNIA REPAIR FEMORAL with insertion or mesh;  Surgeon: Vickie Epley, MD;  Location: [facility] ORS;  Service:  General;  Laterality: Right;   HYSTEROSCOPY WITH D & C N/A 02/20/2018   Procedure: DILATATION AND CURETTAGE /HYSTEROSCOPY,;  Surgeon: Schermerhorn, Gwen Her, MD;  Location: [facility] ORS;  Service: Gynecology;  Laterality: N/A;    There were no vitals filed for this visit.   Subjective Assessment - 06/18/20 0758     Subjective Pt. reported an excellent experience with the pool-based exercise class. She stated that the water exercises felt great and fun with manageable soreness the next day that was not outside of the normal. Pt. reports R ankle weakness and pain (2-3/10) within 24 after the TG exercises during WB.    Pertinent History Pt. states she does not use assistive device.  No h/o falls.  Pt. currently not participating with exercise but interested in starting.  Pt. sleeps in bed.  Pt notes she has a house at [facility] that she was fixing some of the areas.    Limitations Standing;Walking;House hold activities    How long can you stand comfortably? <5 min.    How long can you walk comfortably? limited due to B quad fatigue/ increase knee pain (no assistive device).    Patient Stated Goals Prepare for B TKA/ increase quad strength/ pain mgmt.    Currently in Pain? Yes    Pain Score 3     Pain Location Heel    Pain Orientation Right  There.ex.:    Nustep L5 10 min. B UE/LE (consistent cadence/ slight increase in resistance). 0.6 miles, 124 SPM   Seated position 9 with knee extension focus.    Ambulation in clinic/ hallway with focus on heel strike, equal arm swing, and proper gait mechanics.  Supine B LE stretching (generalzied)- focus on distal hamstring/ patellar mobility  Supine ball ex.: knee to chest  With 5 sec quad set at terminal knee ext x20/ bridging with ball 20x.  LE function power: Stair step up/down 20x each side, verbal cueing to decrease UE assist. Reciprocal     5# LE ex.: seated marching/ LAQ/ toe raises 20x.  Standing hip extension/  abduction/marching/ heel raises 20x each.    TG knee flexion 20x (midline knee position). Toe-out squat to target VMO x20.   Toe-in squat x20      Forward/ backwards/ lateral walking in //-bars 5x each (5#).  Cuing for posture correction.               PT Long Term Goals - 06/16/20 1256       PT LONG TERM GOAL #1   Title Pt. independent with HEP to increase B knee extension to <-20 deg. to improve standing posture/ gait pattern.    Baseline significant knee joint AROM limitations: L (-25 to 112 deg.), R (-26 to 104 deg.)    Time 8    Period Weeks    Status On-going    Target Date 07/09/20      PT LONG TERM GOAL #2   Title Pt. able to stand/ walking >10 minutes with no increase c/o knee pain or rest breaks.    Baseline Pt. limited to <5 minutes of standing.  6/6: Pt. reports improvement with standing at church with less wt. shifting    Time 8    Period Weeks    Status Partially Met    Target Date 07/09/20      PT LONG TERM GOAL #3   Title Pt. able to complete aquatic exercise program with no increase c/o knee pain or limitations to prepare of TKA.    Baseline pt. currently not exercising.    Time 8    Period Weeks    Status On-going    Target Date 07/09/20      PT LONG TERM GOAL #4   Title Pt. will complete FOTO and score goal to improve pain-free mobility.    Baseline TBD    Time 8    Period Weeks    Status Unable to assess    Target Date 07/09/20             Pt displayed good tolerance during treatment today 2/2 due min soreness prior to treatment and good moral 2/2 start of pool- exercise class. Pt continues to display a crouched gait during amb 2/2 poor terminal knee ext. Pt reports no discomfort with TG exercise when heel raising was removed. Pt continues to display gait deficits 2/2 to bilat knee strength, ROM, and power deficits. Pt will continue to benefit from skilled PT to progress POC for increase functional capacity for greater TKA  outcomes.        Patient will benefit from skilled therapeutic intervention in order to improve the following deficits and impairments:  Abnormal gait, Decreased range of motion, Difficulty walking, Impaired tone, Decreased endurance, Obesity, Pain, Decreased activity tolerance, Decreased balance, Hypomobility, Impaired flexibility, Improper body mechanics, Postural dysfunction, Decreased mobility, Decreased strength  Visit Diagnosis: Knee joint stiffness,   bilateral  Muscle weakness (generalized)  Gait difficulty  Chronic pain of both knees     Problem List Patient Active Problem List   Diagnosis Date Noted   SOB (shortness of breath) 10/10/2017   Coronary artery calcification 09/19/2016   Chest pain 02/03/2016   Elevated troponin 02/03/2016   Anxiety 02/03/2016   Essential hypertension 02/03/2016   Mixed hyperlipidemia 02/03/2016   Chest tightness 01/01/2016   Pura Spice, PT, DPT # [phone] 06/22/2020, 5:41 PM  [facility] [address] [place], [place], [address] Phone: [phone]   Fax:  [phone]  Name: Joanne Lewis MRN: [id number] Date of Birth: 09-29-1945

## 2020-06-19 ENCOUNTER — Encounter: Payer: Medicare Other | Admitting: Physical Therapy

## 2020-06-24 ENCOUNTER — Encounter: Payer: Medicare Other | Admitting: Physical Therapy

## 2020-06-25 ENCOUNTER — Ambulatory Visit: Payer: Medicare Other | Admitting: Physical Therapy

## 2020-06-25 ENCOUNTER — Encounter: Payer: Self-pay | Admitting: Physical Therapy

## 2020-06-25 ENCOUNTER — Other Ambulatory Visit: Payer: Self-pay

## 2020-06-25 DIAGNOSIS — M25661 Stiffness of right knee, not elsewhere classified: Secondary | ICD-10-CM | POA: Diagnosis not present

## 2020-06-25 DIAGNOSIS — M25562 Pain in left knee: Secondary | ICD-10-CM

## 2020-06-25 DIAGNOSIS — M79672 Pain in left foot: Secondary | ICD-10-CM

## 2020-06-25 DIAGNOSIS — G8929 Other chronic pain: Secondary | ICD-10-CM

## 2020-06-25 DIAGNOSIS — R269 Unspecified abnormalities of gait and mobility: Secondary | ICD-10-CM

## 2020-06-25 DIAGNOSIS — M6281 Muscle weakness (generalized): Secondary | ICD-10-CM

## 2020-06-25 NOTE — Patient Instructions (Signed)
Access Code: [email or web address] URL: [email or web address] Date: 06/25/2020 Prepared by: Dorene Grebe  Exercises Seated Hip Abduction with Resistance - 1 x daily - 5 x weekly - 2 sets - 10 reps Standing Hip Abduction - 1 x daily - 7 x weekly - 3 sets - 10 reps Mini Squat with Counter Support - 1 x daily - 7 x weekly - 3 sets - 10 reps [employer] on Counter - 1 x daily - 5 x weekly - 2 sets - 10 reps Seated Hamstring Stretch - 1 x daily - 5 x weekly - 3 sets - 30 hold Standing Calf Stretch - 1 x daily - 7 x weekly - 3 sets - 10 reps Standing Soleus Stretch - 1 x daily - 7 x weekly - 3 sets - 10 reps Heel rises with counter support - 1 x daily - 5 x weekly - 5 sets - 45s hold

## 2020-06-25 NOTE — Therapy (Signed)
[facility] [address] [place], [place], [address] Phone: [phone]   Fax:  [phone]  Physical Therapy Treatment  Patient Details  Name: Joanne Lewis MRN: [id number] Date of Birth: 1945-01-15 Referring Provider (PT): Dr. 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[place])    Anginal pain ([place])    Arthritis    Chronic kidney disease    moderate   Fatigue    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 11/25/2017   Hyperkalemia    Hyperlipidemia    Hypertension    Pneumonia    at least 10 years ago   Pre-diabetes     Past Surgical History:  Procedure Laterality Date   COLONOSCOPY WITH PROPOFOL N/A 08/09/2016   Procedure: COLONOSCOPY WITH PROPOFOL;  Surgeon: Manya Silvas, MD;  Location: [facility] ENDOSCOPY;  Service: Endoscopy;  Laterality: N/A;   DILATATION & CURETTAGE/HYSTEROSCOPY WITH MYOSURE N/A 02/20/2018   Procedure: DILATATION & CURETTAGE/HYSTEROSCOPY WITH MYOSURE;  Surgeon: Schermerhorn, Gwen Her, MD;  Location: [facility] ORS;  Service: Gynecology;  Laterality: N/A;   DILATION AND CURETTAGE OF UTERUS N/A 02/20/2018   Procedure: DILATATION AND CURETTAGE, fractional, resection of endometrial mass, possible myosure, hysteroscopy;  Surgeon: Schermerhorn, Gwen Her, MD;  Location: [facility] ORS;  Service: Gynecology;  Laterality: N/A;   FEMORAL HERNIA REPAIR Right 11/26/2017   Procedure: HERNIA REPAIR FEMORAL with insertion or mesh;   Surgeon: Vickie Epley, MD;  Location: [facility] ORS;  Service: General;  Laterality: Right;   HYSTEROSCOPY WITH D & C N/A 02/20/2018   Procedure: DILATATION AND CURETTAGE /HYSTEROSCOPY,;  Surgeon: Schermerhorn, Gwen Her, MD;  Location: [facility] ORS;  Service: Gynecology;  Laterality: N/A;    There were no vitals filed for this visit.   Subjective Assessment - 06/25/20 0814     Subjective Pt loved the pool exercises. Standing on solid floor hurts the left heel especially in the morning. Pt states a change of medication for CKD III, will bring it next visit.    Pertinent History Pt. states she does not use assistive device.  No h/o falls.  Pt. currently not participating with exercise but interested in starting.  Pt. sleeps in bed.  Pt notes she has a house at [facility] that she was fixing some of the areas.    Limitations Standing;Walking;House hold activities    How long can you stand comfortably? <5 min.    How long can you walk comfortably? limited due to B quad fatigue/ increase knee pain (no assistive device).    Patient Stated Goals Prepare for B TKA/ increase quad strength/ pain mgmt.    Currently in Pain? Yes    Pain Score 3     Pain Location Heel    Pain Orientation Right  Treatment:   //bar:   Standing heel raise isometric 45s hold x4. Short sitting breaks needed d/t pain in bilateral knees  Hip abduction 3# AW, 2x10 each.    Stairs:    Gastroc stretch and soleus stretch 2x30s hold each  Seated:  LAQ 3# 2x10 each.     Hip abduction YB at knee, 2x10    Hamstring stretch 2x30s each.   HEP Updated: Access Code: [email or web address]  Assessment of L heel/ plantar aspect of foot.         PT Education - 06/25/20 0855     Education Details Access Code: [email or web address]    Person(s) Educated Patient    Methods Explanation;Demonstration;Tactile cues    Comprehension Verbalized understanding;Returned demonstration;Verbal cues required                 PT Long  Term Goals - 06/16/20 1256       PT LONG TERM GOAL #1   Title Pt. independent with HEP to increase B knee extension to <-20 deg. to improve standing posture/ gait pattern.    Baseline significant knee joint AROM limitations: L (-25 to 112 deg.), R (-26 to 104 deg.)    Time 8    Period Weeks    Status On-going    Target Date 07/09/20      PT LONG TERM GOAL #2   Title Pt. able to stand/ walking >10 minutes with no increase c/o knee pain or rest breaks.    Baseline Pt. limited to <5 minutes of standing.  6/6: Pt. reports improvement with standing at church with less wt. shifting    Time 8    Period Weeks    Status Partially Met    Target Date 07/09/20      PT LONG TERM GOAL #3   Title Pt. able to complete aquatic exercise program with no increase c/o knee pain or limitations to prepare of TKA.    Baseline pt. currently not exercising.    Time 8    Period Weeks    Status On-going    Target Date 07/09/20      PT LONG TERM GOAL #4   Title Pt. will complete FOTO and score goal to improve pain-free mobility.    Baseline TBD    Time 8    Period Weeks    Status Unable to assess    Target Date 07/09/20                 Plan - 06/25/20 0924     Clinical Impression Statement L heel/achilles tendon palpated, pain and symptoms are concordant with tendinitis. Isometric heel raise and calf stretch were given, short sitting breaks needed. HEP updated to progress hip abductor strengthening, added isometric achillis tendon loading and stretching to calf. Standing soleus stretch deferred d/t inc knee discomfort. Pt ended session reporting no pain in heel and knees.    Personal Factors and Comorbidities Social Background    Stability/Clinical Decision Making Evolving/Moderate complexity    Clinical Decision Making Moderate    Rehab Potential Good    PT Frequency 2x / week    PT Duration 8 weeks    PT Treatment/Interventions ADLs/Self Care Home Management;Aquatic  Therapy;Cryotherapy;Moist Heat;Gait training;DME Instruction;Neuromuscular re-education;Balance training;Therapeutic exercise;Therapeutic activities;Functional mobility training;Stair training;Patient/family education;Manual techniques;Passive range of motion    PT Next Visit Plan Follow up with heel pain and updated HEP    PT Home Exercise Plan Access Code: [email or web address] (updated)    Consulted and Agree with Plan  of Care Patient             Patient will benefit from skilled therapeutic intervention in order to improve the following deficits and impairments:  Abnormal gait, Decreased range of motion, Difficulty walking, Impaired tone, Decreased endurance, Obesity, Pain, Decreased activity tolerance, Decreased balance, Hypomobility, Impaired flexibility, Improper body mechanics, Postural dysfunction, Decreased mobility, Decreased strength  Visit Diagnosis: Knee joint stiffness, bilateral  Muscle weakness (generalized)  Gait difficulty  Chronic pain of both knees  Pain of left heel     Problem List Patient Active Problem List   Diagnosis Date Noted   SOB (shortness of breath) 10/10/2017   Coronary artery calcification 09/19/2016   Chest pain 02/03/2016   Elevated troponin 02/03/2016   Anxiety 02/03/2016   Essential hypertension 02/03/2016   Mixed hyperlipidemia 02/03/2016   Chest tightness 01/01/2016   Pura Spice, PT, DPT # [id number] Joelene Millin, SPT 06/25/2020, 12:14 PM  [facility] [address] [place], [place], [address] Phone: [phone]   Fax:  [phone]  Name: Joanne Lewis MRN: [id number] Date of Birth: Jul 20, 1945

## 2020-06-26 ENCOUNTER — Encounter: Payer: Medicare Other | Admitting: Physical Therapy

## 2020-06-30 ENCOUNTER — Encounter: Payer: Self-pay | Admitting: Physical Therapy

## 2020-06-30 ENCOUNTER — Other Ambulatory Visit: Payer: Self-pay

## 2020-06-30 ENCOUNTER — Ambulatory Visit: Payer: Medicare Other | Admitting: Physical Therapy

## 2020-06-30 DIAGNOSIS — M25661 Stiffness of right knee, not elsewhere classified: Secondary | ICD-10-CM | POA: Diagnosis not present

## 2020-06-30 DIAGNOSIS — M79672 Pain in left foot: Secondary | ICD-10-CM

## 2020-06-30 DIAGNOSIS — M25662 Stiffness of left knee, not elsewhere classified: Secondary | ICD-10-CM

## 2020-06-30 DIAGNOSIS — G8929 Other chronic pain: Secondary | ICD-10-CM

## 2020-06-30 DIAGNOSIS — M6281 Muscle weakness (generalized): Secondary | ICD-10-CM

## 2020-06-30 DIAGNOSIS — R269 Unspecified abnormalities of gait and mobility: Secondary | ICD-10-CM

## 2020-06-30 DIAGNOSIS — M25561 Pain in right knee: Secondary | ICD-10-CM

## 2020-06-30 NOTE — Therapy (Signed)
[facility] [address] [place], [place], [address] Phone: [phone]   Fax:  [phone]  Physical Therapy Treatment  Patient Details  Name: Joanne Lewis MRN: [id number] Date of Birth: 04-25-1945 Referring Provider (PT): Dr. 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[place])    Anginal pain ([place])    Arthritis    Chronic kidney disease    moderate   Fatigue    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 11/25/2017   Hyperkalemia    Hyperlipidemia    Hypertension    Pneumonia    at least 10 years ago   Pre-diabetes     Past Surgical History:  Procedure Laterality Date   COLONOSCOPY WITH PROPOFOL N/A 08/09/2016   Procedure: COLONOSCOPY WITH PROPOFOL;  Surgeon: Manya Silvas, MD;  Location: [facility] ENDOSCOPY;  Service: Endoscopy;  Laterality: N/A;   DILATATION & CURETTAGE/HYSTEROSCOPY WITH MYOSURE N/A 02/20/2018   Procedure: DILATATION & CURETTAGE/HYSTEROSCOPY WITH MYOSURE;  Surgeon: Schermerhorn, Gwen Her, MD;  Location: [facility] ORS;  Service: Gynecology;  Laterality: N/A;   DILATION AND CURETTAGE OF UTERUS N/A 02/20/2018   Procedure: DILATATION AND CURETTAGE, fractional, resection of endometrial mass, possible myosure, hysteroscopy;  Surgeon: Schermerhorn, Gwen Her, MD;  Location: [facility] ORS;  Service: Gynecology;  Laterality: N/A;   FEMORAL HERNIA REPAIR Right 11/26/2017   Procedure: HERNIA REPAIR FEMORAL with insertion or mesh;   Surgeon: Vickie Epley, MD;  Location: [facility] ORS;  Service: General;  Laterality: Right;   HYSTEROSCOPY WITH D & C N/A 02/20/2018   Procedure: DILATATION AND CURETTAGE /HYSTEROSCOPY,;  Surgeon: Schermerhorn, Gwen Her, MD;  Location: [facility] ORS;  Service: Gynecology;  Laterality: N/A;    There were no vitals filed for this visit.    Therex:  Nustep: S5, L10, x10 minutes to promote tissue extensibility and circulation.   Seated: LAQ L-R, 4# ankle weight x10 each  Standing in //bar: 1) 4# ankle weight, L-R, knee flexion x20 each/ hip abduction x10 each. 2) Pro-stretch L-R 3x30s, Vc to prevent posterior shift at hip. 3) DF stretch on Airex balance beam 3x30s with 20s rest in between. 4 way Resisted walking with [place] x3 laps each way.   STS: 3x10 focused on proper technique/ concentric contraction during ascending phase (on blue mat table).     Manual:   Supine: Passive stretch to R knee extension, R hip flexion (R gluteal tightness reported), R DF, L knee extension, L Hip flexion with knee flexed (stiffness in L knee reported), L DF stretch. 26mn.   Pt educated on potential R heel lift post surgery.       PT Long Term Goals - 06/16/20 1256       PT LONG TERM GOAL #1   Title Pt. independent with HEP to increase B knee extension  to <-20 deg. to improve standing posture/ gait pattern.    Baseline significant knee joint AROM limitations: L (-25 to 112 deg.), R (-26 to 104 deg.)    Time 8    Period Weeks    Status On-going    Target Date 07/09/20      PT LONG TERM GOAL #2   Title Pt. able to stand/ walking >10 minutes with no increase c/o knee pain or rest breaks.    Baseline Pt. limited to <5 minutes of standing.  6/6: Pt. reports improvement with standing at church with less wt. shifting    Time 8    Period Weeks    Status Partially Met    Target Date 07/09/20      PT LONG TERM GOAL #3   Title Pt. able to complete aquatic exercise program with no increase c/o knee  pain or limitations to prepare of TKA.    Baseline pt. currently not exercising.    Time 8    Period Weeks    Status On-going    Target Date 07/09/20      PT LONG TERM GOAL #4   Title Pt. will complete FOTO and score goal to improve pain-free mobility.    Baseline TBD    Time 8    Period Weeks    Status Unable to assess    Target Date 07/09/20                   Plan - 06/30/20 1415     Clinical Impression Statement Pt presented to session reporting increase L heel pain d/t walking on beach past weekend. Pro-stretch trialed for calf stretching, no success d/t difficulty to maintain correct hip position. Pt responded better to DF stretch on Airex balance beam as evidenced by easier to maintain heel down/hip forward posture. Passive manual stretch prescribed to improve mobility. Pt was fatigued after resisted walking no increased in pain reported. Intermittent L lateral foot discomfort reported post session. Pt will check back in regards it next session.    Personal Factors and Comorbidities Social Background    Stability/Clinical Decision Making Evolving/Moderate complexity    Clinical Decision Making Moderate    Rehab Potential Good    PT Frequency 2x / week    PT Duration 8 weeks    PT Treatment/Interventions ADLs/Self Care Home Management;Aquatic Therapy;Cryotherapy;Moist Heat;Gait training;DME Instruction;Neuromuscular re-education;Balance training;Therapeutic exercise;Therapeutic activities;Functional mobility training;Stair training;Patient/family education;Manual techniques;Passive range of motion    PT Next Visit Plan Check L lateral heal pain/ continue with knee mobility ex.    PT Home Exercise Plan Access Code: [email or web address] (updated)    Consulted and Agree with Plan of Care Patient             Patient will benefit from skilled therapeutic intervention in order to improve the following deficits and impairments:  Abnormal gait, Decreased range of motion, Difficulty  walking, Impaired tone, Decreased endurance, Obesity, Pain, Decreased activity tolerance, Decreased balance, Hypomobility, Impaired flexibility, Improper body mechanics, Postural dysfunction, Decreased mobility, Decreased strength  Visit Diagnosis: Knee joint stiffness, bilateral  Muscle weakness (generalized)  Gait difficulty  Chronic pain of both knees  Pain of left heel     Problem List Patient Active Problem List   Diagnosis Date Noted   SOB (shortness of breath) 10/10/2017   Coronary artery calcification 09/19/2016   Chest pain 02/03/2016   Elevated troponin 02/03/2016   Anxiety 02/03/2016   Essential hypertension 02/03/2016   Mixed hyperlipidemia 02/03/2016  Chest tightness 01/01/2016   Pura Spice, PT, DPT # [id number] Shirley Friar, SPT 06/30/2020, 2:26 PM  [facility] [address] [place], [place], [address] Phone: [phone]   Fax:  [phone]  Name: Joanne Lewis MRN: [id number] Date of Birth: 10-27-1945

## 2020-06-30 NOTE — Therapy (Deleted)
[facility] [address] [place], [place], [address] Phone: [phone]   Fax:  [phone]  Physical Therapy Treatment  Patient Details  Name: Joanne Lewis MRN: [id number] Date of Birth: November 03, 1945 Referring Provider (PT): Dr. 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[place])    Anginal pain ([place])    Arthritis    Chronic kidney disease    moderate   Fatigue    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 11/25/2017   Hyperkalemia    Hyperlipidemia    Hypertension    Pneumonia    at least 10 years ago   Pre-diabetes     Past Surgical History:  Procedure Laterality Date   COLONOSCOPY WITH PROPOFOL N/A 08/09/2016   Procedure: COLONOSCOPY WITH PROPOFOL;  Surgeon: Manya Silvas, MD;  Location: [facility] ENDOSCOPY;  Service: Endoscopy;  Laterality: N/A;   DILATATION & CURETTAGE/HYSTEROSCOPY WITH MYOSURE N/A 02/20/2018   Procedure: DILATATION & CURETTAGE/HYSTEROSCOPY WITH MYOSURE;  Surgeon: Schermerhorn, Gwen Her, MD;  Location: [facility] ORS;  Service: Gynecology;  Laterality: N/A;   DILATION AND CURETTAGE OF UTERUS N/A 02/20/2018   Procedure: DILATATION AND CURETTAGE, fractional, resection of endometrial mass, possible myosure, hysteroscopy;  Surgeon: Schermerhorn, Gwen Her, MD;  Location: [facility] ORS;  Service: Gynecology;  Laterality: N/A;   FEMORAL HERNIA REPAIR Right 11/26/2017   Procedure: HERNIA REPAIR FEMORAL with insertion or mesh;  Surgeon: Vickie Epley, MD;  Location: [facility] ORS;  Service: General;  Laterality: Right;   HYSTEROSCOPY WITH D & C N/A 02/20/2018   Procedure: DILATATION AND CURETTAGE /HYSTEROSCOPY,;  Surgeon: Schermerhorn, Gwen Her, MD;  Location: [facility] ORS;  Service: Gynecology;  Laterality: N/A;    There were no vitals filed for this visit.     Pt reports mild "mental fog-and not quite like herself'. Pt believes  that it could be due to return from beach yesterday.                               PT Long Term Goals - 06/16/20 1256       PT LONG TERM GOAL #1   Title Pt. independent with HEP to increase B knee extension to <-20 deg. to improve standing posture/ gait pattern.    Baseline significant knee joint AROM limitations: L (-25 to 112 deg.), R (-26 to 104 deg.)    Time 8    Period Weeks    Status On-going    Target Date 07/09/20      PT LONG TERM GOAL #2   Title Pt. able to stand/ walking >10 minutes with no increase c/o knee pain or rest breaks.    Baseline Pt. limited to <5 minutes of standing.  6/6: Pt. reports improvement with standing at church with less wt. shifting    Time 8    Period Weeks    Status Partially Met    Target Date 07/09/20      PT LONG TERM GOAL #3   Title Pt. able to complete aquatic exercise program with no increase c/o knee pain or limitations to prepare of TKA.    Baseline pt. currently not exercising.    Time 8    Period Weeks    Status On-going    Target Date 07/09/20      PT LONG TERM GOAL #4  Title Pt. will complete FOTO and score goal to improve pain-free mobility.    Baseline TBD    Time 8    Period Weeks    Status Unable to assess    Target Date 07/09/20                    Patient will benefit from skilled therapeutic intervention in order to improve the following deficits and impairments:     Visit Diagnosis: No diagnosis found.     Problem List Patient Active Problem List   Diagnosis Date Noted   SOB (shortness of breath) 10/10/2017   Coronary artery calcification 09/19/2016   Chest pain 02/03/2016   Elevated troponin 02/03/2016   Anxiety 02/03/2016   Essential hypertension 02/03/2016   Mixed hyperlipidemia 02/03/2016   Chest tightness 01/01/2016    Fara Olden 06/30/2020, 1:03 PM  [facility] [address] [place], [place],  [address] Phone: [phone]   Fax:  [phone]  Name: Joanne Lewis MRN: [id number] Date of Birth: 1945/12/26

## 2020-07-02 ENCOUNTER — Encounter: Payer: Self-pay | Admitting: Physical Therapy

## 2020-07-02 ENCOUNTER — Other Ambulatory Visit: Payer: Self-pay

## 2020-07-02 ENCOUNTER — Ambulatory Visit: Payer: Medicare Other | Admitting: Physical Therapy

## 2020-07-02 DIAGNOSIS — G8929 Other chronic pain: Secondary | ICD-10-CM

## 2020-07-02 DIAGNOSIS — M79672 Pain in left foot: Secondary | ICD-10-CM

## 2020-07-02 DIAGNOSIS — M6281 Muscle weakness (generalized): Secondary | ICD-10-CM

## 2020-07-02 DIAGNOSIS — M25661 Stiffness of right knee, not elsewhere classified: Secondary | ICD-10-CM | POA: Diagnosis not present

## 2020-07-02 DIAGNOSIS — M25562 Pain in left knee: Secondary | ICD-10-CM

## 2020-07-02 DIAGNOSIS — R269 Unspecified abnormalities of gait and mobility: Secondary | ICD-10-CM

## 2020-07-02 DIAGNOSIS — M25662 Stiffness of left knee, not elsewhere classified: Secondary | ICD-10-CM

## 2020-07-02 NOTE — Therapy (Signed)
[facility] [address] [place], [place], [address] Phone: [phone]   Fax:  [phone]  Physical Therapy Treatment  Patient Details  Name: TIHANNA GOODSON MRN: [id number] Date of Birth: 1945-06-04 Referring Provider (PT): Dr. 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[place])    Anginal pain ([place])    Arthritis    Chronic kidney disease    moderate   Fatigue    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 11/25/2017   Hyperkalemia    Hyperlipidemia    Hypertension    Pneumonia    at least 10 years ago   Pre-diabetes     Past Surgical History:  Procedure Laterality Date   COLONOSCOPY WITH PROPOFOL N/A 08/09/2016   Procedure: COLONOSCOPY WITH PROPOFOL;  Surgeon: Manya Silvas, MD;  Location: [facility] ENDOSCOPY;  Service: Endoscopy;  Laterality: N/A;   DILATATION & CURETTAGE/HYSTEROSCOPY WITH MYOSURE N/A 02/20/2018   Procedure: DILATATION & CURETTAGE/HYSTEROSCOPY WITH MYOSURE;  Surgeon: Schermerhorn, Gwen Her, MD;  Location: [facility] ORS;  Service: Gynecology;  Laterality: N/A;   DILATION AND CURETTAGE OF UTERUS N/A 02/20/2018   Procedure: DILATATION AND CURETTAGE, fractional, resection of endometrial mass, possible myosure, hysteroscopy;  Surgeon: Schermerhorn, Gwen Her, MD;  Location: [facility] ORS;  Service: Gynecology;  Laterality: N/A;   FEMORAL HERNIA REPAIR Right 11/26/2017   Procedure: HERNIA REPAIR FEMORAL with insertion or mesh;   Surgeon: Vickie Epley, MD;  Location: [facility] ORS;  Service: General;  Laterality: Right;   HYSTEROSCOPY WITH D & C N/A 02/20/2018   Procedure: DILATATION AND CURETTAGE /HYSTEROSCOPY,;  Surgeon: Schermerhorn, Gwen Her, MD;  Location: [facility] ORS;  Service: Gynecology;  Laterality: N/A;    There were no vitals filed for this visit.   Subjective Assessment - 07/02/20 1055     Subjective Pt continues to report L heel pain and bilat quad fatigue as the most limiting factor. Pt stated that she felt no heel pain several hours after pool exercise class.    Pertinent History Pt. states she does not use assistive device.  No h/o falls.  Pt. currently not participating with exercise but interested in starting.  Pt. sleeps in bed.  Pt notes she has a house at [facility] that she was fixing some of the areas.    Limitations Standing;Walking;House hold activities    How long can you stand comfortably? <5 min.    How long can you walk comfortably? limited due to B quad fatigue/ increase knee pain (no assistive device).    Patient Stated Goals Prepare for B TKA/ increase quad strength/ pain mgmt.    Currently in Pain? Yes    Pain Score 4     Pain Location Heel    Pain Orientation Left;Medial  Therapeutic Exercise:   10 min NuStep. Discussed continued L heel pain possible contributed to fat pad atrophy.    Gait training with verbal cueing to increase posterior heel strike to decrease heel pain.    Supine:  Bridges x20 with adduction ball squeeze and x25 with GTB abduction hold. Verbal cueing to facilitate proper hip ext at the top of the bridge.    Bilat calf stretch at the steps 30x3   Audelia Hives Therapy:   Prolonged stretching with therapist assist OP in supine: hamstring, hip flexion, and hip flexion with abd to target post hip tightness.   Bilat posterior femoral mobs in supine Grad 2-3 to promote knee ext.         PT Long Term Goals - 06/16/20 1256       PT LONG  TERM GOAL #1   Title Pt. independent with HEP to increase B knee extension to <-20 deg. to improve standing posture/ gait pattern.    Baseline significant knee joint AROM limitations: L (-25 to 112 deg.), R (-26 to 104 deg.)    Time 8    Period Weeks    Status On-going    Target Date 07/09/20      PT LONG TERM GOAL #2   Title Pt. able to stand/ walking >10 minutes with no increase c/o knee pain or rest breaks.    Baseline Pt. limited to <5 minutes of standing.  6/6: Pt. reports improvement with standing at church with less wt. shifting    Time 8    Period Weeks    Status Partially Met    Target Date 07/09/20      PT LONG TERM GOAL #3   Title Pt. able to complete aquatic exercise program with no increase c/o knee pain or limitations to prepare of TKA.    Baseline pt. currently not exercising.    Time 8    Period Weeks    Status On-going    Target Date 07/09/20      PT LONG TERM GOAL #4   Title Pt. will complete FOTO and score goal to improve pain-free mobility.    Baseline TBD    Time 8    Period Weeks    Status Unable to assess    Target Date 07/09/20                   Plan - 07/02/20 1059     Clinical Impression Statement Pt provided an excellent effort during today's tx and was in a great mental status. Pt experience decrease heel pain during gait with verbal cueing to promote heel strike at the posterior aspect of the heel compared to the medial center of the heel. Pt reported decrease knee discomfort when walking post femoral mobs. Pt. will continue to benefit from skilled physical therapy to progress POC to address remaining deficits to facilitate maximum functional capacity for optimal personal health and wellness for ADLs.    Personal Factors and Comorbidities Social Background    Stability/Clinical Decision Making Evolving/Moderate complexity    Clinical Decision Making Moderate    Rehab Potential Good    PT Frequency 2x / week    PT Duration 8 weeks    PT  Treatment/Interventions ADLs/Self Care Home Management;Aquatic Therapy;Cryotherapy;Moist Heat;Gait training;DME Instruction;Neuromuscular re-education;Balance training;Therapeutic exercise;Therapeutic activities;Functional mobility training;Stair training;Patient/family education;Manual techniques;Passive range of motion    PT Next Visit Plan Check L lateral heal pain/ continue with knee mobility ex.    PT Home Exercise Plan Access  Code: [email or web address] (updated)    Consulted and Agree with Plan of Care Patient             Patient will benefit from skilled therapeutic intervention in order to improve the following deficits and impairments:  Abnormal gait, Decreased range of motion, Difficulty walking, Impaired tone, Decreased endurance, Obesity, Pain, Decreased activity tolerance, Decreased balance, Hypomobility, Impaired flexibility, Improper body mechanics, Postural dysfunction, Decreased mobility, Decreased strength  Visit Diagnosis: Knee joint stiffness, bilateral  Muscle weakness (generalized)  Gait difficulty  Chronic pain of both knees  Pain of left heel     Problem List Patient Active Problem List   Diagnosis Date Noted   SOB (shortness of breath) 10/10/2017   Coronary artery calcification 09/19/2016   Chest pain 02/03/2016   Elevated troponin 02/03/2016   Anxiety 02/03/2016   Essential hypertension 02/03/2016   Mixed hyperlipidemia 02/03/2016   Chest tightness 01/01/2016   Pura Spice, PT, DPT # [id number] Fara Olden SPT 07/02/2020, 7:23 PM   [facility] [address] [place], [place], [address] Phone: [phone]   Fax:  [phone]  Name: ANYA MURPHEY MRN: [id number] Date of Birth: August 07, 1945

## 2020-07-07 ENCOUNTER — Other Ambulatory Visit: Payer: Self-pay

## 2020-07-07 ENCOUNTER — Encounter: Payer: Self-pay | Admitting: Physical Therapy

## 2020-07-07 ENCOUNTER — Ambulatory Visit: Payer: Medicare Other

## 2020-07-07 DIAGNOSIS — M25562 Pain in left knee: Secondary | ICD-10-CM

## 2020-07-07 DIAGNOSIS — M25661 Stiffness of right knee, not elsewhere classified: Secondary | ICD-10-CM

## 2020-07-07 DIAGNOSIS — M6281 Muscle weakness (generalized): Secondary | ICD-10-CM

## 2020-07-07 DIAGNOSIS — M79672 Pain in left foot: Secondary | ICD-10-CM

## 2020-07-07 DIAGNOSIS — G8929 Other chronic pain: Secondary | ICD-10-CM

## 2020-07-07 DIAGNOSIS — R269 Unspecified abnormalities of gait and mobility: Secondary | ICD-10-CM

## 2020-07-07 NOTE — Therapy (Signed)
[place] [facility] [address] [place], [place], [address] Phone: [phone]   Fax:  [phone]  Physical Therapy Treatment  Patient Details  Name: Joanne Lewis MRN: [id number] Date of Birth: [birth year] Referring Provider (PT): Dr. Frazier Richards   Encounter Date: 07/07/2020   PT End of Session - 07/07/20 1104     Visit Number 14    Number of Visits 17    Date for PT Re-Evaluation 07/09/20    Authorization Type Traditional medicare A&B    Authorization Time Period 05/14/20-07/09/20    Authorization - Visit Number 5    Authorization - Number of Visits 10    PT Start Time 1443    PT Stop Time 1040    PT Time Calculation (min) 56 min    Activity Tolerance Patient tolerated treatment well    Behavior During Therapy [facility] for tasks assessed/performed             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[place])    Anginal pain ([place])    Arthritis    Chronic kidney disease    moderate   Fatigue    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 11/25/2017   Hyperkalemia    Hyperlipidemia    Hypertension    Pneumonia    at least 10 years ago   Pre-diabetes     Past Surgical History:  Procedure Laterality Date   COLONOSCOPY WITH PROPOFOL N/A 08/09/2016   Procedure: COLONOSCOPY WITH PROPOFOL;  Surgeon: Manya Silvas, MD;  Location: [facility] ENDOSCOPY;  Service: Endoscopy;  Laterality: N/A;   DILATATION & CURETTAGE/HYSTEROSCOPY WITH MYOSURE N/A 02/20/2018   Procedure: DILATATION & CURETTAGE/HYSTEROSCOPY WITH MYOSURE;  Surgeon: Schermerhorn, Gwen Her, MD;  Location: [facility] ORS;  Service: Gynecology;  Laterality: N/A;   DILATION AND CURETTAGE OF UTERUS N/A 02/20/2018   Procedure: DILATATION AND CURETTAGE, fractional, resection of endometrial mass, possible myosure, hysteroscopy;  Surgeon: Schermerhorn, Gwen Her, MD;  Location: [facility] ORS;  Service: Gynecology;  Laterality: N/A;   FEMORAL  HERNIA REPAIR Right 11/26/2017   Procedure: HERNIA REPAIR FEMORAL with insertion or mesh;  Surgeon: Vickie Epley, MD;  Location: [facility] ORS;  Service: General;  Laterality: Right;   HYSTEROSCOPY WITH D & C N/A 02/20/2018   Procedure: DILATATION AND CURETTAGE /HYSTEROSCOPY,;  Surgeon: Schermerhorn, Gwen Her, MD;  Location: [facility] ORS;  Service: Gynecology;  Laterality: N/A;    There were no vitals filed for this visit.   Subjective Assessment - 07/07/20 1102     Subjective Pt presents to PT with increase lethargy 2/2 long weekend at the beach. Pt stated increase ankle soreness with new exercise (4/10), however, sx return to baseline within 48 hrs. Pt continues to report that her L heel is the most limiting factor at this time.    Pertinent History Pt. states she does not use assistive device.  No h/o falls.  Pt. currently not participating with exercise but interested in starting.  Pt. sleeps in bed.  Pt notes she has a house at [facility] that she was fixing some of the areas.    Limitations Standing;Walking;House hold activities    How long can you stand comfortably? 5-6 mins    How long can you walk comfortably? limited due to B quad fatigue/ increase knee pain (no assistive device).    Patient Stated Goals Prepare for B TKA/ increase quad strength/ pain mgmt.    Currently in  Pain? Yes    Pain Score 4     Pain Location Heel    Pain Orientation Left;Mid    Pain Descriptors / Indicators Aching;Sore                Therapeutic Exercise:     10 min NuStep. Discussed current progression in functional capacity from initial eval.  Pause of tx after next schedule visit (6/29).        Supine:   Bridges with 5sec hold x20 with BTB abduction hold.   Thera-ball bilat knee flex with bridge at knee ext position. X20  Bridge/quad set with bolster at distal tibial. X20.     Verbal cueing to facilitate proper hip ext at the top of the bridge-like-exercises.       Manual Therapy:      Prolonged stretching with therapist assist OP in Long-seated with back support 6 mins   Bilat posterior femoral mobs in supine Grad 2-3 to promote knee ext. 7 mins.     [facility] PT Assessment - 07/07/20 0001       Observation/Other Assessments   Focus on Therapeutic Outcomes (FOTO)  54      ROM / Strength   AROM / PROM / Strength Strength;PROM      PROM   PROM Assessment Site Knee    Right/Left Knee Right;Left    Right Knee Extension -10    Right Knee Flexion 110    Left Knee Extension -15    Left Knee Flexion 110      Strength   Strength Assessment Site Hip;Knee    Right/Left Hip Right;Left    Right Hip Flexion 4+/5    Right Hip ABduction --   horizontal ABDCT: 4+/5   Left Hip Flexion 4+/5    Left Hip ABduction --   horizontal ABDCT: 4+/5   Right/Left Knee Right;Left    Right Knee Flexion 5/5    Right Knee Extension 5/5    Left Knee Flexion 5/5    Left Knee Extension 5/5      Transfers   Five time sit to stand comments  14 sec   hands-free                    PT Long Term Goals - 07/07/20 0954       PT LONG TERM GOAL #1   Title Pt. independent with HEP to increase B knee extension to <-20 deg. to improve standing posture/ gait pattern.    Baseline significant knee joint AROM limitations: L (-25 to 112 deg.), R (-26 to 104 deg.) 6/27: R: (-10, 110)  L: (-15, 110)    Time 8    Period Weeks    Status Achieved      PT LONG TERM GOAL #2   Title Pt. able to stand/ walking >10 minutes with no increase c/o knee pain or rest breaks.    Baseline Pt. limited to <5 minutes of standing.  6/27: In clinic assessment:15mn:10 secs  Pt reported fatigue in bilat quads and pain in L heel.    Time 8    Period Weeks    Status Partially Met    Target Date 07/09/20      PT LONG TERM GOAL #3   Title Pt. able to complete aquatic exercise program with no increase c/o knee pain or limitations to prepare of TKA.    Baseline pt. currently not exercising. 6/27: Pt reports good  adherence with aquatic exercise program and plans  to continue until TKA/.    Time 8    Period Weeks    Status Achieved      PT LONG TERM GOAL #4   Title Pt. will complete FOTO and score goal to improve pain-free mobility.    Baseline Baseline 54. 6/27: 54    Time 8    Period Weeks    Status Not Met    Target Date 07/09/20                   Plan - 07/07/20 1108     Clinical Impression Statement Pt was reassess at this date. Reassessment revealed improvement in strength and ROM, no improvement in FOTO score was noted. Despite ROM/Strength improvement, pt continue to demonstrate significant deficits in TKE. Pt is scheduled for one additional visit for an in-depth HEP upgrade and education for continued therapeutic benefit before scheduled TKA.    Personal Factors and Comorbidities Social Background    Stability/Clinical Decision Making Evolving/Moderate complexity    Clinical Decision Making Moderate    Rehab Potential Good    PT Frequency 2x / week    PT Duration 8 weeks    PT Treatment/Interventions ADLs/Self Care Home Management;Aquatic Therapy;Cryotherapy;Moist Heat;Gait training;DME Instruction;Neuromuscular re-education;Balance training;Therapeutic exercise;Therapeutic activities;Functional mobility training;Stair training;Patient/family education;Manual techniques;Passive range of motion    PT Next Visit Plan UPDATE/COMBINE HEP for continued to promote independence in disease self-management    PT Home Exercise Plan Access Code: [email or web address] (updated 07/07/20)    Consulted and Agree with Plan of Care Patient             Patient will benefit from skilled therapeutic intervention in order to improve the following deficits and impairments:  Abnormal gait, Decreased range of motion, Difficulty walking, Impaired tone, Decreased endurance, Obesity, Pain, Decreased activity tolerance, Decreased balance, Hypomobility, Impaired flexibility, Improper body mechanics, Postural  dysfunction, Decreased mobility, Decreased strength  Visit Diagnosis: Knee joint stiffness, bilateral  Muscle weakness (generalized)  Gait difficulty  Chronic pain of both knees  Pain of left heel     Problem List Patient Active Problem List   Diagnosis Date Noted   SOB (shortness of breath) 10/10/2017   Coronary artery calcification 09/19/2016   Chest pain 02/03/2016   Elevated troponin 02/03/2016   Anxiety 02/03/2016   Essential hypertension 02/03/2016   Mixed hyperlipidemia 02/03/2016   Chest tightness 01/01/2016    Fara Olden 07/07/2020, 11:10 AM  [place] [facility] [address] [place], [place], [address] Phone: [phone]   Fax:  [phone]  Name: Joanne Lewis MRN: [id number] Date of Birth: 07-27-1945

## 2020-07-09 ENCOUNTER — Ambulatory Visit: Payer: Medicare Other

## 2020-07-09 ENCOUNTER — Other Ambulatory Visit: Payer: Self-pay

## 2020-07-09 DIAGNOSIS — R269 Unspecified abnormalities of gait and mobility: Secondary | ICD-10-CM

## 2020-07-09 DIAGNOSIS — M25661 Stiffness of right knee, not elsewhere classified: Secondary | ICD-10-CM

## 2020-07-09 DIAGNOSIS — M6281 Muscle weakness (generalized): Secondary | ICD-10-CM

## 2020-07-09 DIAGNOSIS — M79672 Pain in left foot: Secondary | ICD-10-CM

## 2020-07-09 NOTE — Therapy (Signed)
[facility] [address] [place], [place], [address] Phone: [phone]   Fax:  [phone]  Physical Therapy Treatment/Discharge Summary Period of care:  05/14/20 - 07/09/20  Patient Details  Name: Joanne Lewis MRN: [id number] Date of Birth: Jan 30, 1945 Referring Provider (PT): Dr. Frazier Richards   Encounter Date: 07/09/2020   PT End of Session - 07/09/20 0953     Visit Number 15    Number of Visits 17    Date for PT Re-Evaluation 07/09/20    Authorization Type Traditional medicare A&B    Authorization Time Period 05/14/20-07/09/20    Authorization - Visit Number 6    Authorization - Number of Visits 10    PT Start Time 0828    PT Stop Time 0902    PT Time Calculation (min) 34 min    Activity Tolerance Patient tolerated treatment well    Behavior During Therapy [facility] for tasks assessed/performed             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[place])    Anginal pain ([place])    Arthritis    Chronic kidney disease    moderate   Fatigue    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 11/25/2017   Hyperkalemia    Hyperlipidemia    Hypertension    Pneumonia    at least 10 years ago   Pre-diabetes     Past Surgical History:  Procedure Laterality Date   COLONOSCOPY WITH PROPOFOL N/A 08/09/2016   Procedure: COLONOSCOPY WITH PROPOFOL;  Surgeon: Manya Silvas, MD;  Location: [facility] ENDOSCOPY;  Service: Endoscopy;  Laterality: N/A;   DILATATION & CURETTAGE/HYSTEROSCOPY WITH MYOSURE N/A 02/20/2018   Procedure: DILATATION & CURETTAGE/HYSTEROSCOPY WITH MYOSURE;  Surgeon: Schermerhorn, Gwen Her, MD;  Location: [facility] ORS;  Service: Gynecology;  Laterality: N/A;   DILATION AND CURETTAGE OF UTERUS N/A 02/20/2018   Procedure: DILATATION AND CURETTAGE, fractional, resection of endometrial mass, possible myosure, hysteroscopy;  Surgeon: Schermerhorn, Gwen Her, MD;  Location: [facility] ORS;   Service: Gynecology;  Laterality: N/A;   FEMORAL HERNIA REPAIR Right 11/26/2017   Procedure: HERNIA REPAIR FEMORAL with insertion or mesh;  Surgeon: Vickie Epley, MD;  Location: [facility] ORS;  Service: General;  Laterality: Right;   HYSTEROSCOPY WITH D & C N/A 02/20/2018   Procedure: DILATATION AND CURETTAGE /HYSTEROSCOPY,;  Surgeon: Schermerhorn, Gwen Her, MD;  Location: [facility] ORS;  Service: Gynecology;  Laterality: N/A;    There were no vitals filed for this visit.   Subjective Assessment - 07/09/20 0935     Subjective Pt presents to session w/o pain in B knees, stating no issue with HEP. Pt continues to report L heel pain with weight bearing.    Pertinent History Pt. states she does not use assistive device.  No h/o falls.  Pt. currently not participating with exercise but interested in starting.  Pt. sleeps in bed.  Pt notes she has a house at [facility] that she was fixing some of the areas.    Limitations Standing;Walking;House hold activities    How long can you stand comfortably? 53mn    How long can you walk comfortably? limited due to B quad fatigue/ increase knee pain (no assistive device).    Patient Stated Goals Prepare for B TKA/ increase quad strength/ pain mgmt.    Currently in Pain? No/denies    Pain Score 0-No pain  Treatment:    Nustep: S7, L4, x10 minutes to promote tissue extensibility and circulation. Not billed.   Therex:  Updated HEP review. Given new handout:   1) Crab walk with band at knee, ed on lower band at ankle to progress 2) Mini squat tapping seat VC to correct slumped lower back  Pt educated on using pain in L achillis to modify stretching intensity, and using a softer insole for heel pain.       PT Long Term Goals - 07/09/20 1016       PT LONG TERM GOAL #1   Title Pt. independent with HEP to increase B knee extension to <-20 deg. to improve standing posture/ gait pattern.    Baseline significant knee joint AROM  limitations: L (-25 to 112 deg.), R (-26 to 104 deg.) 6/27: R: (-10, 110)  L: (-15, 110)    Time 8    Period Weeks    Status Achieved      PT LONG TERM GOAL #2   Title Pt. able to stand/ walking >10 minutes with no increase c/o knee pain or rest breaks.    Baseline Pt. limited to <5 minutes of standing.  6/27: In clinic assessment:41mn:10 secs  Pt reported fatigue in bilat quads and pain in L heel.    Time 8    Period Weeks    Status Achieved      PT LONG TERM GOAL #3   Title Pt. able to complete aquatic exercise program with no increase c/o knee pain or limitations to prepare of TKA.    Baseline pt. currently not exercising. 6/27: Pt reports good adherence with aquatic exercise program and plans to continue until TKA/.    Time 8    Period Weeks    Status Achieved      PT LONG TERM GOAL #4   Title Pt. will complete FOTO and score goal to improve pain-free mobility.    Baseline Baseline 54. 6/27: 54    Time 8    Period Weeks    Status Not Met                   Plan - 07/09/20 0954     Clinical Impression Statement Pt educated on updated HEP and reviewed body mechanics with squats heel stretch. Pain in the L achillis during stretch addressed with reduction in intensity. Pt also educated on use over-the-counter insole to reduce heel pain with weight bearing. Even though FOTO score is the same, pt has achieve all functional goals as evident by no pain w/ amublating and performing ADL. Pt demonstrated good understanding of exercise progression and discharge plan. Pt is getting ready for L TKA in August.    Personal Factors and Comorbidities Social Background    Stability/Clinical Decision Making Evolving/Moderate complexity    Clinical Decision Making Moderate    Rehab Potential Good    PT Next Visit Plan Updated HEP and discussed discharge plan    PT Home Exercise Plan Access Code: [email or web address](updated 07/09/20)    Consulted and Agree with Plan of Care Patient              Patient will benefit from skilled therapeutic intervention in order to improve the following deficits and impairments:     Visit Diagnosis: Knee joint stiffness, bilateral  Pain of left heel  Gait difficulty  Muscle weakness (generalized)     Problem List Patient Active Problem List   Diagnosis Date Noted   SOB (  shortness of breath) 10/10/2017   Coronary artery calcification 09/19/2016   Chest pain 02/03/2016   Elevated troponin 02/03/2016   Anxiety 02/03/2016   Essential hypertension 02/03/2016   Mixed hyperlipidemia 02/03/2016   Chest tightness 01/01/2016   Shirley Friar, SPT   [place] Caster. Fairly IV, PT, DPT Physical Therapist- [facility]  07/09/2020, 10:59 AM  [place] [address] [place], [place], [address] Phone: [phone]   Fax:  [phone]  Name: LASHAY OSBORNE MRN: [id number] Date of Birth: 02/06/1945

## 2020-07-24 NOTE — Progress Notes (Signed)
DUE TO COVID-19 ONLY ONE VISITOR IS ALLOWED TO COME WITH YOU AND STAY IN THE WAITING ROOM ONLY DURING PRE OP AND PROCEDURE DAY OF SURGERY. THE 1 VISITOR  MAY VISIT WITH YOU AFTER SURGERY IN YOUR PRIVATE ROOM DURING VISITING HOURS ONLY!  YOU NEED TO HAVE A COVID 19 TEST ON__  08/07/2020 _____ @_______ , THIS TEST MUST BE DONE BEFORE SURGERY,  COVID TESTING SITE [address] [place] , IT IS ON THE RIGHT GOING OUT [address] APPROXIMATELY  2 MINUTES PAST [employer] ON THE RIGHT. ONCE YOUR COVID TEST IS COMPLETED,  PLEASE BEGIN THE QUARANTINE INSTRUCTIONS AS OUTLINED IN YOUR HANDOUT.                [id number] [place]  07/24/2020   Your procedure is scheduled on:  08/11/2020   Report to [facility] Main  Entrance   Report to admitting at    0900 AM     Call this number if you have problems the morning of surgery [phone]    REMEMBER: NO  SOLID FOOD CANDY OR GUM AFTER MIDNIGHT. CLEAR LIQUIDS UNTIL     0830a,m        . NOTHING BY MOUTH EXCEPT CLEAR LIQUIDS UNTIL  0830am   . PLEASE FINISH ENSURE DRINK PER SURGEON ORDER  WHICH NEEDS TO BE COMPLETED AT   0830am    .      CLEAR LIQUID DIET   Foods Allowed                                                                    Coffee and tea, regular and decaf                            Fruit ices (not with fruit pulp)                                      Iced Popsicles                                    Carbonated beverages, regular and diet                                    Cranberry, grape and apple juices Sports drinks like Gatorade Lightly seasoned clear broth or consume(fat free) Sugar, honey syrup ___________________________________________________________________      BRUSH YOUR TEETH MORNING OF SURGERY AND RINSE YOUR MOUTH OUT, NO CHEWING GUM CANDY OR MINTS.     Take these medicines the morning of surgery with A SIP OF WATER:  amlodipine, coreg  DO NOT TAKE ANY DIABETIC MEDICATIONS DAY  OF YOUR SURGERY                               You may not have any metal on your body including hair pins and  piercings  Do not wear jewelry, make-up, lotions, powders or perfumes, deodorant             Do not wear nail polish on your fingernails.  Do not shave  48 hours prior to surgery.              Men may shave face and neck.   Do not bring valuables to the hospital. [place].  Contacts, dentures or bridgework may not be worn into surgery.  Leave suitcase in the car. After surgery it may be brought to your room.     Patients discharged the day of surgery will not be allowed to drive home. IF YOU ARE HAVING SURGERY AND GOING HOME THE SAME DAY, YOU MUST HAVE AN ADULT TO DRIVE YOU HOME AND BE WITH YOU FOR 24 HOURS. YOU MAY GO HOME BY TAXI OR UBER OR ORTHERWISE, BUT AN ADULT MUST ACCOMPANY YOU HOME AND STAY WITH YOU FOR 24 HOURS.  Name and phone number of your driver:  Special Instructions: N/A              Please read over the following fact sheets you were given: _____________________________________________________________________  [facility] - Preparing for Surgery Before surgery, you can play an important role.  Because skin is not sterile, your skin needs to be as free of germs as possible.  You can reduce the number of germs on your skin by washing with CHG (chlorahexidine gluconate) soap before surgery.  CHG is an antiseptic cleaner which kills germs and bonds with the skin to continue killing germs even after washing. Please DO NOT use if you have an allergy to CHG or antibacterial soaps.  If your skin becomes reddened/irritated stop using the CHG and inform your nurse when you arrive at Short Stay. Do not shave (including legs and underarms) for at least 48 hours prior to the first CHG shower.  You may shave your face/neck. Please follow these instructions carefully:  1.  Shower with CHG Soap the night before surgery  and the  morning of Surgery.  2.  If you choose to wash your hair, wash your hair first as usual with your  normal  shampoo.  3.  After you shampoo, rinse your hair and body thoroughly to remove the  shampoo.                           4.  Use CHG as you would any other liquid soap.  You can apply chg directly  to the skin and wash                       Gently with a scrungie or clean washcloth.  5.  Apply the CHG Soap to your body ONLY FROM THE NECK DOWN.   Do not use on face/ open                           Wound or open sores. Avoid contact with eyes, ears mouth and genitals (private parts).                       Wash face,  Genitals (private parts) with your normal soap.             6.  Wash thoroughly, paying special attention to the area where your surgery  will be performed.  7.  Thoroughly rinse your body with warm water from the neck down.  8.  DO NOT shower/wash with your normal soap after using and rinsing off  the CHG Soap.                9.  Pat yourself dry with a clean towel.            10.  Wear clean pajamas.            11.  Place clean sheets on your bed the night of your first shower and do not  sleep with pets. Day of Surgery : Do not apply any lotions/deodorants the morning of surgery.  Please wear clean clothes to the hospital/surgery center.  FAILURE TO FOLLOW THESE INSTRUCTIONS MAY RESULT IN THE CANCELLATION OF YOUR SURGERY PATIENT SIGNATURE_________________________________  NURSE SIGNATURE__________________________________  ________________________________________________________________________

## 2020-07-29 ENCOUNTER — Encounter (HOSPITAL_COMMUNITY)
Admission: RE | Admit: 2020-07-29 | Discharge: 2020-07-29 | Disposition: A | Discharge status: Home / Self Care | Payer: Medicare Other | Source: Ambulatory Visit | Attending: Internal Medicine | Admitting: Internal Medicine

## 2020-07-29 ENCOUNTER — Encounter (HOSPITAL_COMMUNITY): Payer: Self-pay

## 2020-07-29 ENCOUNTER — Other Ambulatory Visit: Payer: Self-pay

## 2020-07-29 DIAGNOSIS — I452 Bifascicular block: Secondary | ICD-10-CM | POA: Insufficient documentation

## 2020-07-29 DIAGNOSIS — Z01818 Encounter for other preprocedural examination: Secondary | ICD-10-CM | POA: Diagnosis present

## 2020-07-29 HISTORY — DX: Retinal artery branch occlusion, left eye: H34.232

## 2020-07-29 LAB — PROTIME-INR
INR: 1 (ref 0.8–1.2)
Prothrombin Time: 12.9 seconds (ref 11.4–15.2)

## 2020-07-29 LAB — COMPREHENSIVE METABOLIC PANEL
ALT: 24 U/L (ref 0–44)
AST: 27 U/L (ref 15–41)
Albumin: 4.5 g/dL (ref 3.5–5.0)
Alkaline Phosphatase: 108 U/L (ref 38–126)
Anion gap: 10 (ref 5–15)
BUN: 24 mg/dL — ABNORMAL HIGH (ref 8–23)
CO2: 24 mmol/L (ref 22–32)
Calcium: 9.2 mg/dL (ref 8.9–10.3)
Chloride: 94 mmol/L — ABNORMAL LOW (ref 98–111)
Creatinine, Ser: 1.09 mg/dL — ABNORMAL HIGH (ref 0.44–1.00)
GFR, Estimated: 54 mL/min — ABNORMAL LOW (ref >60)
Glucose, Bld: 108 mg/dL — ABNORMAL HIGH (ref 70–99)
Potassium: 4.9 mmol/L (ref 3.5–5.1)
Sodium: 128 mmol/L — ABNORMAL LOW (ref 135–145)
Total Bilirubin: 0.8 mg/dL (ref 0.3–1.2)
Total Protein: 7.8 g/dL (ref 6.5–8.1)

## 2020-07-29 LAB — CBC
HCT: 43.1 % (ref 36.0–46.0)
Hemoglobin: 14.7 g/dL (ref 12.0–15.0)
MCH: 29.6 pg (ref 26.0–34.0)
MCHC: 34.1 g/dL (ref 30.0–36.0)
MCV: 86.9 fL (ref 80.0–100.0)
Platelets: 222 10*3/uL (ref 150–400)
RBC: 4.96 MIL/uL (ref 3.87–5.11)
RDW: 12 % (ref 11.5–15.5)
WBC: 7.9 10*3/uL (ref 4.0–10.5)
nRBC: 0 % (ref 0.0–0.2)

## 2020-07-29 LAB — TYPE AND SCREEN
ABO/RH(D): B POS
Antibody Screen: NEGATIVE

## 2020-07-29 LAB — SURGICAL PCR SCREEN
MRSA, PCR: NEGATIVE
Staphylococcus aureus: NEGATIVE

## 2020-07-29 LAB — HEMOGLOBIN A1C
Hgb A1c MFr Bld: 5.6 % (ref 4.8–5.6)
Mean Plasma Glucose: 114.02 mg/dL

## 2020-07-29 LAB — APTT: aPTT: 25 seconds (ref 24–36)

## 2020-07-29 NOTE — Progress Notes (Addendum)
Anesthesia Review:  PCP: DR Einar Crow with [facility] 06/11/20  on chart  - saw MD for clearance have requested clearance from [facility] on 07/29/20 Cardiologist : DR Julien Nordmann [facility] 10/12/2017  Kidney- Dr Cherylann Ratel- [facility]  07/03/20- [facility] on ochart  Endocrinologist- DR Charlton Haws with [facility] 07/25/2019  Chest x-ray : EKG : 07/29/2020  EKG viewed by Rondell Reams on 07/29/20 NO new orders given.  Echo : Stress test: 2017  Cardiac Cath :  Activity level: can do a flight of stairs without difficulty  Sleep Study/ CPAP : none  Fasting Blood Sugar :      / Checks Blood Sugar -- times a day:   Blood Thinner/ Instructions /Last Dose: ASA / Instructions/ Last Dose :   Hgba1c-07/29/20 PreDiabetes  CMP done 07/29/20 routed to Dr Benedetto Coons. Sheilah Mins made aware of Sodium of 128 on 07/29/2020.  No new orders given.

## 2020-07-29 NOTE — H&P (Signed)
TOTAL KNEE ADMISSION H&P  Patient is being admitted for left total knee arthroplasty.  Subjective:  Chief Complaint: Left knee pain.  HPI: Joanne Lewis, 75 y.o. female has a history of pain and functional disability in the left knee due to arthritis and has failed non-surgical conservative treatments for greater than 12 weeks to include NSAID's and/or analgesics and activity modification. Onset of symptoms was gradual, starting  several  years ago with gradually worsening course since that time. The patient noted no past surgery on the left knee.  Patient currently rates pain in the left knee at 7 out of 10 with activity. Patient has worsening of pain with activity and weight bearing and pain that interferes with activities of daily living. Patient has evidence of periarticular osteophytes and joint space narrowing by imaging studies. There is no active infection.  Patient Active Problem List   Diagnosis Date Noted   SOB (shortness of breath) 10/10/2017   Coronary artery calcification 09/19/2016   Chest pain 02/03/2016   Elevated troponin 02/03/2016   Anxiety 02/03/2016   Essential hypertension 02/03/2016   Mixed hyperlipidemia 02/03/2016   Chest tightness 01/01/2016    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HCC)    Anginal pain (HCC)    Arthritis    Chronic kidney disease    moderate   Fatigue    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 11/25/2017   Hyperkalemia    Hyperlipidemia    Hypertension    Pneumonia    at least 10 years ago   Pre-diabetes     Past Surgical History:  Procedure Laterality Date   COLONOSCOPY WITH PROPOFOL N/A 08/09/2016   Procedure: COLONOSCOPY WITH PROPOFOL;  Surgeon: Scot Jun, MD;  Location: [facility] ENDOSCOPY;  Service: Endoscopy;  Laterality: N/A;   DILATATION & CURETTAGE/HYSTEROSCOPY WITH MYOSURE N/A 02/20/2018   Procedure: DILATATION & CURETTAGE/HYSTEROSCOPY WITH MYOSURE;  Surgeon:  Schermerhorn, Ihor Austin, MD;  Location: [facility] ORS;  Service: Gynecology;  Laterality: N/A;   DILATION AND CURETTAGE OF UTERUS N/A 02/20/2018   Procedure: DILATATION AND CURETTAGE, fractional, resection of endometrial mass, possible myosure, hysteroscopy;  Surgeon: Schermerhorn, Ihor Austin, MD;  Location: [facility] ORS;  Service: Gynecology;  Laterality: N/A;   FEMORAL HERNIA REPAIR Right 11/26/2017   Procedure: HERNIA REPAIR FEMORAL with insertion or mesh;  Surgeon: Ancil Linsey, MD;  Location: [facility] ORS;  Service: General;  Laterality: Right;   HYSTEROSCOPY WITH D & C N/A 02/20/2018   Procedure: DILATATION AND CURETTAGE /HYSTEROSCOPY,;  Surgeon: Schermerhorn, Ihor Austin, MD;  Location: [facility] ORS;  Service: Gynecology;  Laterality: N/A;    Prior to Admission medications   Medication Sig Start Date End Date Taking? Authorizing Provider  amLODipine (NORVASC) 10 MG tablet Take 5 mg by mouth in the morning. 06/16/20  Yes [provider]  atorvastatin (LIPITOR) 20 MG tablet Take 20 mg by mouth every evening.    Yes [provider]  carvedilol (COREG) 6.25 MG tablet Take 6.25 mg by mouth 2 (two) times daily. 06/06/20  Yes [provider]  latanoprost (XALATAN) 0.005 % ophthalmic solution Place 1 drop into both eyes at bedtime.   Yes [provider]  losartan-hydrochlorothiazide (HYZAAR) 100-12.5 MG tablet Take 1 tablet by mouth in the morning. 06/11/20  Yes [provider]  timolol (TIMOPTIC) 0.5 % ophthalmic solution Place 1 drop into both eyes in the morning. 06/26/20  Yes [provider]    Allergies  Allergen Reactions   Codeine Diarrhea and Nausea And Vomiting    Social History   Socioeconomic History   Marital status: Divorced    Spouse name: Not on file   Number of children: Not on file   Years of education: Not on file   Highest education level: Not on file  Occupational History   Not on file  Tobacco Use   Smoking status: Never   Smokeless  tobacco: Never  Substance and Sexual Activity   Alcohol use: No   Drug use: No   Sexual activity: Not on file  Other Topics Concern   Not on file  Social History Narrative   Not on file   Social Determinants of Health   Financial Resource Strain: Not on file  Food Insecurity: Not on file  Transportation Needs: Not on file  Physical Activity: Not on file  Stress: Not on file  Social Connections: Not on file  Intimate Partner Violence: Not on file    Tobacco Use: Low Risk    Smoking Tobacco Use: Never   Smokeless Tobacco Use: Never   Social History   Substance and Sexual Activity  Alcohol Use No    Family History  Problem Relation Age of Onset   Heart attack Father    Breast cancer Neg Hx     ROS: Constitutional: no fever, no chills, no night sweats, no significant weight loss Cardiovascular: no chest pain, no palpitations Respiratory: no cough, no shortness of breath, No COPD Gastrointestinal: no vomiting, no nausea Musculoskeletal: no swelling in Joints, Joint Pain Neurologic: no numbness, no tingling, no difficulty with balance   Objective:  Physical Exam:  and well developed.  General: Alert and oriented x3, cooperative and pleasant, no acute distress.  Head: normocephalic, atraumatic, neck supple.  Eyes: EOMI.  Respiratory: breath sounds clear in all fields, no wheezing, rales, or rhonchi. Cardiovascular: Regular rate and rhythm, no murmurs, gallops or rubs.  Abdomen: non-tender to palpation and soft, normoactive bowel sounds. Musculoskeletal:   The patient has a significantly antalgic gait pattern, slightly worse on the left than the right. She walks with a trunk flexed forward because of her knee flexion contractures.   Right Hip Exam:  The range of motion: normal without discomfort.   Right Knee Exam:  No effusion present. No swelling present.  The range of motion is: 15 to 110 degrees.  Marked crepitus on range of motion of the knee.  Positive  medial greater than lateral joint line tenderness.  The knee is stable.   Left Knee Exam:  No effusion present. No swelling present.  The Range of motion is: 20 to 120 degrees.  Marked crepitus on range of motion of the knee.  Anteromedial tenderness.  No lateral joint line tenderness.  The knee is stable.  Calves soft and nontender. Motor function intact in LE. Strength 5/5 LE bilaterally. Neuro: Distal pulses 2+. Sensation to light touch intact in LE.   Vital signs in last 24 hours: BP: ()/()  Arterial Line BP: ()/()   Imaging Review  Radiographs- AP and lateral of the bilateral knees dated 06/04/2020 demonstrate bone-on-bone arthritis in the medial and patellofemoral compartments of the bilateral knees with large osteophyte formation and slight varus deformity bilaterally, left side may be slightly worse than the right.  Assessment/Plan:  End stage arthritis, left knee   The patient history, physical examination, clinical judgment of the provider and imaging studies are consistent with end stage degenerative joint disease of the left  knee and total knee arthroplasty is deemed medically necessary. The treatment options including medical management, injection therapy arthroscopy and arthroplasty were discussed at length. The risks and benefits of total knee arthroplasty were presented and reviewed. The risks due to aseptic loosening, infection, stiffness, patella tracking problems, thromboembolic complications and other imponderables were discussed. The patient acknowledged the explanation, agreed to proceed with the plan and consent was signed. Patient is being admitted for inpatient treatment for surgery, pain control, PT, OT, prophylactic antibiotics, VTE prophylaxis, progressive ambulation and ADLs and discharge planning. The patient is planning to be discharged  home .   Patient's anticipated LOS is less than 2 midnights, meeting these requirements: - Lives within 1 hour of  care - Has a competent adult at home to recover with post-op recover - NO history of  - Chronic pain requiring opiods  - Diabetes  - Coronary Artery Disease  - Heart failure  - Heart attack  - Stroke  - DVT/VTE  - Cardiac arrhythmia  - Respiratory Failure/COPD  - Renal failure  - Anemia  - Advanced Liver disease    Therapy Plans: Aurelio Jew, PT at [facility] in [place] Disposition: Home with friend  Planned DVT Prophylaxis: Xarelto 10mg  DME Needed: , 3-in-1, Ice Machine PCP: Dan Humphreys, MD (contacting for clearance) TXA: IV Allergies: Codeine (N/D/V), has experienced some itching with bandages before Anesthesia Concerns: None BMI: 34.9 Last HgbA1c: 5.6  Pharmacy: [facility] [address] in [place]  Other: Contracted MRSA in February  Has Stage 3 CKD -- GFR dropped to 37 at one point, and Nephrology thought it was related to Spironolactone use.   - Patient was instructed on what medications to stop prior to surgery. - Follow-up visit in 2 weeks with Dr. March - Begin physical therapy following surgery - Pre-operative lab work as pre-surgical testing - Prescriptions will be provided in hospital at time of discharge  Lequita Halt, [facility], PA-C Orthopedic Surgery [facility] [place]

## 2020-08-07 ENCOUNTER — Other Ambulatory Visit (HOSPITAL_COMMUNITY)
Admission: RE | Admit: 2020-08-07 | Discharge: 2020-08-07 | Disposition: A | Discharge status: Home / Self Care | Payer: Medicare Other | Source: Ambulatory Visit | Attending: Orthopedic Surgery | Admitting: Orthopedic Surgery

## 2020-08-07 DIAGNOSIS — Z20822 Contact with and (suspected) exposure to covid-19: Secondary | ICD-10-CM | POA: Insufficient documentation

## 2020-08-07 DIAGNOSIS — Z01812 Encounter for preprocedural laboratory examination: Secondary | ICD-10-CM | POA: Insufficient documentation

## 2020-08-07 LAB — SARS CORONAVIRUS 2 (TAT 6-24 HRS): SARS Coronavirus 2: NEGATIVE

## 2020-08-10 MED ORDER — BUPIVACAINE LIPOSOME 1.3 % IJ SUSP
20.0000 mL | INTRAMUSCULAR | Status: DC
  Filled 2020-08-10: qty 20

## 2020-08-11 ENCOUNTER — Ambulatory Visit (HOSPITAL_COMMUNITY): Payer: Medicare Other | Admitting: Physician Assistant

## 2020-08-11 ENCOUNTER — Encounter (HOSPITAL_COMMUNITY)
Admission: RE | Disposition: A | Discharge status: Home / Self Care | Payer: Self-pay | Source: Home / Self Care | Attending: Orthopedic Surgery

## 2020-08-11 ENCOUNTER — Inpatient Hospital Stay (HOSPITAL_COMMUNITY)
Admission: RE | Admit: 2020-08-11 | Discharge: 2020-08-14 | DRG: 470 | Disposition: A | Discharge status: Home / Self Care | Payer: Medicare Other | Attending: Orthopedic Surgery | Admitting: Orthopedic Surgery

## 2020-08-11 ENCOUNTER — Ambulatory Visit (HOSPITAL_COMMUNITY): Payer: Medicare Other | Admitting: Anesthesiology

## 2020-08-11 ENCOUNTER — Encounter (HOSPITAL_COMMUNITY): Payer: Self-pay | Admitting: Orthopedic Surgery

## 2020-08-11 DIAGNOSIS — Z79899 Other long term (current) drug therapy: Secondary | ICD-10-CM

## 2020-08-11 DIAGNOSIS — E669 Obesity, unspecified: Secondary | ICD-10-CM | POA: Diagnosis present

## 2020-08-11 DIAGNOSIS — M1712 Unilateral primary osteoarthritis, left knee: Principal | ICD-10-CM | POA: Diagnosis present

## 2020-08-11 DIAGNOSIS — R7303 Prediabetes: Secondary | ICD-10-CM | POA: Diagnosis present

## 2020-08-11 DIAGNOSIS — M1710 Unilateral primary osteoarthritis, unspecified knee: Secondary | ICD-10-CM

## 2020-08-11 DIAGNOSIS — M179 Osteoarthritis of knee, unspecified: Secondary | ICD-10-CM | POA: Diagnosis present

## 2020-08-11 DIAGNOSIS — E782 Mixed hyperlipidemia: Secondary | ICD-10-CM | POA: Diagnosis present

## 2020-08-11 DIAGNOSIS — I4510 Unspecified right bundle-branch block: Secondary | ICD-10-CM | POA: Diagnosis present

## 2020-08-11 DIAGNOSIS — N183 Chronic kidney disease, stage 3 unspecified: Secondary | ICD-10-CM | POA: Diagnosis present

## 2020-08-11 DIAGNOSIS — Z6834 Body mass index (BMI) 34.0-34.9, adult: Secondary | ICD-10-CM

## 2020-08-11 DIAGNOSIS — Z20822 Contact with and (suspected) exposure to covid-19: Secondary | ICD-10-CM | POA: Diagnosis present

## 2020-08-11 DIAGNOSIS — R339 Retention of urine, unspecified: Secondary | ICD-10-CM | POA: Diagnosis present

## 2020-08-11 DIAGNOSIS — I129 Hypertensive chronic kidney disease with stage 1 through stage 4 chronic kidney disease, or unspecified chronic kidney disease: Secondary | ICD-10-CM | POA: Diagnosis present

## 2020-08-11 HISTORY — PX: TOTAL KNEE ARTHROPLASTY: SHX125

## 2020-08-11 LAB — GLUCOSE, CAPILLARY: Glucose-Capillary: 153 mg/dL — ABNORMAL HIGH (ref 70–99)

## 2020-08-11 SURGERY — ARTHROPLASTY, KNEE, TOTAL
Anesthesia: Spinal | Site: Knee | Laterality: Left

## 2020-08-11 MED ORDER — SODIUM CHLORIDE 0.9 % IR SOLN
Status: DC | PRN
  Administered 2020-08-11: 1000 mL

## 2020-08-11 MED ORDER — CARVEDILOL 6.25 MG PO TABS
6.2500 mg | ORAL_TABLET | Freq: Two times a day (BID) | ORAL | Status: DC
  Administered 2020-08-11 – 2020-08-14 (×5): 6.25 mg via ORAL
  Filled 2020-08-11 (×6): qty 1

## 2020-08-11 MED ORDER — ORAL CARE MOUTH RINSE: 15.0000 mL | Freq: Once | OROMUCOSAL | Status: AC

## 2020-08-11 MED ORDER — PROPOFOL 500 MG/50ML IV EMUL
INTRAVENOUS | Status: DC | PRN
  Administered 2020-08-11 (×3): 20 mg via INTRAVENOUS

## 2020-08-11 MED ORDER — BISACODYL 10 MG RE SUPP: 10.0000 mg | Freq: Every day | RECTAL | Status: DC | PRN

## 2020-08-11 MED ORDER — LACTATED RINGERS IV SOLN: INTRAVENOUS | Status: DC

## 2020-08-11 MED ORDER — CEFAZOLIN SODIUM-DEXTROSE 2-4 GM/100ML-% IV SOLN
2.0000 g | Freq: Four times a day (QID) | INTRAVENOUS | Status: AC
  Administered 2020-08-11: 2 g via INTRAVENOUS
  Filled 2020-08-11: qty 100

## 2020-08-11 MED ORDER — DEXAMETHASONE SODIUM PHOSPHATE 10 MG/ML IJ SOLN: 8.0000 mg | Freq: Once | INTRAMUSCULAR | Status: DC

## 2020-08-11 MED ORDER — TRAMADOL HCL 50 MG PO TABS
50.0000 mg | ORAL_TABLET | Freq: Four times a day (QID) | ORAL | Status: DC | PRN
Start: 2020-08-11 — End: 2020-08-14
  Administered 2020-08-12 (×2): 50 mg via ORAL
  Filled 2020-08-11 (×2): qty 1

## 2020-08-11 MED ORDER — LOSARTAN POTASSIUM-HCTZ 100-12.5 MG PO TABS: 1.0000 | ORAL_TABLET | Freq: Every morning | ORAL | Status: DC

## 2020-08-11 MED ORDER — BUPIVACAINE-EPINEPHRINE (PF) 0.5% -1:200000 IJ SOLN
INTRAMUSCULAR | Status: DC | PRN
  Administered 2020-08-11: 20 mL via PERINEURAL

## 2020-08-11 MED ORDER — METHOCARBAMOL 500 MG IVPB - SIMPLE MED
500.0000 mg | Freq: Four times a day (QID) | INTRAVENOUS | Status: DC | PRN
  Filled 2020-08-11: qty 50

## 2020-08-11 MED ORDER — MENTHOL 3 MG MT LOZG: 1.0000 | LOZENGE | OROMUCOSAL | Status: DC | PRN

## 2020-08-11 MED ORDER — PROPOFOL 10 MG/ML IV BOLUS
INTRAVENOUS | Status: AC
  Filled 2020-08-11: qty 20

## 2020-08-11 MED ORDER — FLEET ENEMA 7-19 GM/118ML RE ENEM: 1.0000 | ENEMA | Freq: Once | RECTAL | Status: DC | PRN

## 2020-08-11 MED ORDER — ACETAMINOPHEN 10 MG/ML IV SOLN
1000.0000 mg | Freq: Four times a day (QID) | INTRAVENOUS | Status: DC
  Administered 2020-08-11: 1000 mg via INTRAVENOUS
  Filled 2020-08-11: qty 100

## 2020-08-11 MED ORDER — SODIUM CHLORIDE 0.9 % IV SOLN: INTRAVENOUS | Status: DC

## 2020-08-11 MED ORDER — LATANOPROST 0.005 % OP SOLN
1.0000 [drp] | Freq: Every day | OPHTHALMIC | Status: DC
  Administered 2020-08-11 – 2020-08-13 (×3): 1 [drp] via OPHTHALMIC
  Filled 2020-08-11: qty 2.5

## 2020-08-11 MED ORDER — ONDANSETRON HCL 4 MG/2ML IJ SOLN
INTRAMUSCULAR | Status: AC
  Filled 2020-08-11: qty 2

## 2020-08-11 MED ORDER — DEXAMETHASONE SODIUM PHOSPHATE 10 MG/ML IJ SOLN
10.0000 mg | Freq: Once | INTRAMUSCULAR | Status: AC
  Administered 2020-08-12: 10 mg via INTRAVENOUS
  Filled 2020-08-11: qty 1

## 2020-08-11 MED ORDER — ONDANSETRON HCL 4 MG PO TABS: 4.0000 mg | ORAL_TABLET | Freq: Four times a day (QID) | ORAL | Status: DC | PRN

## 2020-08-11 MED ORDER — METHOCARBAMOL 500 MG PO TABS
500.0000 mg | ORAL_TABLET | Freq: Four times a day (QID) | ORAL | Status: DC | PRN
  Administered 2020-08-11 – 2020-08-13 (×3): 500 mg via ORAL
  Filled 2020-08-11 (×3): qty 1

## 2020-08-11 MED ORDER — SODIUM CHLORIDE 0.9 % IV SOLN
2.0000 g | INTRAVENOUS | Status: AC
  Administered 2020-08-11: 2 g via INTRAVENOUS
  Filled 2020-08-11: qty 2

## 2020-08-11 MED ORDER — POLYETHYLENE GLYCOL 3350 17 G PO PACK: 17.0000 g | PACK | Freq: Every day | ORAL | Status: DC | PRN

## 2020-08-11 MED ORDER — CHLORHEXIDINE GLUCONATE 0.12 % MT SOLN
15.0000 mL | Freq: Once | OROMUCOSAL | Status: AC
  Administered 2020-08-11: 15 mL via OROMUCOSAL

## 2020-08-11 MED ORDER — HYDROMORPHONE HCL 2 MG PO TABS: 2.0000 mg | ORAL_TABLET | ORAL | Status: DC | PRN

## 2020-08-11 MED ORDER — MIDAZOLAM HCL 2 MG/2ML IJ SOLN
2.0000 mg | Freq: Once | INTRAMUSCULAR | Status: DC
  Filled 2020-08-11: qty 2

## 2020-08-11 MED ORDER — CLONIDINE HCL (ANALGESIA) 100 MCG/ML EP SOLN
EPIDURAL | Status: DC | PRN
  Administered 2020-08-11: 70 ug

## 2020-08-11 MED ORDER — BUPIVACAINE LIPOSOME 1.3 % IJ SUSP
INTRAMUSCULAR | Status: DC | PRN
  Administered 2020-08-11: 20 mL

## 2020-08-11 MED ORDER — PHENOL 1.4 % MT LIQD: 1.0000 | OROMUCOSAL | Status: DC | PRN

## 2020-08-11 MED ORDER — LOSARTAN POTASSIUM 50 MG PO TABS
100.0000 mg | ORAL_TABLET | Freq: Every day | ORAL | Status: DC
  Administered 2020-08-13 – 2020-08-14 (×2): 100 mg via ORAL
  Filled 2020-08-11 (×2): qty 2

## 2020-08-11 MED ORDER — MORPHINE SULFATE (PF) 2 MG/ML IV SOLN: 0.5000 mg | INTRAVENOUS | Status: DC | PRN

## 2020-08-11 MED ORDER — SODIUM CHLORIDE (PF) 0.9 % IJ SOLN
INTRAMUSCULAR | Status: DC | PRN
  Administered 2020-08-11: 60 mL

## 2020-08-11 MED ORDER — BUPIVACAINE IN DEXTROSE 0.75-8.25 % IT SOLN
INTRATHECAL | Status: DC | PRN
  Administered 2020-08-11: 1.5 mL via INTRATHECAL

## 2020-08-11 MED ORDER — ALBUMIN HUMAN 5 % IV SOLN
INTRAVENOUS | Status: AC
  Filled 2020-08-11: qty 250

## 2020-08-11 MED ORDER — METOCLOPRAMIDE HCL 5 MG/ML IJ SOLN: 5.0000 mg | Freq: Three times a day (TID) | INTRAMUSCULAR | Status: DC | PRN

## 2020-08-11 MED ORDER — SODIUM CHLORIDE (PF) 0.9 % IJ SOLN
INTRAMUSCULAR | Status: AC
  Filled 2020-08-11: qty 10

## 2020-08-11 MED ORDER — HYDROCHLOROTHIAZIDE 12.5 MG PO CAPS
12.5000 mg | ORAL_CAPSULE | Freq: Every day | ORAL | Status: DC
  Administered 2020-08-13 – 2020-08-14 (×2): 12.5 mg via ORAL
  Filled 2020-08-11 (×3): qty 1

## 2020-08-11 MED ORDER — AMLODIPINE BESYLATE 5 MG PO TABS
5.0000 mg | ORAL_TABLET | Freq: Every day | ORAL | Status: DC
  Administered 2020-08-13 – 2020-08-14 (×2): 5 mg via ORAL
  Filled 2020-08-11 (×3): qty 1

## 2020-08-11 MED ORDER — DOCUSATE SODIUM 100 MG PO CAPS
100.0000 mg | ORAL_CAPSULE | Freq: Two times a day (BID) | ORAL | Status: DC
  Administered 2020-08-11 – 2020-08-14 (×6): 100 mg via ORAL
  Filled 2020-08-11 (×6): qty 1

## 2020-08-11 MED ORDER — METOCLOPRAMIDE HCL 5 MG PO TABS: 5.0000 mg | ORAL_TABLET | Freq: Three times a day (TID) | ORAL | Status: DC | PRN

## 2020-08-11 MED ORDER — POVIDONE-IODINE 10 % EX SWAB
2.0000 "application " | Freq: Once | CUTANEOUS | Status: AC
  Administered 2020-08-11: 2 via TOPICAL

## 2020-08-11 MED ORDER — SODIUM CHLORIDE 0.9 % IV SOLN
2.0000 g | Freq: Four times a day (QID) | INTRAVENOUS | Status: DC
Start: 2020-08-11 — End: 2020-08-11
  Filled 2020-08-11: qty 2

## 2020-08-11 MED ORDER — GABAPENTIN 300 MG PO CAPS
300.0000 mg | ORAL_CAPSULE | Freq: Three times a day (TID) | ORAL | Status: DC
Start: 2020-08-11 — End: 2020-08-14
  Administered 2020-08-11 – 2020-08-14 (×8): 300 mg via ORAL
  Filled 2020-08-11 (×8): qty 1

## 2020-08-11 MED ORDER — DEXAMETHASONE SODIUM PHOSPHATE 4 MG/ML IJ SOLN
INTRAMUSCULAR | Status: DC | PRN
  Administered 2020-08-11: 5 mg via PERINEURAL

## 2020-08-11 MED ORDER — ONDANSETRON HCL 4 MG/2ML IJ SOLN: 4.0000 mg | Freq: Four times a day (QID) | INTRAMUSCULAR | Status: DC | PRN

## 2020-08-11 MED ORDER — ATORVASTATIN CALCIUM 20 MG PO TABS
20.0000 mg | ORAL_TABLET | Freq: Every evening | ORAL | Status: DC
  Administered 2020-08-12 – 2020-08-13 (×2): 20 mg via ORAL
  Filled 2020-08-11 (×2): qty 1

## 2020-08-11 MED ORDER — GLYCOPYRROLATE 0.2 MG/ML IJ SOLN
INTRAMUSCULAR | Status: DC | PRN
  Administered 2020-08-11: .2 mg via INTRAVENOUS

## 2020-08-11 MED ORDER — TRANEXAMIC ACID-NACL 1000-0.7 MG/100ML-% IV SOLN
1000.0000 mg | INTRAVENOUS | Status: AC
  Administered 2020-08-11: 1000 mg via INTRAVENOUS
  Filled 2020-08-11: qty 100

## 2020-08-11 MED ORDER — PROPOFOL 500 MG/50ML IV EMUL
INTRAVENOUS | Status: DC | PRN
  Administered 2020-08-11: 100 ug/kg/min via INTRAVENOUS

## 2020-08-11 MED ORDER — STERILE WATER FOR IRRIGATION IR SOLN
Status: DC | PRN
  Administered 2020-08-11: 2000 mL

## 2020-08-11 MED ORDER — TIMOLOL MALEATE 0.5 % OP SOLN
1.0000 [drp] | Freq: Every day | OPHTHALMIC | Status: DC
  Administered 2020-08-12 – 2020-08-14 (×3): 1 [drp] via OPHTHALMIC
  Filled 2020-08-11: qty 5

## 2020-08-11 MED ORDER — CEFAZOLIN SODIUM-DEXTROSE 2-4 GM/100ML-% IV SOLN
2.0000 g | Freq: Four times a day (QID) | INTRAVENOUS | Status: DC
  Administered 2020-08-11: 2 g via INTRAVENOUS
  Filled 2020-08-11 (×2): qty 100

## 2020-08-11 MED ORDER — ACETAMINOPHEN 500 MG PO TABS
1000.0000 mg | ORAL_TABLET | Freq: Four times a day (QID) | ORAL | Status: AC
  Administered 2020-08-11 – 2020-08-12 (×4): 1000 mg via ORAL
  Filled 2020-08-11 (×4): qty 2

## 2020-08-11 MED ORDER — APIXABAN 2.5 MG PO TABS
2.5000 mg | ORAL_TABLET | Freq: Two times a day (BID) | ORAL | Status: DC
  Administered 2020-08-12 – 2020-08-14 (×5): 2.5 mg via ORAL
  Filled 2020-08-11 (×5): qty 1

## 2020-08-11 MED ORDER — FENTANYL CITRATE (PF) 100 MCG/2ML IJ SOLN
100.0000 ug | Freq: Once | INTRAMUSCULAR | Status: AC
  Administered 2020-08-11: 50 ug via INTRAVENOUS
  Filled 2020-08-11: qty 2

## 2020-08-11 MED ORDER — DIPHENHYDRAMINE HCL 12.5 MG/5ML PO ELIX: 12.5000 mg | ORAL_SOLUTION | ORAL | Status: DC | PRN

## 2020-08-11 SURGICAL SUPPLY — 54 items
ATTUNE PSFEM LTSZ6 NARCEM KNEE (Femur) ×2 IMPLANT
ATTUNE PSRP INSR SZ6 8 KNEE (Insert) ×2 IMPLANT
BAG COUNTER SPONGE SURGICOUNT (BAG) IMPLANT
BAG ZIPLOCK 12X15 (MISCELLANEOUS) ×2 IMPLANT
BASE TIBIAL ROT PLAT SZ 5 KNEE (Knees) ×1 IMPLANT
BLADE SAG 18X100X1.27 (BLADE) ×2 IMPLANT
BLADE SAW SGTL 11.0X1.19X90.0M (BLADE) ×2 IMPLANT
BNDG ELASTIC 6X10 VLCR STRL LF (GAUZE/BANDAGES/DRESSINGS) ×2 IMPLANT
BNDG ELASTIC 6X5.8 VLCR STR LF (GAUZE/BANDAGES/DRESSINGS) ×2 IMPLANT
BOWL SMART MIX CTS (DISPOSABLE) ×2 IMPLANT
CEMENT HV SMART SET (Cement) ×4 IMPLANT
COVER SURGICAL LIGHT HANDLE (MISCELLANEOUS) ×2 IMPLANT
CUFF TOURN SGL QUICK 34 (TOURNIQUET CUFF) ×2
CUFF TRNQT CYL 34X4.125X (TOURNIQUET CUFF) ×1 IMPLANT
DECANTER SPIKE VIAL GLASS SM (MISCELLANEOUS) ×2 IMPLANT
DRAPE U-SHAPE 47X51 STRL (DRAPES) ×2 IMPLANT
DRSG AQUACEL AG ADV 3.5X10 (GAUZE/BANDAGES/DRESSINGS) ×2 IMPLANT
DURAPREP 26ML APPLICATOR (WOUND CARE) ×2 IMPLANT
ELECT REM PT RETURN 15FT ADLT (MISCELLANEOUS) ×2 IMPLANT
GLOVE SRG 8 PF TXTR STRL LF DI (GLOVE) ×1 IMPLANT
GLOVE SURG ENC MOIS LTX SZ6.5 (GLOVE) ×2 IMPLANT
GLOVE SURG ENC MOIS LTX SZ8 (GLOVE) ×4 IMPLANT
GLOVE SURG UNDER POLY LF SZ7 (GLOVE) ×2 IMPLANT
GLOVE SURG UNDER POLY LF SZ8 (GLOVE) ×2
GLOVE SURG UNDER POLY LF SZ8.5 (GLOVE) ×2 IMPLANT
GOWN STRL REUS W/TWL LRG LVL3 (GOWN DISPOSABLE) ×4 IMPLANT
GOWN STRL REUS W/TWL XL LVL3 (GOWN DISPOSABLE) ×2 IMPLANT
HANDPIECE INTERPULSE COAX TIP (DISPOSABLE) ×2
HOLDER FOLEY CATH W/STRAP (MISCELLANEOUS) IMPLANT
IMMOBILIZER KNEE 20 (SOFTGOODS) ×2
IMMOBILIZER KNEE 20 THIGH 36 (SOFTGOODS) ×1 IMPLANT
KIT TURNOVER KIT A (KITS) ×2 IMPLANT
MANIFOLD NEPTUNE II (INSTRUMENTS) ×2 IMPLANT
NS IRRIG 1000ML POUR BTL (IV SOLUTION) ×2 IMPLANT
PACK TOTAL KNEE CUSTOM (KITS) ×2 IMPLANT
PADDING CAST COTTON 6X4 STRL (CAST SUPPLIES) ×2 IMPLANT
PATELLA MEDIAL ATTUN 35MM KNEE (Knees) ×2 IMPLANT
PENCIL SMOKE EVACUATOR (MISCELLANEOUS) ×2 IMPLANT
PIN DRILL FIX HALF THREAD (BIT) ×2 IMPLANT
PIN STEINMAN FIXATION KNEE (PIN) ×2 IMPLANT
PROTECTOR NERVE ULNAR (MISCELLANEOUS) ×2 IMPLANT
SET HNDPC FAN SPRY TIP SCT (DISPOSABLE) ×1 IMPLANT
STRIP CLOSURE SKIN 1/2X4 (GAUZE/BANDAGES/DRESSINGS) ×4 IMPLANT
SUT MNCRL AB 4-0 PS2 18 (SUTURE) ×2 IMPLANT
SUT STRATAFIX 0 PDS 27 VIOLET (SUTURE) ×2
SUT VIC AB 2-0 CT1 27 (SUTURE) ×6
SUT VIC AB 2-0 CT1 TAPERPNT 27 (SUTURE) ×3 IMPLANT
SUTURE STRATFX 0 PDS 27 VIOLET (SUTURE) ×1 IMPLANT
TAPE STRIPS DRAPE STRL (GAUZE/BANDAGES/DRESSINGS) ×2 IMPLANT
TIBIAL BASE ROT PLAT SZ 5 KNEE (Knees) ×2 IMPLANT
TRAY FOLEY MTR SLVR 16FR STAT (SET/KITS/TRAYS/PACK) ×2 IMPLANT
TUBE SUCTION HIGH CAP CLEAR NV (SUCTIONS) ×2 IMPLANT
WATER STERILE IRR 1000ML POUR (IV SOLUTION) ×4 IMPLANT
WRAP KNEE MAXI GEL POST OP (GAUZE/BANDAGES/DRESSINGS) ×2 IMPLANT

## 2020-08-11 NOTE — Anesthesia Postprocedure Evaluation (Signed)
Anesthesia Post Note  Patient: Joanne Lewis  Procedure(s) Performed: TOTAL KNEE ARTHROPLASTY (Left: Knee)     Patient location during evaluation: PACU Anesthesia Type: Spinal Level of consciousness: awake and alert Pain management: pain level controlled Vital Signs Assessment: post-procedure vital signs reviewed and stable Respiratory status: spontaneous breathing Cardiovascular status: stable Anesthetic complications: no   No notable events documented.  Last Vitals:  Vitals:   08/11/20 1400 08/11/20 1415  BP: 121/66 (!) 135/55  Pulse: (!) 55 (!) 58  Resp: 10 16  Temp:    SpO2: 99% 97%    Last Pain:  Vitals:   08/11/20 1415  TempSrc:   PainSc: 1                  Lewie Loron

## 2020-08-11 NOTE — Op Note (Signed)
OPERATIVE REPORT-TOTAL KNEE ARTHROPLASTY   Pre-operative diagnosis- Osteoarthritis  Left knee(s)  Post-operative diagnosis- Osteoarthritis Left knee(s)  Procedure-  Left  Total Knee Arthroplasty  Surgeon- Gus Rankin. Toa Mia, MD  Assistant- Joanne Able, PA-C   Anesthesia-   Adductor canal block and spinal  EBL-50 mL   Drains None  Tourniquet time-  Total Tourniquet Time Documented: Thigh (Left) - 36 minutes Total: Thigh (Left) - 36 minutes     Complications- None  Condition-PACU - hemodynamically stable.   Brief Clinical Note  Joanne Lewis is a 75 y.o. year old female with end stage OA of her left knee with progressively worsening pain and dysfunction. She has constant pain, with activity and at rest and significant functional deficits with difficulties even with ADLs. She has had extensive non-op management including analgesics, injections of cortisone and viscosupplements, and home exercise program, but remains in significant pain with significant dysfunction. Radiographs show bone on bone arthritis medial and patellofemoral. She presents now for left Total Knee Arthroplasty.     Procedure in detail---   The patient is brought into the operating room and positioned supine on the operating table. After successful administration of  Adductor canal block and spinal,   a tourniquet is placed high on the  Left thigh(s) and the lower extremity is prepped and draped in the usual sterile fashion. Time out is performed by the operating team and then the  Left lower extremity is wrapped in Esmarch, knee flexed and the tourniquet inflated to 300 mmHg.       A midline incision is made with a ten blade through the subcutaneous tissue to the level of the extensor mechanism. A fresh blade is used to make a medial parapatellar arthrotomy. Soft tissue over the proximal medial tibia is subperiosteally elevated to the joint line with a knife and into the semimembranosus bursa with a Cobb  elevator. Soft tissue over the proximal lateral tibia is elevated with attention being paid to avoiding the patellar tendon on the tibial tubercle. The patella is everted, knee flexed 90 degrees and the ACL and PCL are removed. Findings are bone on bone medial and patellofemoral with massive global osteophytes.        The drill is used to create a starting hole in the distal femur and the canal is thoroughly irrigated with sterile saline to remove the fatty contents. The 5 degree Left  valgus alignment guide is placed into the femoral canal and the distal femoral cutting block is pinned to remove 9 mm off the distal femur. Resection is made with an oscillating saw.      The tibia is subluxed forward and the menisci are removed. The extramedullary alignment guide is placed referencing proximally at the medial aspect of the tibial tubercle and distally along the second metatarsal axis and tibial crest. The block is pinned to remove 67mm off the more deficient medial  side. Resection is made with an oscillating saw. Size 5is the most appropriate size for the tibia and the proximal tibia is prepared with the modular drill and keel punch for that size.      The femoral sizing guide is placed and size 6 is most appropriate. Rotation is marked off the epicondylar axis and confirmed by creating a rectangular flexion gap at 90 degrees. The size 6 cutting block is pinned in this rotation and the anterior, posterior and chamfer cuts are made with the oscillating saw. The intercondylar block is then placed and that cut is  made.      Trial size 5 tibial component, trial size 6 narrow posterior stabilized femur and a 8  mm posterior stabilized rotating platform insert trial is placed. Full extension is achieved with excellent varus/valgus and anterior/posterior balance throughout full range of motion. The patella is everted and thickness measured to be 22  mm. Free hand resection is taken to 12 mm, a 35 template is placed, lug  holes are drilled, trial patella is placed, and it tracks normally. Osteophytes are removed off the posterior femur with the trial in place. All trials are removed and the cut bone surfaces prepared with pulsatile lavage. Cement is mixed and once ready for implantation, the size 5 tibial implant, size  6 narrow posterior stabilized femoral component, and the size 35 patella are cemented in place and the patella is held with the clamp. The trial insert is placed and the knee held in full extension. The Exparel (20 ml mixed with 60 ml saline) is injected into the extensor mechanism, posterior capsule, medial and lateral gutters and subcutaneous tissues.  All extruded cement is removed and once the cement is hard the permanent 8 mm posterior stabilized rotating platform insert is placed into the tibial tray.      The wound is copiously irrigated with saline solution and the extensor mechanism closed with # 0 Stratofix suture. The tourniquet is released for a total tourniquet time of 36  minutes. Flexion against gravity is 140 degrees and the patella tracks normally. Subcutaneous tissue is closed with 2.0 vicryl and subcuticular with running 4.0 Monocryl. The incision is cleaned and dried and steri-strips and a bulky sterile dressing are applied. The limb is placed into a knee immobilizer and the patient is awakened and transported to recovery in stable condition.      Please note that a surgical assistant was a medical necessity for this procedure in order to perform it in a safe and expeditious manner. Surgical assistant was necessary to retract the ligaments and vital neurovascular structures to prevent injury to them and also necessary for proper positioning of the limb to allow for anatomic placement of the prosthesis.   [name], MD    08/11/2020, 12:34 PM

## 2020-08-11 NOTE — Anesthesia Procedure Notes (Signed)
Spinal  Patient location during procedure: OR Start time: 08/11/2020 11:28 AM End time: 08/11/2020 11:32 AM Reason for block: surgical anesthesia Staffing Performed: anesthesiologist  Anesthesiologist: Nolon Nations, MD Preanesthetic Checklist Completed: patient identified, IV checked, site marked, risks and benefits discussed, surgical consent, monitors and equipment checked, pre-op evaluation and timeout performed Spinal Block Patient position: sitting Prep: DuraPrep and site prepped and draped Patient monitoring: heart rate, continuous pulse ox and blood pressure Approach: right paramedian Location: L3-4 Injection technique: single-shot Needle Needle type: Spinocan  Needle gauge: 25 G Needle length: 9 cm Additional Notes Expiration date of kit checked and confirmed. Patient tolerated procedure well, without complications.

## 2020-08-11 NOTE — Anesthesia Procedure Notes (Addendum)
Anesthesia Regional Block: Adductor canal block   Pre-Anesthetic Checklist: , timeout performed,  Correct Patient, Correct Site, Correct Laterality,  Correct Procedure, Correct Position, site marked,  Risks and benefits discussed,  Surgical consent,  Pre-op evaluation,  At surgeon's request and post-op pain management  Laterality: Left and Lower  Prep: chloraprep       Needles:  Injection technique: Single-shot  Needle Type: Stimulator Needle - 40     Needle Length: 4cm  Needle Gauge: 22     Additional Needles:   Procedures:,,,, ultrasound used (permanent image in chart),,    Narrative:  Start time: 08/11/2020 10:35 AM End time: 08/11/2020 10:50 AM Injection made incrementally with aspirations every 5 mL.  Performed by: Personally  Anesthesiologist: Lewie Loron, MD  Additional Notes: BP cuff, SpO2 and EKG monitors applied. Sedation begun. Nerve location verified with ultrasound. Anesthetic injected incrementally, slowly, and after neg aspirations under direct u/s guidance. Good perineural spread. Tolerated well.

## 2020-08-11 NOTE — Progress Notes (Signed)
AssistedDr. Germeroth with left, ultrasound guided, adductor canal block. Side rails up, monitors on throughout procedure. See vital signs in flow sheet. Tolerated Procedure well.  

## 2020-08-11 NOTE — Anesthesia Procedure Notes (Signed)
Procedure Name: MAC Date/Time: 08/11/2020 11:40 AM Performed by: Jari Pigg, CRNA Pre-anesthesia Checklist: Patient identified, Emergency Drugs available, Suction available and Patient being monitored Patient Re-evaluated:Patient Re-evaluated prior to induction Oxygen Delivery Method: Simple face mask

## 2020-08-11 NOTE — Progress Notes (Signed)
Orthopedic Tech Progress Note Patient Details:  Joanne Lewis [facility] 10/09/1945 [id number]  CPM Left Knee CPM Left Knee: On Left Knee Flexion (Degrees): 40 Left Knee Extension (Degrees): 10  Post Interventions Patient Tolerated: Well  Joanne Lewis Joanne Lewis 08/11/2020, 1:14 PM

## 2020-08-11 NOTE — Discharge Instructions (Addendum)
Joanne Gross, MD Total Joint Specialist [facility] [address] [place], [place] [address] [phone]  TOTAL KNEE REPLACEMENT POSTOPERATIVE DIRECTIONS    Knee Rehabilitation, Guidelines Following Surgery  Results after knee surgery are often greatly improved when you follow the exercise, range of motion and muscle strengthening exercises prescribed by your doctor. Safety measures are also important to protect the knee from further injury. If any of these exercises cause you to have increased pain or swelling in your knee joint, decrease the amount until you are comfortable again and slowly increase them. If you have problems or questions, call your caregiver or physical therapist for advice.   BLOOD CLOT PREVENTION Take a 2.5 mg Eliquis twice a day for three weeks following surgery. Then take an 81 mg Aspirin once a day for three weeks. Then discontinue Aspirin. You may resume your vitamins/supplements once you have discontinued the Xarelto. Do not take any NSAIDs (Advil, Aleve, Ibuprofen, Meloxicam, etc.) until you have discontinued the Xarelto.   HOME CARE INSTRUCTIONS  Remove items at home which could result in a fall. This includes throw rugs or furniture in walking pathways.  ICE to the affected knee as much as tolerated. Icing helps control swelling. If the swelling is well controlled you will be more comfortable and rehab easier. Continue to use ice on the knee for pain and swelling from surgery. You may notice swelling that will progress down to the foot and ankle. This is normal after surgery. Elevate the leg when you are not up walking on it.    Continue to use the breathing machine which will help keep your temperature down. It is common for your temperature to cycle up and down following surgery, especially at night when you are not up moving around and exerting yourself. The breathing machine keeps your lungs expanded and your temperature  down. Do not place pillow under the operative knee, focus on keeping the knee straight while resting  DIET You may resume your previous home diet once you are discharged from the hospital.  DRESSING / WOUND CARE / SHOWERING Keep your bulky bandage on for 2 days. On the third post-operative day you may remove the Ace bandage and gauze. There is a waterproof adhesive bandage on your skin which will stay in place until your first follow-up appointment. Once you remove this you will not need to place another bandage You may begin showering 3 days following surgery, but do not submerge the incision under water.  ACTIVITY For the first 5 days, the key is rest and control of pain and swelling Do your home exercises twice a day starting on post-operative day 3. On the days you go to physical therapy, just do the home exercises once that day. You should rest, ice and elevate the leg for 50 minutes out of every hour. Get up and walk/stretch for 10 minutes per hour. After 5 days you can increase your activity slowly as tolerated. Walk with your walker as instructed. Use the walker until you are comfortable transitioning to a cane. Walk with the cane in the opposite hand of the operative leg. You may discontinue the cane once you are comfortable and walking steadily. Avoid periods of inactivity such as sitting longer than an hour when not asleep. This helps prevent blood clots.  You may discontinue the knee immobilizer once you are able to perform a straight leg raise while lying down. You may resume a sexual relationship in one month or  when given the OK by your doctor.  You may return to work once you are cleared by your doctor.  Do not drive a car for 6 weeks or until released by your surgeon.  Do not drive while taking narcotics.  TED HOSE STOCKINGS Wear the elastic stockings on both legs for three weeks following surgery during the day. You may remove them at night for sleeping.  WEIGHT  BEARING Weight bearing as tolerated with assist device (walker, cane, etc) as directed, use it as long as suggested by your surgeon or therapist, typically at least 4-6 weeks.  POSTOPERATIVE CONSTIPATION PROTOCOL Constipation - defined medically as fewer than three stools per week and severe constipation as less than one stool per week.  One of the most common issues patients have following surgery is constipation.  Even if you have a regular bowel pattern at home, your normal regimen is likely to be disrupted due to multiple reasons following surgery.  Combination of anesthesia, postoperative narcotics, change in appetite and fluid intake all can affect your bowels.  In order to avoid complications following surgery, here are some recommendations in order to help you during your recovery period.  Colace (docusate) - Pick up an over-the-counter form of Colace or another stool softener and take twice a day as long as you are requiring postoperative pain medications.  Take with a full glass of water daily.  If you experience loose stools or diarrhea, hold the colace until you stool forms back up. If your symptoms do not get better within 1 week or if they get worse, check with your doctor. Dulcolax (bisacodyl) - Pick up over-the-counter and take as directed by the product packaging as needed to assist with the movement of your bowels.  Take with a full glass of water.  Use this product as needed if not relieved by Colace only.  MiraLax (polyethylene glycol) - Pick up over-the-counter to have on hand. MiraLax is a solution that will increase the amount of water in your bowels to assist with bowel movements.  Take as directed and can mix with a glass of water, juice, soda, coffee, or tea. Take if you go more than two days without a movement. Do not use MiraLax more than once per day. Call your doctor if you are still constipated or irregular after using this medication for 7 days in a row.  If you continue  to have problems with postoperative constipation, please contact the office for further assistance and recommendations.  If you experience "the worst abdominal pain ever" or develop nausea or vomiting, please contact the office immediatly for further recommendations for treatment.  ITCHING If you experience itching with your medications, try taking only a single pain pill, or even half a pain pill at a time.  You can also use Benadryl over the counter for itching or also to help with sleep.   MEDICATIONS See your medication summary on the "After Visit Summary" that the nursing staff will review with you prior to discharge.  You may have some home medications which will be placed on hold until you complete the course of blood thinner medication.  It is important for you to complete the blood thinner medication as prescribed by your surgeon.  Continue your approved medications as instructed at time of discharge.  PRECAUTIONS If you experience chest pain or shortness of breath - call 911 immediately for transfer to the hospital emergency department.  If you develop a fever greater that 101 F, purulent  drainage from wound, increased redness or drainage from wound, foul odor from the wound/dressing, or calf pain - CONTACT YOUR SURGEON.                                                   FOLLOW-UP APPOINTMENTS Make sure you keep all of your appointments after your operation with your surgeon and caregivers. You should call the office at the above phone number and make an appointment for approximately two weeks after the date of your surgery or on the date instructed by your surgeon outlined in the "After Visit Summary".  RANGE OF MOTION AND STRENGTHENING EXERCISES  Rehabilitation of the knee is important following a knee injury or an operation. After just a few days of immobilization, the muscles of the thigh which control the knee become weakened and shrink (atrophy). Knee exercises are designed to build up  the tone and strength of the thigh muscles and to improve knee motion. Often times heat used for twenty to thirty minutes before working out will loosen up your tissues and help with improving the range of motion but do not use heat for the first two weeks following surgery. These exercises can be done on a training (exercise) mat, on the floor, on a table or on a bed. Use what ever works the best and is most comfortable for you Knee exercises include:  Leg Lifts - While your knee is still immobilized in a splint or cast, you can do straight leg raises. Lift the leg to 60 degrees, hold for 3 sec, and slowly lower the leg. Repeat 10-20 times 2-3 times daily. Perform this exercise against resistance later as your knee gets better.  Quad and Hamstring Sets - Tighten up the muscle on the front of the thigh (Quad) and hold for 5-10 sec. Repeat this 10-20 times hourly. Hamstring sets are done by pushing the foot backward against an object and holding for 5-10 sec. Repeat as with quad sets.  Leg Slides: Lying on your back, slowly slide your foot toward your buttocks, bending your knee up off the floor (only go as far as is comfortable). Then slowly slide your foot back down until your leg is flat on the floor again. Angel Wings: Lying on your back spread your legs to the side as far apart as you can without causing discomfort.  A rehabilitation program following serious knee injuries can speed recovery and prevent re-injury in the future due to weakened muscles. Contact your doctor or a physical therapist for more information on knee rehabilitation.   POST-OPERATIVE OPIOID TAPER INSTRUCTIONS: It is important to wean off of your opioid medication as soon as possible. If you do not need pain medication after your surgery it is ok to stop day one. Opioids include: Codeine, Hydrocodone(Norco, Vicodin), Oxycodone(Percocet, oxycontin) and hydromorphone amongst others.  Long term and even short term use of opiods can  cause: Increased pain response Dependence Constipation Depression Respiratory depression And more.  Withdrawal symptoms can include Flu like symptoms Nausea, vomiting And more Techniques to manage these symptoms Hydrate well Eat regular healthy meals Stay active Use relaxation techniques(deep breathing, meditating, yoga) Do Not substitute Alcohol to help with tapering If you have been on opioids for less than two weeks and do not have pain than it is ok to stop all together.  Plan to   wean off of opioids This plan should start within one week post op of your joint replacement. Maintain the same interval or time between taking each dose and first decrease the dose.  Cut the total daily intake of opioids by one tablet each day Next start to increase the time between doses. The last dose that should be eliminated is the evening dose.   IF YOU ARE TRANSFERRED TO A SKILLED REHAB FACILITY If the patient is transferred to a skilled rehab facility following release from the hospital, a list of the current medications will be sent to the facility for the patient to continue.  When discharged from the skilled rehab facility, please have the facility set up the patient's Home Health Physical Therapy prior to being released. Also, the skilled facility will be responsible for providing the patient with their medications at time of release from the facility to include their pain medication, the muscle relaxants, and their blood thinner medication. If the patient is still at the rehab facility at time of the two week follow up appointment, the skilled rehab facility will also need to assist the patient in arranging follow up appointment in our office and any transportation needs.  MAKE SURE YOU:  Understand these instructions.  Get help right away if you are not doing well or get worse.   DENTAL ANTIBIOTICS:  In most cases prophylactic antibiotics for Dental procdeures after total joint surgery are  not necessary.  Exceptions are as follows:  1. History of prior total joint infection  2. Severely immunocompromised (Organ Transplant, cancer chemotherapy, Rheumatoid biologic meds such as Humera)  3. Poorly controlled diabetes (A1C &gt; 8.0, blood glucose over 200)  If you have one of these conditions, contact your surgeon for an antibiotic prescription, prior to your dental procedure.    Pick up stool softner and laxative for home use following surgery while on pain medications. Do not submerge incision under water. Please use good hand washing techniques while changing dressing each day. May shower starting three days after surgery. Please use a clean towel to pat the incision dry following showers. Continue to use ice for pain and swelling after surgery. Do not use any lotions or creams on the incision until instructed by your surgeon.  Information on my medicine - ELIQUIS (apixaban)  This medication education was reviewed with me or my healthcare representative as part of my discharge preparation.  The pharmacist that spoke with me during my hospital stay was:    Why was Eliquis prescribed for you? Eliquis was prescribed for you to reduce the risk of blood clots forming after orthopedic surgery.    What do You need to know about Eliquis? Take your Eliquis TWICE DAILY - one tablet in the morning and one tablet in the evening with or without food.  It would be best to take the dose about the same time each day.  If you have difficulty swallowing the tablet whole please discuss with your pharmacist how to take the medication safely.  Take Eliquis exactly as prescribed by your doctor and DO NOT stop taking Eliquis without talking to the doctor who prescribed the medication.  Stopping without other medication to take the place of Eliquis may increase your risk of developing a clot.  After discharge, you should have regular check-up appointments with your healthcare  provider that is prescribing your Eliquis.  What do you do if you miss a dose? If a dose of ELIQUIS is not taken at the scheduled  time, take it as soon as possible on the same day and twice-daily administration should be resumed.  The dose should not be doubled to make up for a missed dose.  Do not take more than one tablet of ELIQUIS at the same time.  Important Safety Information A possible side effect of Eliquis is bleeding. You should call your healthcare provider right away if you experience any of the following: Bleeding from an injury or your nose that does not stop. Unusual colored urine (red or dark brown) or unusual colored stools (red or black). Unusual bruising for unknown reasons. A serious fall or if you hit your head (even if there is no bleeding).  Some medicines may interact with Eliquis and might increase your risk of bleeding or clotting while on Eliquis. To help avoid this, consult your healthcare provider or pharmacist prior to using any new prescription or non-prescription medications, including herbals, vitamins, non-steroidal anti-inflammatory drugs (NSAIDs) and supplements.  This website has more information on Eliquis (apixaban): http://www.eliquis.com/eliquis/home

## 2020-08-11 NOTE — Anesthesia Preprocedure Evaluation (Addendum)
Anesthesia Evaluation  Patient identified by MRN, date of birth, ID band Patient awake    Reviewed: Allergy & Precautions, NPO status , Patient's Chart, lab work & pertinent test results  Airway Mallampati: II  TM Distance: >3 FB Neck ROM: Full    Dental  (+) Dental Advisory Given, Teeth Intact   Pulmonary pneumonia,    Pulmonary exam normal breath sounds clear to auscultation       Cardiovascular hypertension, Pt. on medications and Pt. 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  Reproductive/Obstetrics                            Anesthesia Physical  Anesthesia Plan  ASA: 3  Anesthesia Plan: Spinal   Post-op Pain Management:  Regional for Post-op pain   Induction: Intravenous  PONV Risk Score and Plan: Ondansetron, Treatment may vary due to age or medical condition, Dexamethasone and Propofol infusion  Airway Management Planned: Natural Airway  Additional Equipment: None  Intra-op Plan:   Post-operative Plan:   Informed Consent: I have reviewed the patients History and Physical, chart, labs and discussed the procedure including the risks, benefits and alternatives for the proposed anesthesia with the patient or authorized representative who has indicated his/her understanding and acceptance.     Dental advisory given  Plan Discussed with: CRNA  Anesthesia Plan Comments:        Anesthesia Quick Evaluation

## 2020-08-11 NOTE — Transfer of Care (Signed)
Immediate Anesthesia Transfer of Care Note  Patient: Joanne Lewis  Procedure(s) Performed: TOTAL KNEE ARTHROPLASTY (Left: Knee)  Patient Location: PACU  Anesthesia Type:Spinal  Level of Consciousness: awake, alert  and oriented  Airway & Oxygen Therapy: Patient Spontanous Breathing  Post-op Assessment: Report given to RN and Post -op Vital signs reviewed and stable  Post vital signs: Reviewed and stable  Last Vitals:  Vitals Value Taken Time  BP 107/76 08/11/20 1304  Temp    Pulse 62 08/11/20 1306  Resp 13 08/11/20 1306  SpO2 98 % 08/11/20 1306  Vitals shown include unvalidated device data.  Last Pain:  Vitals:   08/11/20 0922  TempSrc:   PainSc: 0-No pain      Patients Stated Pain Goal: 3 (08/11/20 1505)  Complications: No notable events documented.

## 2020-08-11 NOTE — Interval H&P Note (Signed)
History and Physical Interval Note:  08/11/2020 9:38 AM  [place]  has presented today for surgery, with the diagnosis of Left knee osteoarthritis.  The various methods of treatment have been discussed with the patient and family. After consideration of risks, benefits and other options for treatment, the patient has consented to  Procedure(s): TOTAL KNEE ARTHROPLASTY (Left) as a surgical intervention.  The patient's history has been reviewed, patient examined, no change in status, stable for surgery.  I have reviewed the patient's chart and labs.  Questions were answered to the patient's satisfaction.     Homero Fellers Noal Abshier

## 2020-08-12 LAB — CBC
HCT: 34.1 % — ABNORMAL LOW (ref 36.0–46.0)
Hemoglobin: 11.8 g/dL — ABNORMAL LOW (ref 12.0–15.0)
MCH: 30.6 pg (ref 26.0–34.0)
MCHC: 34.6 g/dL (ref 30.0–36.0)
MCV: 88.6 fL (ref 80.0–100.0)
Platelets: 159 10*3/uL (ref 150–400)
RBC: 3.85 MIL/uL — ABNORMAL LOW (ref 3.87–5.11)
RDW: 12.2 % (ref 11.5–15.5)
WBC: 10.3 10*3/uL (ref 4.0–10.5)
nRBC: 0 % (ref 0.0–0.2)

## 2020-08-12 LAB — BASIC METABOLIC PANEL
Anion gap: 7 (ref 5–15)
BUN: 14 mg/dL (ref 8–23)
CO2: 23 mmol/L (ref 22–32)
Calcium: 8.3 mg/dL — ABNORMAL LOW (ref 8.9–10.3)
Chloride: 100 mmol/L (ref 98–111)
Creatinine, Ser: 1.03 mg/dL — ABNORMAL HIGH (ref 0.44–1.00)
GFR, Estimated: 57 mL/min — ABNORMAL LOW (ref >60)
Glucose, Bld: 122 mg/dL — ABNORMAL HIGH (ref 70–99)
Potassium: 4 mmol/L (ref 3.5–5.1)
Sodium: 130 mmol/L — ABNORMAL LOW (ref 135–145)

## 2020-08-12 MED ORDER — HYDROMORPHONE HCL 2 MG PO TABS: 2.0000 mg | ORAL_TABLET | Freq: Four times a day (QID) | ORAL | 0 refills | Status: DC | PRN

## 2020-08-12 MED ORDER — GABAPENTIN 300 MG PO CAPS: ORAL_CAPSULE | ORAL | 0 refills | Status: DC

## 2020-08-12 MED ORDER — APIXABAN 2.5 MG PO TABS: 2.5000 mg | ORAL_TABLET | Freq: Two times a day (BID) | ORAL | 0 refills | Status: DC

## 2020-08-12 MED ORDER — METHOCARBAMOL 500 MG PO TABS: 500.0000 mg | ORAL_TABLET | Freq: Four times a day (QID) | ORAL | 0 refills | Status: DC | PRN

## 2020-08-12 MED ORDER — TRAMADOL HCL 50 MG PO TABS: 50.0000 mg | ORAL_TABLET | Freq: Four times a day (QID) | ORAL | 0 refills | Status: DC | PRN

## 2020-08-12 NOTE — Evaluation (Signed)
Physical Therapy Evaluation Patient Details Name: Joanne Lewis MRN: [id number] DOB: February 16, 1945 Today's Date: 08/12/2020   History of Present Illness  75  YO female S/P L TKA,PMH: HTN, fatigue, hernia, CKD.  Clinical Impression  The patient looking forward to mobilizing, expressing that she was apprehensive. Very attentive to instructions for There ex and IS and gait sequence..  Patient ambulated x 51' with reports of feeling hot and dizzy. BP after seated: 96/35, HR 49, patient pale.  After resting, BP 94/61 HR 47. RN notified.  Patient pleased with her  progress so far and minimal discomfort of the knee.   Continue PT this PM, monitor  VS and determine if able to meet PT goals for DC.  Marland KitchenPt admitted with above diagnosis.   Pt currently with functional limitations due to the deficits listed below (see PT Problem List). Pt will benefit from skilled PT to increase their independence and safety with mobility to allow discharge to the venue listed below.        Follow Up Recommendations Follow surgeon's recommendation for DC plan and follow-up therapies (OPPT)    Equipment Recommendations  Rolling walker with 5" wheels;3in1 (PT)    Recommendations for Other Services       Precautions / Restrictions Precautions Precautions: Fall;Knee Required Braces or Orthoses: Knee Immobilizer - Left Knee Immobilizer - Left: Discontinue once straight leg raise with < 10 degree lag      Mobility  Bed Mobility Overal bed mobility: Needs Assistance Bed Mobility: Supine to Sit     Supine to sit: HOB elevated;Min guard     General bed mobility comments: cues for technique    Transfers Overall transfer level: Needs assistance Equipment used: Rolling walker (2 wheeled) Transfers: Sit to/from Stand Sit to Stand: Min assist         General transfer comment: cues for had and LLE placement  Ambulation/Gait Ambulation/Gait assistance: Min assist Gait Distance (Feet): 40 Feet Assistive  device: Rolling walker (2 wheeled) Gait Pattern/deviations: Step-to pattern;Antalgic     General Gait Details: cues for sequence. Patient reporting feeling hot. Palor and lightheaded when returned to room and sat down. BP 96/35, HR 49  Stairs            Wheelchair Mobility    Modified Rankin (Stroke Patients Only)       Balance Overall balance assessment: Needs assistance Sitting-balance support: No upper extremity supported;Feet supported Sitting balance-Leahy Scale: Good     Standing balance support: During functional activity;No upper extremity supported Standing balance-Leahy Scale: Fair Standing balance comment: to pull up panties                             Pertinent Vitals/Pain Pain Assessment: 0-10 Pain Score: 1  Pain Location: left knee Pain Descriptors / Indicators: Discomfort Pain Intervention(s): Monitored during session;Ice applied    Home Living Family/patient expects to be discharged to:: Private residence Living Arrangements: Alone;Non-relatives/Friends Available Help at Discharge: Friend(s);Available 24 hours/day Type of Home: House Home Access: Stairs to enter Entrance Stairs-Rails: None (has something to bear weight on) Entrance Stairs-Number of Steps: 3 Home Layout: One level Home Equipment: None      Prior Function Level of Independence: Independent               Hand Dominance   Dominant Hand: Right    Extremity/Trunk Assessment   Upper Extremity Assessment Upper Extremity Assessment: Overall WFL for tasks assessed  Lower Extremity Assessment Lower Extremity Assessment: LLE deficits/detail LLE Deficits / Details: SLR +, knee flexion 5-60       Communication   Communication: No difficulties  Cognition Arousal/Alertness: Awake/alert Behavior During Therapy: WFL for tasks assessed/performed Overall Cognitive Status: Within Functional Limits for tasks assessed                                         General Comments      Exercises Total Joint Exercises Ankle Circles/Pumps: AROM;Both;10 reps Quad Sets: AROM;Both;10 reps Heel Slides: AAROM;Left;10 reps;Supine Hip ABduction/ADduction: AROM;Left;10 reps;Supine Straight Leg Raises: AROM;Left;10 reps;Supine   Assessment/Plan    PT Assessment Patient needs continued PT services  PT Problem List Decreased strength;Decreased knowledge of precautions;Decreased range of motion;Decreased mobility;Decreased knowledge of use of DME;Cardiopulmonary status limiting activity;Decreased activity tolerance;Decreased safety awareness       PT Treatment Interventions DME instruction;Therapeutic activities;Gait training;Therapeutic exercise;Patient/family education;Stair training;Functional mobility training    PT Goals (Current goals can be found in the Care Plan section)  Acute Rehab PT Goals Patient Stated Goal: do my best PT Goal Formulation: With patient Time For Goal Achievement: 08/26/20 Potential to Achieve Goals: Good    Frequency 7X/week   Barriers to discharge        Co-evaluation               AM-PAC PT "6 Clicks" Mobility  Outcome Measure Help needed turning from your back to your side while in a flat bed without using bedrails?: A Little Help needed moving from lying on your back to sitting on the side of a flat bed without using bedrails?: A Little Help needed moving to and from a bed to a chair (including a wheelchair)?: A Little Help needed standing up from a chair using your arms (e.g., wheelchair or bedside chair)?: A Little Help needed to walk in hospital room?: A Little Help needed climbing 3-5 steps with a railing? : A Lot 6 Click Score: 17    End of Session Equipment Utilized During Treatment: Gait belt Activity Tolerance: Patient tolerated treatment well;Treatment limited secondary to medical complications (Comment) (hypotension) Patient left: in chair;with call bell/phone within reach Nurse  Communication: Mobility status PT Visit Diagnosis: Unsteadiness on feet (R26.81);Difficulty in walking, not elsewhere classified (R26.2)    Time: 7322-0254 PT Time Calculation (min) (ACUTE ONLY): 46 min   Charges:   PT Evaluation $PT Eval Low Complexity: 1 Low PT Treatments $Gait Training: 8-22 mins $Therapeutic Exercise: 8-22 mins        Blanchard Kelch PT Acute Rehabilitation Services Pager [phone] Office [phone]   Rada Hay 08/12/2020, 9:37 AM

## 2020-08-12 NOTE — Progress Notes (Signed)
   Subjective: 1 Day Post-Op Procedure(s) (LRB): TOTAL KNEE ARTHROPLASTY (Left) Patient reports pain as mild.   Patient seen in rounds by Dr. Lequita Halt. Patient is well, and has had no acute complaints or problems. Denies chest pain or SOB. No issues overnight. Foley catheter removed this AM. We will begin therapy today.   Objective: Vital signs in last 24 hours: Temp:  [97.5 F (36.4 C)-98.3 F (36.8 C)] 98.3 F (36.8 C) (08/02 0611) Pulse Rate:  [52-86] 58 (08/02 0611) Resp:  [7-21] 16 (08/02 0611) BP: (96-155)/(55-84) 125/58 (08/02 0611) SpO2:  [96 %-100 %] 97 % (08/02 0611)  Intake/Output from previous day:  Intake/Output Summary (Last 24 hours) at 08/12/2020 0707 Last data filed at 08/11/2020 2101 Gross per 24 hour  Intake 1158.75 ml  Output 1150 ml  Net 8.75 ml     Intake/Output this shift: No intake/output data recorded.  Labs: Recent Labs    08/12/20 0401  HGB 11.8*   Recent Labs    08/12/20 0401  WBC 10.3  RBC 3.85*  HCT 34.1*  PLT 159   Recent Labs    08/12/20 0401  NA 130*  K 4.0  CL 100  CO2 23  BUN 14  CREATININE 1.03*  GLUCOSE 122*  CALCIUM 8.3*   No results for input(s): LABPT, INR in the last 72 hours.  Exam: General - Patient is Alert and Oriented Extremity - Neurologically intact Neurovascular intact Sensation intact distally Dorsiflexion/Plantar flexion intact Dressing - dressing C/D/I Motor Function - intact, moving foot and toes well on exam.   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   Weight as of 07/29/20: 87.5 kg. Advance diet Up with therapy D/C IV fluids   Patient's anticipated LOS is less than 2 midnights, meeting these requirements: - Younger than 32 - Lives within 1 hour of care - Has a competent adult at home to recover with post-op recover - NO history of  - Chronic pain requiring opiods  - Diabetes  - Coronary Artery Disease  - Heart failure  - Heart attack  - Stroke  - DVT/VTE  - Cardiac arrhythmia  - Respiratory Failure/COPD  - Renal failure  - Anemia  - Advanced Liver disease  DVT Prophylaxis -  Eliquis Weight bearing as tolerated. Begin therapy.  Plan is to go Home after hospital stay. Plan for discharge later today if progresses with therapy and meeting her goals. Scheduled for OPPT Follow-up in the office in 2 weeks  The PDMP database was reviewed today prior to any opioid medications being prescribed to this patient.  77, PA-C Orthopedic Surgery [phone] 08/12/2020, 7:07 AM

## 2020-08-12 NOTE — TOC Transition Note (Signed)
Transition of Care [facility]) - CM/SW Discharge Note   Patient Details  Name: Joanne Lewis MRN: [id number] Date of Birth: 12-18-1945  [facility] CM/SW Contact:  Lennart Pall, LCSW Phone Number: 08/12/2020, 12:58 PM   Clinical Narrative:    Met with pt and confirming receipt of DME via [facility].  Plan for OPPT at [facility] in [place].  No further TOC.   Final next level of care: OP Rehab Barriers to Discharge: No Barriers Identified   Patient Goals and CMS Choice Patient states their goals for this hospitalization and ongoing recovery are:: return home      Discharge Placement                       Discharge Plan and Services                DME Arranged: 3-N-1, Walker rolling DME Agency: [employer] Date DME Agency Contacted:  (ordered prior to surgery)                Social Determinants of Health (SDOH) Interventions     Readmission Risk Interventions No flowsheet data found.

## 2020-08-12 NOTE — Progress Notes (Signed)
Physical Therapy Treatment Patient Details Name: Joanne Lewis MRN: [id number] DOB: 11/16/1945 Today's Date: 08/12/2020    History of Present Illness 75  YO female S/P L TKA,PMH: HTN, fatigue, hernia, CKD.    PT Comments    The patient has not yet voided. Orthostatic BP's taken, see flow sheet. BP actually increased with standing/activity. Patient ambulated 20' x 2 to BR.  Patient expresses concern for not voiding yet with H/O CKD. RN aware of  no void.  Continue PT.  Follow Up Recommendations  Follow surgeon's recommendation for DC plan and follow-up therapies     Equipment Recommendations  Rolling walker with 5" wheels;3in1 (PT)    Recommendations for Other Services       Precautions / Restrictions Precautions Precautions: Fall;Knee Required Braces or Orthoses: Knee Immobilizer - Left    Mobility  Bed Mobility   Bed Mobility: Sit to Supine       Sit to supine: Supervision   General bed mobility comments: patient able to lift legs onto bed    Transfers Overall transfer level: Needs assistance Equipment used: Rolling walker (2 wheeled) Transfers: Sit to/from Stand Sit to Stand: Min assist         General transfer comment: cues for had and LLE placement from recliner and low toilet  Ambulation/Gait Ambulation/Gait assistance: Min assist Gait Distance (Feet): 20 Feet (x 2) Assistive device: Rolling walker (2 wheeled) Gait Pattern/deviations: Step-to pattern;Antalgic Gait velocity: decr   General Gait Details: cues for sequence.  patient ambuated to  and from BR.   Stairs             Wheelchair Mobility    Modified Rankin (Stroke Patients Only)       Balance Overall balance assessment: Needs assistance Sitting-balance support: No upper extremity supported;Feet supported Sitting balance-Leahy Scale: Good     Standing balance support: During functional activity;No upper extremity supported Standing balance-Leahy Scale: Fair Standing  balance comment: to pull up panties                            Cognition Arousal/Alertness: Awake/alert                                     General Comments: expresses concern that she has not voided      Exercises      General Comments        Pertinent Vitals/Pain Pain Score: 2  Pain Location: left knee Pain Descriptors / Indicators: Discomfort Pain Intervention(s): Monitored during session;Premedicated before session;Ice applied    Home Living                      Prior Function            PT Goals (current goals can now be found in the care plan section) Progress towards PT goals: Progressing toward goals    Frequency    7X/week      PT Plan Current plan remains appropriate    Co-evaluation              AM-PAC PT "6 Clicks" Mobility   Outcome Measure  Help needed turning from your back to your side while in a flat bed without using bedrails?: A Little Help needed moving from lying on your back to sitting on the side of a flat bed without using bedrails?:  A Little Help needed moving to and from a bed to a chair (including a wheelchair)?: A Little Help needed standing up from a chair using your arms (e.g., wheelchair or bedside chair)?: A Little Help needed to walk in hospital room?: A Little Help needed climbing 3-5 steps with a railing? : A Lot 6 Click Score: 17    End of Session Equipment Utilized During Treatment: Gait belt Activity Tolerance: Patient limited by fatigue Patient left: with call bell/phone within reach;in bed;with bed alarm set Nurse Communication: Mobility status PT Visit Diagnosis: Unsteadiness on feet (R26.81);Difficulty in walking, not elsewhere classified (R26.2)     Time: 9373-4287 PT Time Calculation (min) (ACUTE ONLY): 110 min  Charges:  $Gait Training: 8-22 mins $Therapeutic Activity: 8-22 mins $Self Care/Home Management: 8-22                     Blanchard Kelch PT Acute  Rehabilitation Services Pager [phone] Office [phone]    Rada Hay 08/12/2020, 3:39 PM

## 2020-08-13 ENCOUNTER — Other Ambulatory Visit: Payer: Self-pay

## 2020-08-13 ENCOUNTER — Encounter (HOSPITAL_COMMUNITY): Payer: Self-pay | Admitting: Orthopedic Surgery

## 2020-08-13 DIAGNOSIS — I129 Hypertensive chronic kidney disease with stage 1 through stage 4 chronic kidney disease, or unspecified chronic kidney disease: Secondary | ICD-10-CM | POA: Diagnosis present

## 2020-08-13 DIAGNOSIS — Z79899 Other long term (current) drug therapy: Secondary | ICD-10-CM | POA: Diagnosis not present

## 2020-08-13 DIAGNOSIS — I4510 Unspecified right bundle-branch block: Secondary | ICD-10-CM | POA: Diagnosis present

## 2020-08-13 DIAGNOSIS — R339 Retention of urine, unspecified: Secondary | ICD-10-CM | POA: Diagnosis present

## 2020-08-13 DIAGNOSIS — M1712 Unilateral primary osteoarthritis, left knee: Secondary | ICD-10-CM | POA: Diagnosis present

## 2020-08-13 DIAGNOSIS — E669 Obesity, unspecified: Secondary | ICD-10-CM | POA: Diagnosis present

## 2020-08-13 DIAGNOSIS — Z20822 Contact with and (suspected) exposure to covid-19: Secondary | ICD-10-CM | POA: Diagnosis present

## 2020-08-13 DIAGNOSIS — M179 Osteoarthritis of knee, unspecified: Secondary | ICD-10-CM | POA: Diagnosis present

## 2020-08-13 DIAGNOSIS — Z6834 Body mass index (BMI) 34.0-34.9, adult: Secondary | ICD-10-CM | POA: Diagnosis not present

## 2020-08-13 DIAGNOSIS — N183 Chronic kidney disease, stage 3 unspecified: Secondary | ICD-10-CM | POA: Diagnosis present

## 2020-08-13 DIAGNOSIS — E782 Mixed hyperlipidemia: Secondary | ICD-10-CM | POA: Diagnosis present

## 2020-08-13 DIAGNOSIS — R7303 Prediabetes: Secondary | ICD-10-CM | POA: Diagnosis present

## 2020-08-13 DIAGNOSIS — M1710 Unilateral primary osteoarthritis, unspecified knee: Secondary | ICD-10-CM | POA: Diagnosis present

## 2020-08-13 LAB — CBC
HCT: 32.8 % — ABNORMAL LOW (ref 36.0–46.0)
Hemoglobin: 11.4 g/dL — ABNORMAL LOW (ref 12.0–15.0)
MCH: 30.3 pg (ref 26.0–34.0)
MCHC: 34.8 g/dL (ref 30.0–36.0)
MCV: 87.2 fL (ref 80.0–100.0)
Platelets: 167 10*3/uL (ref 150–400)
RBC: 3.76 MIL/uL — ABNORMAL LOW (ref 3.87–5.11)
RDW: 12.2 % (ref 11.5–15.5)
WBC: 10.7 10*3/uL — ABNORMAL HIGH (ref 4.0–10.5)
nRBC: 0 % (ref 0.0–0.2)

## 2020-08-13 LAB — BASIC METABOLIC PANEL
Anion gap: 8 (ref 5–15)
BUN: 18 mg/dL (ref 8–23)
CO2: 23 mmol/L (ref 22–32)
Calcium: 8.5 mg/dL — ABNORMAL LOW (ref 8.9–10.3)
Chloride: 96 mmol/L — ABNORMAL LOW (ref 98–111)
Creatinine, Ser: 0.89 mg/dL (ref 0.44–1.00)
GFR, Estimated: >60 mL/min (ref >60)
Glucose, Bld: 140 mg/dL — ABNORMAL HIGH (ref 70–99)
Potassium: 4.3 mmol/L (ref 3.5–5.1)
Sodium: 127 mmol/L — ABNORMAL LOW (ref 135–145)

## 2020-08-13 MED ORDER — SODIUM CHLORIDE 0.9 % IV BOLUS
500.0000 mL | Freq: Once | INTRAVENOUS | Status: AC
  Administered 2020-08-13: 500 mL via INTRAVENOUS

## 2020-08-13 MED ORDER — SODIUM CHLORIDE 0.9 % IV SOLN: INTRAVENOUS | Status: DC

## 2020-08-13 NOTE — Progress Notes (Signed)
PA contacted in regards to orthostatic state after ambulating with PT. Bolus ordered.

## 2020-08-13 NOTE — Progress Notes (Signed)
Physical Therapy Treatment Patient Details Name: Joanne Lewis MRN: [id number] DOB: 29-Nov-1945 Today's Date: 08/13/2020    History of Present Illness 75  YO female S/P L TKA,PMH: HTN, fatigue, hernia, CKD.    PT Comments    Patient reports eager to go home home. Patient ambulated  up/down 2 steps with min assist, simulating her steps with no rail. Patient ambulated to BR  where she reported feeling dizzy and feelings of passing out.   Patient improved, BP  95/86. Patient ambulated to recliner.  BP sitting upright 91/52. Taken again 80/59, 84/39,legs then elevated -BP 84/68. RN notified.  [id number]. Patient had BP meds at 1030ish. , Na+ 127.  Patient had periods of dizziness while sitting upright in recliner.  Will check back in ~ 1-2 hours, monitor  BP and ambulate.  Follow Up Recommendations  Follow surgeon's recommendation for DC plan and follow-up therapies     Equipment Recommendations  Rolling walker with 5" wheels;3in1 (PT)    Recommendations for Other Services       Precautions / Restrictions Precautions Precautions: Fall;Knee Knee Immobilizer - Left: Discontinue once straight leg raise with < 10 degree lag Restrictions Weight Bearing Restrictions: No    Mobility  Bed Mobility Overal bed mobility: Modified Independent                  Transfers   Equipment used: Rolling walker (2 wheeled) Transfers: Sit to/from Stand Sit to Stand: Supervision         General transfer comment: cues for had and LLE placement from bed and low toilet  Ambulation/Gait Ambulation/Gait assistance: Min guard Gait Distance (Feet): 30 Feet (then 20) Assistive device: Rolling walker (2 wheeled) Gait Pattern/deviations: Step-to pattern;Step-through pattern     General Gait Details: cues for sequence.  patient ambuated to  and from BR.   Stairs Stairs: Yes Stairs assistance: Min guard   Number of Stairs: 2 General stair comments: patient  simulated weight on mailbox  at bottom odf steps and a plantar, then placed RW in front on angle to use instead.   Wheelchair Mobility    Modified Rankin (Stroke Patients Only)       Balance     Sitting balance-Leahy Scale: Good     Standing balance support: During functional activity;No upper extremity supported Standing balance-Leahy Scale: Fair                              Cognition Arousal/Alertness: Awake/alert                                            Exercises Total Joint Exercises Long Arc Quad: AROM;15 reps;Both;Seated Knee Flexion: AROM;10 reps;Left Goniometric ROM: 5-70 left knee    General Comments        Pertinent Vitals/Pain Pain Score: 2  Pain Location: left knee Pain Descriptors / Indicators: Discomfort Pain Intervention(s): Monitored during session;Ice applied;Premedicated before session    Home Living Family/patient expects to be discharged to:: Private residence Living Arrangements: Alone;Non-relatives/Friends                  Prior Function            PT Goals (current goals can now be found in the care plan section) Progress towards PT goals: Progressing toward goals    Frequency  7X/week      PT Plan Current plan remains appropriate    Co-evaluation              AM-PAC PT "6 Clicks" Mobility   Outcome Measure  Help needed turning from your back to your side while in a flat bed without using bedrails?: None Help needed moving from lying on your back to sitting on the side of a flat bed without using bedrails?: None Help needed moving to and from a bed to a chair (including a wheelchair)?: A Little Help needed standing up from a chair using your arms (e.g., wheelchair or bedside chair)?: A Little Help needed to walk in hospital room?: A Little Help needed climbing 3-5 steps with a railing? : A Little 6 Click Score: 20    End of Session   Activity Tolerance: Treatment limited secondary to medical  complications (Comment) (hypotensive) Patient left: in chair;with call bell/phone within reach Nurse Communication: Mobility status PT Visit Diagnosis: Unsteadiness on feet (R26.81);Difficulty in walking, not elsewhere classified (R26.2)     Time: 9937-1696 PT Time Calculation (min) (ACUTE ONLY): 60 min  Charges:  $Gait Training: 8-22 mins $Therapeutic Exercise: 8-22 mins $Therapeutic Activity: 8-22 mins $Self Care/Home Management: 8-22                     Blanchard Kelch PT Acute Rehabilitation Services Pager [phone] Office [phone]   Rada Hay 08/13/2020, 11:56 AM

## 2020-08-13 NOTE — Plan of Care (Signed)
  Problem: Clinical Measurements: Goal: Ability to maintain clinical measurements within normal limits will improve Outcome: Progressing   

## 2020-08-13 NOTE — Progress Notes (Signed)
   Subjective: 2 Days Post-Op Procedure(s) (LRB): TOTAL KNEE ARTHROPLASTY (Left) Patient reports pain as mild.   Patient seen in rounds by Dr. Lequita Halt. Patient is well, and has had no acute complaints or problems. Was not meeting goals with physical therapy yesterday. Had issues earlier in the day also with urinary retention, but this resolved by the afternoon and patient has been voiding without difficulty. Denies chest pain or SOB. Plan is to go Home after hospital stay.  Objective: Vital signs in last 24 hours: Temp:  [97.6 F (36.4 C)-98.2 F (36.8 C)] 98.2 F (36.8 C) (08/03 0510) Pulse Rate:  [51-66] 56 (08/03 0510) Resp:  [16-18] 16 (08/03 0510) BP: (97-152)/(50-62) 134/60 (08/03 0510) SpO2:  [97 %-100 %] 97 % (08/03 0510)  Intake/Output from previous day:  Intake/Output Summary (Last 24 hours) at 08/13/2020 0802 Last data filed at 08/13/2020 0600 Gross per 24 hour  Intake 640 ml  Output 2650 ml  Net -2010 ml    Intake/Output this shift: No intake/output data recorded.  Labs: Recent Labs    08/12/20 0401 08/13/20 0409  HGB 11.8* 11.4*   Recent Labs    08/12/20 0401 08/13/20 0409  WBC 10.3 10.7*  RBC 3.85* 3.76*  HCT 34.1* 32.8*  PLT 159 167   Recent Labs    08/12/20 0401 08/13/20 0409  NA 130* 127*  K 4.0 4.3  CL 100 96*  CO2 23 23  BUN 14 18  CREATININE 1.03* 0.89  GLUCOSE 122* 140*  CALCIUM 8.3* 8.5*   No results for input(s): LABPT, INR in the last 72 hours.  Exam: General - Patient is Alert and Oriented Extremity - Neurologically intact Neurovascular intact Sensation intact distally Dorsiflexion/Plantar flexion intact Dressing/Incision - clean, dry, no drainage Motor Function - intact, moving foot and toes well on exam.   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HCC)    Arthritis    Branch retinal artery occlusion, left eye    Chronic kidney disease    moderate   Fatigue    Femoral hernia of right side with  obstruction and without gangrene 11/25/2017   Hyperkalemia    Hyperlipidemia    Hypertension    Pneumonia    at least 10 years ago   Pre-diabetes     Assessment/Plan: 2 Days Post-Op Procedure(s) (LRB): TOTAL KNEE ARTHROPLASTY (Left) Principal Problem:   OA (osteoarthritis) of knee Active Problems:   Primary osteoarthritis of left knee  Estimated body mass index is 34.19 kg/m as calculated from the following:   Height as of 07/29/20: 5\' 3"  (1.6 m).   Weight as of 07/29/20: 87.5 kg. Up with therapy  DVT Prophylaxis -  Eliquis Weight-bearing as tolerated  Plan for discharge later today once cleared by PT  07/31/20, PA-C Orthopedic Surgery [phone] 08/13/2020, 8:02 AM

## 2020-08-13 NOTE — Progress Notes (Signed)
Physical Therapy Treatment Patient Details Name: Joanne Lewis MRN: [id number] DOB: 1945/01/15 Today's Date: 08/13/2020    History of Present Illness 75  YO female S/P L TKA,PMH: HTN, fatigue, hernia, CKD.    PT Comments     Patient  has  received IV fluids.  BP sup105/53 Sit 87/51 Stand 98/60 Post walk 86/65  HR 50-54 Patient tolerated ambulation x 90' with Rw. Did not have dizziness at this time. Patient describes feeling weak.  RN notified of BP.  Continue PT while in hospital.    Follow Up Recommendations  Follow surgeon's recommendation for DC plan and follow-up therapies     Equipment Recommendations  Rolling walker with 5" wheels;3in1 (PT)    Recommendations for Other Services       Precautions / Restrictions Precautions Precautions: Fall;Knee Precaution Comments: hypotensive Knee Immobilizer - Left: Discontinue once straight leg raise with < 10 degree lag    Mobility  Bed Mobility Overal bed mobility: Modified Independent             General bed mobility comments: in recliner    Transfers Overall transfer level: Needs assistance Equipment used: Rolling walker (2 wheeled) Transfers: Sit to/from Stand Sit to Stand: Supervision         General transfer comment: cues for had and LLE placement from bed and low toilet  Ambulation/Gait Ambulation/Gait assistance: Min guard Gait Distance (Feet): 90 Feet Assistive device: Rolling walker (2 wheeled) Gait Pattern/deviations: Step-to pattern;Step-through pattern     General Gait Details: cues for sequence.   Stairs Stairs: Yes Stairs assistance: Min guard   Number of Stairs: 2 General stair comments: patient  simulated weight on mailbox at bottom odf steps and a plantar, then placed RW in front on angle to use instead.   Wheelchair Mobility    Modified Rankin (Stroke Patients Only)       Balance Overall balance assessment: Needs assistance Sitting-balance support: No upper extremity  supported;Feet supported Sitting balance-Leahy Scale: Good     Standing balance support: During functional activity;No upper extremity supported Standing balance-Leahy Scale: Fair                              Cognition Arousal/Alertness: Awake/alert Behavior During Therapy: WFL for tasks assessed/performed                                   General Comments: expresses concern that she does not want to take strong pain meds if BP is low, (has not taken anytoday and pain is controlled)      Exercises Total Joint Exercises Quad Sets: AROM;Both;10 reps Heel Slides: AAROM;Left;10 reps;Supine Hip ABduction/ADduction: AROM;Left;10 reps;Supine Straight Leg Raises: AROM;Left;10 reps;Supine Long Arc Quad: AROM;15 reps;Both;Seated Knee Flexion: AROM;10 reps;Left Goniometric ROM: 5-70 left knee    General Comments        Pertinent Vitals/Pain Pain Score: 2  Pain Location: left knee Pain Descriptors / Indicators: Discomfort Pain Intervention(s): Monitored during session    Home Living                      Prior Function            PT Goals (current goals can now be found in the care plan section) Progress towards PT goals: Progressing toward goals    Frequency    7X/week  PT Plan Current plan remains appropriate    Co-evaluation              AM-PAC PT "6 Clicks" Mobility   Outcome Measure  Help needed turning from your back to your side while in a flat bed without using bedrails?: None Help needed moving from lying on your back to sitting on the side of a flat bed without using bedrails?: None Help needed moving to and from a bed to a chair (including a wheelchair)?: A Little Help needed standing up from a chair using your arms (e.g., wheelchair or bedside chair)?: A Little Help needed to walk in hospital room?: A Little Help needed climbing 3-5 steps with a railing? : A Little 6 Click Score: 20    End of Session  Equipment Utilized During Treatment: Gait belt Activity Tolerance: Patient tolerated treatment well Patient left: in chair;with call bell/phone within reach Nurse Communication: Mobility status PT Visit Diagnosis: Unsteadiness on feet (R26.81);Difficulty in walking, not elsewhere classified (R26.2)     Time: 4431-5400 PT Time Calculation (min) (ACUTE ONLY): 34 min  Charges:  $Gait Training: 8-22 mins $Therapeutic Exercise: 8-22 mins $ Blanchard Kelch PT Acute Rehabilitation Services Pager [phone] Office [phone]    Rada Hay 08/13/2020, 3:19 PM

## 2020-08-14 ENCOUNTER — Ambulatory Visit: Payer: Medicare Other | Admitting: Physical Therapy

## 2020-08-14 LAB — BASIC METABOLIC PANEL
Anion gap: 7 (ref 5–15)
BUN: 22 mg/dL (ref 8–23)
CO2: 25 mmol/L (ref 22–32)
Calcium: 8 mg/dL — ABNORMAL LOW (ref 8.9–10.3)
Chloride: 96 mmol/L — ABNORMAL LOW (ref 98–111)
Creatinine, Ser: 0.87 mg/dL (ref 0.44–1.00)
GFR, Estimated: >60 mL/min (ref >60)
Glucose, Bld: 104 mg/dL — ABNORMAL HIGH (ref 70–99)
Potassium: 3.8 mmol/L (ref 3.5–5.1)
Sodium: 128 mmol/L — ABNORMAL LOW (ref 135–145)

## 2020-08-14 LAB — CBC
HCT: 31.1 % — ABNORMAL LOW (ref 36.0–46.0)
Hemoglobin: 10.5 g/dL — ABNORMAL LOW (ref 12.0–15.0)
MCH: 30.2 pg (ref 26.0–34.0)
MCHC: 33.8 g/dL (ref 30.0–36.0)
MCV: 89.4 fL (ref 80.0–100.0)
Platelets: 169 10*3/uL (ref 150–400)
RBC: 3.48 MIL/uL — ABNORMAL LOW (ref 3.87–5.11)
RDW: 12.6 % (ref 11.5–15.5)
WBC: 8.8 10*3/uL (ref 4.0–10.5)
nRBC: 0 % (ref 0.0–0.2)

## 2020-08-14 MED ORDER — BISACODYL 10 MG RE SUPP
10.0000 mg | Freq: Once | RECTAL | Status: AC
  Administered 2020-08-14: 10 mg via RECTAL
  Filled 2020-08-14: qty 1

## 2020-08-14 NOTE — Progress Notes (Signed)
   Subjective: 3 Days Post-Op Procedure(s) (LRB): TOTAL KNEE ARTHROPLASTY (Left) Patient reports pain as mild.   Patient seen in rounds by Dr. Lequita Halt. Patient is well, but has had some minor complaints of constipation. Denies SOB, chest pain, or calf pain. No acute overnight events. Ambulated 90 feet with therapy yesterday. Will continue therapy today. Patient states she's had no BM since surgery.   Plan is to go Home after hospital stay.  Objective: Vital signs in last 24 hours: Temp:  [97.8 F (36.6 C)-99.5 F (37.5 C)] 98.1 F (36.7 C) (08/04 0524) Pulse Rate:  [54-59] 59 (08/04 0524) Resp:  [16-18] 17 (08/04 0524) BP: (86-131)/(54-76) 131/66 (08/04 0524) SpO2:  [98 %-100 %] 99 % (08/04 0524) Weight:  [87.5 kg] 87.5 kg (08/03 1433)  Intake/Output from previous day:  Intake/Output Summary (Last 24 hours) at 08/14/2020 0719 Last data filed at 08/14/2020 0600 Gross per 24 hour  Intake 3260 ml  Output 1100 ml  Net 2160 ml    Intake/Output this shift: No intake/output data recorded.  Labs: Recent Labs    08/12/20 0401 08/13/20 0409 08/14/20 0426  HGB 11.8* 11.4* 10.5*   Recent Labs    08/13/20 0409 08/14/20 0426  WBC 10.7* 8.8  RBC 3.76* 3.48*  HCT 32.8* 31.1*  PLT 167 169   Recent Labs    08/13/20 0409 08/14/20 0426  NA 127* 128*  K 4.3 3.8  CL 96* 96*  CO2 23 25  BUN 18 22  CREATININE 0.89 0.87  GLUCOSE 140* 104*  CALCIUM 8.5* 8.0*   No results for input(s): LABPT, INR in the last 72 hours.  Exam: General - Patient is Alert and Oriented Extremity - Neurologically intact Neurovascular intact Intact pulses distally Dorsiflexion/Plantar flexion intact Dressing/Incision - clean, dry, no drainage Motor Function - intact, moving foot and toes well on exam.   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HCC)    Arthritis    Branch retinal artery occlusion, left eye    Chronic kidney disease    moderate   Fatigue    Femoral  hernia of right side with obstruction and without gangrene 11/25/2017   Hyperkalemia    Hyperlipidemia    Hypertension    Pneumonia    at least 10 years ago   Pre-diabetes     Assessment/Plan: 3 Days Post-Op Procedure(s) (LRB): TOTAL KNEE ARTHROPLASTY (Left) Principal Problem:   OA (osteoarthritis) of knee Active Problems:   Primary osteoarthritis of left knee   Osteoarthritis of knee  Estimated body mass index is 34.17 kg/m as calculated from the following:   Height as of this encounter: 5\' 3"  (1.6 m).   Weight as of this encounter: 87.5 kg. Up with therapy  DVT Prophylaxis -  Eliquis Weight-bearing as tolerated.  Dulcolax suppository ordered.   Plan for two sessions with PT today,  and if meeting goals, will plan for discharge this afternoon.   Patient to follow up in two weeks with Dr. in clinic.   The PDMP database was reviewed today prior to any opioid medications being prescribed to this patient.Lequita Halt, MBA, PA-C Orthopedic Surgery 08/14/2020, 7:19 AM

## 2020-08-14 NOTE — Plan of Care (Signed)
Instructions were reviewed with patient. All questions were answered. Patient was transported to main entrance by wheelchair. ° °

## 2020-08-14 NOTE — Progress Notes (Signed)
Physical Therapy Treatment Patient Details Name: Joanne Lewis MRN: [id number] DOB: [birth year] Today's Date: 08/14/2020    History of Present Illness 75  YO female S/P L TKA,PMH: HTN, fatigue, hernia, CKD.    PT Comments    Patient reports no dizziness, Improved mobility. Patient had not S/s of hypotension. Reviewed HEP and steps. Patient ready to Dc today   Follow Up Recommendations   OPPT     Equipment Recommendations     RW/3in 1  Recommendations for Other Services       Precautions / Restrictions Precautions Precautions: Fall;Knee Precaution Comments: hypotensive Required Braces or Orthoses: Knee Immobilizer - Left    Mobility  Bed Mobility Overal bed mobility: Modified Independent                  Transfers   Equipment used: Rolling walker (2 wheeled) Transfers: Sit to/from Stand Sit to Stand: Supervision         General transfer comment: cues for had and LLE placement from bed and low toilet  Ambulation/Gait Ambulation/Gait assistance: Min guard Gait Distance (Feet): 90 Feet Assistive device: Rolling walker (2 wheeled) Gait Pattern/deviations: Step-to pattern;Step-through pattern     General Gait Details: cues for sequence.   Stairs         General stair comments: verbally reviewed technique as performed yesterday   Wheelchair Mobility    Modified Rankin (Stroke Patients Only)       Balance Overall balance assessment: Needs assistance Sitting-balance support: No upper extremity supported;Feet supported Sitting balance-Leahy Scale: Good     Standing balance support: During functional activity;No upper extremity supported Standing balance-Leahy Scale: Fair Standing balance comment: to pull up panties                            Cognition Arousal/Alertness: Awake/alert Behavior During Therapy: WFL for tasks assessed/performed Overall Cognitive Status: Within Functional Limits for tasks assessed                                  General Comments: requests frequent review of Home program and steps      Exercises Total Joint Exercises Ankle Circles/Pumps: AROM;Both;10 reps Quad Sets: AROM;Both;10 reps Short Arc Quad: AROM;Left;10 reps Heel Slides: AAROM;Left;10 reps;Supine Hip ABduction/ADduction: AROM;Left;10 reps;Supine Straight Leg Raises: AROM;Left;10 reps;Supine Knee Flexion: AROM;10 reps;Left    General Comments        Pertinent Vitals/Pain Pain Score: 2  Pain Location: left knee Pain Descriptors / Indicators: Discomfort Pain Intervention(s): Monitored during session;Ice applied    Home Living                      Prior Function            PT Goals (current goals can now be found in the care plan section) Progress towards PT goals: Progressing toward goals    Frequency           PT Plan      Co-evaluation              AM-PAC PT "6 Clicks" Mobility   Outcome Measure                   End of Session Equipment Utilized During Treatment: Gait belt Activity Tolerance: Patient tolerated treatment well Patient left: with call bell/phone within reach (in bathroom) Nurse Communication: Mobility status  Time: 8101-7510 PT Time Calculation (min) (ACUTE ONLY): 39 min  Charges:  $Gait Training: 8-22 mins $Therapeutic Exercise: 8-22 mins $Self Care/Home Management: 8-22                    Blanchard Kelch PT Acute Rehabilitation Services Pager [phone] Office [phone]   Rada Hay 08/14/2020, 1:40 PM

## 2020-08-19 ENCOUNTER — Encounter: Payer: Self-pay | Admitting: Physical Therapy

## 2020-08-19 ENCOUNTER — Ambulatory Visit: Payer: Medicare Other | Attending: Internal Medicine | Admitting: Physical Therapy

## 2020-08-19 ENCOUNTER — Other Ambulatory Visit: Payer: Self-pay

## 2020-08-19 DIAGNOSIS — M25662 Stiffness of left knee, not elsewhere classified: Secondary | ICD-10-CM | POA: Diagnosis present

## 2020-08-19 DIAGNOSIS — M6281 Muscle weakness (generalized): Secondary | ICD-10-CM | POA: Insufficient documentation

## 2020-08-19 DIAGNOSIS — M25562 Pain in left knee: Secondary | ICD-10-CM | POA: Diagnosis present

## 2020-08-19 DIAGNOSIS — M79672 Pain in left foot: Secondary | ICD-10-CM | POA: Diagnosis present

## 2020-08-19 DIAGNOSIS — M25661 Stiffness of right knee, not elsewhere classified: Secondary | ICD-10-CM | POA: Diagnosis present

## 2020-08-19 DIAGNOSIS — R269 Unspecified abnormalities of gait and mobility: Secondary | ICD-10-CM | POA: Insufficient documentation

## 2020-08-19 DIAGNOSIS — G8929 Other chronic pain: Secondary | ICD-10-CM | POA: Diagnosis present

## 2020-08-19 DIAGNOSIS — M25561 Pain in right knee: Secondary | ICD-10-CM | POA: Insufficient documentation

## 2020-08-19 NOTE — Therapy (Signed)
[facility] [address] [place], [place], [address] Phone: [phone]   Fax:  [phone]  Physical Therapy Treatment and Initial Eval 08/19/20  Patient Details  Name: Joanne Lewis MRN: [id number] Date of Birth: October 16, 1945 Referring Provider (PT): Alyssa Grove, PA-C   Encounter Date: 08/19/2020   PT End of Session - 08/19/20 1230     Visit Number 1    Number of Visits 16    Date for PT Re-Evaluation 10/14/20    Authorization Type Traditional medicare A&B    Authorization - Visit Number 1    Authorization - Number of Visits 10    PT Start Time 1105    PT Stop Time 1158    PT Time Calculation (min) 53 min    Activity Tolerance Patient tolerated treatment well;Patient limited by fatigue    Behavior During Therapy [facility] for tasks assessed/performed;Anxious   Pt displays mod anxious during eval about the entire TKA process.            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HCC)    Arthritis    Branch retinal artery occlusion, left eye    Chronic kidney disease    moderate   Fatigue    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 11/25/2017   Hyperkalemia    Hyperlipidemia    Hypertension    Pneumonia    at least 10 years ago   Pre-diabetes     Past Surgical History:  Procedure Laterality Date   COLONOSCOPY WITH PROPOFOL N/A 08/09/2016   Procedure: COLONOSCOPY WITH PROPOFOL;  Surgeon: Scot Jun, MD;  [facility] ENDOSCOPY;  Service: Endoscopy;  Laterality: N/A;   DILATATION & CURETTAGE/HYSTEROSCOPY WITH MYOSURE N/A 02/20/2018   Procedure: DILATATION & CURETTAGE/HYSTEROSCOPY WITH MYOSURE;  Surgeon: Schermerhorn, Ihor Austin, MD;  Location: [facility] ORS;  Service: Gynecology;  Laterality: N/A;   DILATION AND CURETTAGE OF UTERUS N/A 02/20/2018   Procedure: DILATATION AND CURETTAGE, fractional, resection of endometrial mass, possible myosure, hysteroscopy;  Surgeon:  Schermerhorn, Ihor Austin, MD;  Location: [facility] ORS;  Service: Gynecology;  Laterality: N/A;   FEMORAL HERNIA REPAIR Right 11/26/2017   Procedure: HERNIA REPAIR FEMORAL with insertion or mesh;  Surgeon: Ancil Linsey, MD;  Location: [facility] ORS;  Service: General;  Laterality: Right;   HYSTEROSCOPY WITH D & C N/A 02/20/2018   Procedure: DILATATION AND CURETTAGE /HYSTEROSCOPY,;  Surgeon: Schermerhorn, Ihor Austin, MD;  Location: [facility] ORS;  Service: Gynecology;  Laterality: N/A;   TOTAL KNEE ARTHROPLASTY Left 08/11/2020   Procedure: TOTAL KNEE ARTHROPLASTY;  Surgeon: Ollen Gross, MD;  Location: [facility] ORS;  Service: Orthopedics;  Laterality: Left;    There were no vitals filed for this visit.   Subjective Assessment - 08/19/20 1223     Subjective Pt present to tx s/p L total knee arthroplasty 08/11/20. Pt had a prolonged hospital stay due to kidney and blood pressure involvement, has sense resolved. Pt access to PT was delayed by ~ 1 week. Pt has displayed good adherence to basic HEP targeted for post-op TKA. Pt is slightly anxious about current progress compared to what is "normal". Pt at start of tx 2/10.    Pertinent History Pt is currently amb with FWW during home and community amb. Pt requires assist with transportation at this time. [place] and [place]. Pt has prolonged history of bilat knee pain 2/2 OA  with accompanying heel pain.    Limitations Walking;Lifting;Standing;House hold activities;Other (comment)   Walking on the beach.   How long can you sit comfortably? WNL    How long can you stand comfortably? 10 mins    How long can you walk comfortably? 120 ft with FWW    Diagnostic tests n/a    Patient Stated Goals Return to walking [place] the beach Enjoy life without having to need seated breaks.    Currently in Pain? Yes    Pain Score 2     Pain Location Knee    Pain Orientation Left;Posterior;Anterior    Pain Descriptors / Indicators Aching;Sore     Pain Type Surgical pain    Pain Frequency Intermittent             SUBJECTIVE Chief complaint:  Pt present to tx s/p L total knee arthroplasty 08/11/20. Pt had a prolonged hospital stay due to kidney and blood pressure involvement. Pt access to PT was delayed by ~ 1 week. Pt  History: Pt is currently amb with FWW during home and community amb. Pt requires assist with transportation at this time. [place] and [place]. Pt has prolonged history of bilat knee pain 2/2 OA with accompanying heel pain.       Referring Dx: L TKA 08/11/20 Referring Provider: Nelia ShiSean Childress  Pain location: L knee, Post>ant  Pain: Present 2/10, Best 0/10, Worst 6/10: Pain quality: Ache, Sore,  Radiating pain: none  Numbness/Tingling: none  24 hour pain behavior: First thing in the morning.  Aggravating factors: Nothing specific noted  Easing factors: Ice  How long can you sit: WNL How long can you stand: 10 mins  How long can you walk: ~120 ft History of back injury, pain, surgery, or therapy: Yes TKA 08/11/20 Follow-up appointment with MD: 08/26/20 Dominant hand: Right  Imaging: n/a Falls in the last 6 months: none  Occupational demands: retired  [place]  Goals[place] without having to need seated breaks.  Red flags (bowel/bladder changes, saddle paresthesia, personal history of cancer, chills/fever, night sweats, unrelenting pain, first onset of insidious LBP <20 y/o) Negative    OBJECTIVE  Mental Status Patient is oriented to person, place and time.  Recent memory is intact.  Remote memory is intact.  Attention span and concentration are intact.  Expressive speech is intact.  Patient's fund of knowledge is within normal limits for educational level.  SENSATION: Grossly intact to light touch bilateral LEs as determined by testing dermatomes L2-S2    MUSCULOSKELETAL: Increased L knee edema  compared to R  Circumference values: R/L 5cm above superior patellar pole 48/54, R/L mid patella 44/50, R/L tibial tuberosity: 39/42     Posture Bilat knee flexion in standing. Bilat knee ext deficits   Gait Pt amb with FWW in appropriate sequence and mild UE assist. Pt displays bilat crouch gait, Mild L antalgic gait, and bilat hip hiking during swing phase.     Palpation TTP global L knee post>ant.    Strength (out of 5) R/L 4/4 Hip flexion 5/5 Hip abduction seated 5/5 Hip adduction seated 5/3 Knee extension 5/3 Knee flexion 5/5 Ankle dorsiflexion 5/5 Ankle plantarflexion *no resistance applied to L knee flex/ext due to acute phase of TKA.     AROM (degrees)  Knee Flexion (0-135): R:105 L:90 (95 PROM) Knee Extension (0): R: +15 L: +20  Patellar mobilization: L  patella more mobile then R.    Muscle Length Deferred to later stage of rehab.       SPECIAL TESTS N/a    Functional Tasks   Sit to stand: slight weight shift to R. Correct sequence of UE assist with presents of a walker.   Car transfer: MOD I with FWW, needs assist with FWW break down and placement in back seat. Pt used UE assist for placement of LLE in car.       PT Education - 08/19/20 1228     Education Details Pt ed was provided continued HEP adherence, increase walking without increase in pain, and continued ice for pain management.    Person(s) Educated Patient    Methods Explanation;Verbal cues    Comprehension Verbalized understanding;Need further instruction                 PT Long Term Goals - 08/19/20 1236       PT LONG TERM GOAL #1   Title Pt will increase FOTO score to 72 to promote pt report return to independence with ADLs    Baseline 67    Time 8    Period Weeks    Status New    Target Date 10/14/20      PT LONG TERM GOAL #2   Title Pt will increase LLE MMT equal to right to promote return to high level gait distances.    Baseline R/L  4/4 Hip flexion  5/5 Hip  abduction seated  5/5 Hip adduction seated  5/3 Knee extension  5/3 Knee flexion 5/5 Ankle dorsiflexion  5/5 Ankle plantarflexion *no resistance applied    Time 8    Period Weeks    Status New    Target Date 10/14/20      PT LONG TERM GOAL #3   Title Pt will displayed reduced swelling in LLE so that circumference measures equal the RLE to promote increase ROM and normalized gait.    Baseline R/L 5cm above superior patellar pole 48/54, R/L mid patella 44/50, R/L tibial tuberosity: 39/42    Time 8    Period Weeks    Status New    Target Date 10/14/20      PT LONG TERM GOAL #4   Title PT will improve L knee ROM equal to R for decreased crouched gait during standing and gait activities    Baseline Knee Flexion (0-135): R:105 L:90 (95 PROM)  Knee Extension (0): R: +15 L: +20    Time 8    Period Weeks    Status New    Target Date 10/14/20      PT LONG TERM GOAL #5   Title PT will be able to amb 1025ft  without AD to promote return amb [place] the beach during leisure visits    Baseline 300 ft with FWW SBA    Time 8    Period Weeks    Status New    Target Date 09/02/20                   Plan - 08/19/20 1232     Clinical Impression Statement Pt. is a pleasant 75 year-old female to presents to PT s/p L TKA 08/11/20.  Pt access to PT was delayed by ~1 week. Pt has displayed good adherence to basic HEP targeted for post-op TKA. Pt was slightly anxious about current progress compared to what is "normal". Pt at start of tx 2/10. Pt displays deficit including:  FOTO  of 67 with predicted improvement of 72. L knee MMT 3/5,  L knee AROM: -20 to 90 deg.  PROM L knee flexion: 95 deg.  Current amb with FWW, and bilat knee ROM deficits. Pt displays good prognosis with skilled PT and will benefit from 2x week for 8 weeks to address deficits and return to optimal PLOF. Pt was educated on proper compression stocking adherence, icing, and prognosis for return to optimal PLOF.    Personal Factors and  Comorbidities Social Background;Transportation    Examination-Activity Limitations Bend;Stairs;Stand;Squat;Locomotion Level    Stability/Clinical Decision Making Evolving/Moderate complexity    Clinical Decision Making Moderate    Rehab Potential Good    PT Frequency 2x / week    PT Duration 8 weeks    PT Treatment/Interventions ADLs/Self Care Home Management;Aquatic Therapy;Cryotherapy;Moist Heat;Gait training;DME Instruction;Neuromuscular re-education;Balance training;Therapeutic exercise;Therapeutic activities;Functional mobility training;Stair training;Patient/family education;Manual techniques;Passive range of motion;Biofeedback;Electrical Stimulation;Traction;Dry needling;Energy conservation;Taping;Joint Manipulations;Other (comment);Spinal Manipulations    PT Next Visit Plan Reassess gait distance and 5xSTS    PT Home Exercise Plan Surgical office provided    Recommended Other Services Not at this time    Consulted and Agree with Plan of Care Patient             Patient will benefit from skilled therapeutic intervention in order to improve the following deficits and impairments:  Abnormal gait, Decreased range of motion, Difficulty walking, Impaired tone, Decreased endurance, Obesity, Pain, Decreased activity tolerance, Decreased balance, Hypomobility, Impaired flexibility, Improper body mechanics, Postural dysfunction, Decreased mobility, Decreased strength, Decreased skin integrity, Increased edema  Visit Diagnosis: Knee joint stiffness, bilateral  Gait difficulty  Muscle weakness (generalized)  Acute pain of left knee     Problem List Patient Active Problem List   Diagnosis Date Noted   Osteoarthritis of knee 08/13/2020   OA (osteoarthritis) of knee 08/11/2020   Primary osteoarthritis of left knee 08/11/2020   SOB (shortness of breath) 10/10/2017   Coronary artery calcification 09/19/2016   Chest pain 02/03/2016   Elevated troponin 02/03/2016   Anxiety 02/03/2016    Essential hypertension 02/03/2016   Mixed hyperlipidemia 02/03/2016   Chest tightness 01/01/2016   Cammie Mcgee, PT, DPT # [id number] Lawernce Ion, SPT 08/19/2020, 3:30 PM  [place] [facility] [address] [place], [place], [address] Phone: [phone]   Fax:  [phone]  Name: KAELENE ELLISTON MRN: [id number] Date of Birth: 06-16-1945

## 2020-08-21 ENCOUNTER — Other Ambulatory Visit: Payer: Self-pay

## 2020-08-21 ENCOUNTER — Encounter: Payer: Self-pay | Admitting: Physical Therapy

## 2020-08-21 ENCOUNTER — Ambulatory Visit: Payer: Medicare Other | Admitting: Physical Therapy

## 2020-08-21 DIAGNOSIS — M25562 Pain in left knee: Secondary | ICD-10-CM

## 2020-08-21 DIAGNOSIS — M25661 Stiffness of right knee, not elsewhere classified: Secondary | ICD-10-CM

## 2020-08-21 DIAGNOSIS — M79672 Pain in left foot: Secondary | ICD-10-CM

## 2020-08-21 DIAGNOSIS — M6281 Muscle weakness (generalized): Secondary | ICD-10-CM

## 2020-08-21 DIAGNOSIS — M25662 Stiffness of left knee, not elsewhere classified: Secondary | ICD-10-CM

## 2020-08-21 DIAGNOSIS — R269 Unspecified abnormalities of gait and mobility: Secondary | ICD-10-CM

## 2020-08-21 NOTE — Therapy (Signed)
[facility] [address] [place], [place], [address] Phone: [phone]   Fax:  [phone]  Physical Therapy Treatment  Patient Details  Name: Joanne Lewis MRN: [id number] Date of Birth: 1945/12/12 Referring Provider (PT): Alyssa Grove, PA-C   Encounter Date: 08/21/2020   PT End of Session - 08/21/20 1130     Visit Number 2    Number of Visits 16    Date for PT Re-Evaluation 10/14/20    Authorization Type Traditional medicare A&B    Authorization - Visit Number 2    Authorization - Number of Visits 10    PT Start Time 1028    PT Stop Time 1116    PT Time Calculation (min) 48 min    Activity Tolerance Patient tolerated treatment well;Patient limited by fatigue    Behavior During Therapy [facility] for tasks assessed/performed;Anxious   Pt displays mod anxious during eval about the entire TKA process.            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HCC)    Arthritis    Branch retinal artery occlusion, left eye    Chronic kidney disease    moderate   Fatigue    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 11/25/2017   Hyperkalemia    Hyperlipidemia    Hypertension    Pneumonia    at least 10 years ago   Pre-diabetes     Past Surgical History:  Procedure Laterality Date   COLONOSCOPY WITH PROPOFOL N/A 08/09/2016   Procedure: COLONOSCOPY WITH PROPOFOL;  Surgeon: Scot Jun, MD;  Location: [facility] ENDOSCOPY;  Service: Endoscopy;  Laterality: N/A;   DILATATION & CURETTAGE/HYSTEROSCOPY WITH MYOSURE N/A 02/20/2018   Procedure: DILATATION & CURETTAGE/HYSTEROSCOPY WITH MYOSURE;  Surgeon: Schermerhorn, Ihor Austin, MD;  Location: [facility] ORS;  Service: Gynecology;  Laterality: N/A;   DILATION AND CURETTAGE OF UTERUS N/A 02/20/2018   Procedure: DILATATION AND CURETTAGE, fractional, resection of endometrial mass, possible myosure, hysteroscopy;  Surgeon: Schermerhorn, Ihor Austin,  MD;  Location: [facility] ORS;  Service: Gynecology;  Laterality: N/A;   FEMORAL HERNIA REPAIR Right 11/26/2017   Procedure: HERNIA REPAIR FEMORAL with insertion or mesh;  Surgeon: Ancil Linsey, MD;  Location: [facility] ORS;  Service: General;  Laterality: Right;   HYSTEROSCOPY WITH D & C N/A 02/20/2018   Procedure: DILATATION AND CURETTAGE /HYSTEROSCOPY,;  Surgeon: Schermerhorn, Ihor Austin, MD;  Location: [facility] ORS;  Service: Gynecology;  Laterality: N/A;   TOTAL KNEE ARTHROPLASTY Left 08/11/2020   Procedure: TOTAL KNEE ARTHROPLASTY;  Surgeon: Ollen Gross, MD;  Location: [facility] ORS;  Service: Orthopedics;  Laterality: Left;    There were no vitals filed for this visit.   Subjective Assessment - 08/21/20 1038     Subjective Pt presents to tx with 1/10 L knee pain at start of tx. Pt stated taking a Tramadol at 10 am (30 mins before tx). Pt stated feeling faint and weak this morning after getting ready. Pt caregiver took her BP and it was 81/66. Pt stated her sx were similar to how she felt in the hospital. Pt stated walking 260 ft x 6 a day. Pt ices after each walking about for 20 mins.    Pertinent History Pt is currently amb with FWW during home and community amb. Pt requires assist with transportation at this time. [place]. Pt has prolonged history  of bilat knee pain 2/2 OA with accompanying heel pain.    Limitations Walking;Lifting;Standing;House hold activities;Other (comment)   Walking on the beach.   How long can you sit comfortably? WNL    How long can you stand comfortably? 10 mins    How long can you walk comfortably? 120 ft with FWW    Diagnostic tests n/a    Patient Stated Goals Return to walking on the beach Enjoy life without having to need seated breaks.    Currently in Pain? Yes    Pain Score 1     Pain Location Knee    Pain Orientation Left              Orthostatic hypotension assessment: Supine: BP 135/68 pulse 64   Seated: 124/46 Pulse 65. no sx.  Standing BP: 122/54, Pulse 72 no sx.     Treatment:   Therapeutic Exercise:  LAQ, bilat hip march, bilat heel raise. Seated posture for all with verbal cueing to ensure slow, high quality movement to promote blood flow, muscle activation and decreased pain. X30 each LE.   2x10 STS from high-low table. Table was lowered ~4 inch for the second set for optimal progression. 123/55 BP after second bout. Pt reported lightness/weakness that resulted to baseline within 2:30 mins   Supine hip/knee 90/90 position with therapist assist to support the femor. Verbal cueing to allow the pt to relax the lower leg to let gravity increase knee flexion passively.   Supine quad set with ankle on yoga block. X30   *icepack 10 mins during supine quad sets.        PT Education - 08/21/20 1053     Education Details PT ed was provided to continue to monitor BP and faint-like-sx in the home.    Person(s) Educated Patient    Methods Explanation;Verbal cues    Comprehension Verbalized understanding;Need further instruction                 PT Long Term Goals - 08/19/20 1236       PT LONG TERM GOAL #1   Title Pt will increase FOTO score to 72 to promote pt report return to independence with ADLs    Baseline 67    Time 8    Period Weeks    Status New    Target Date 10/14/20      PT LONG TERM GOAL #2   Title Pt will increase LLE MMT equal to right to promote return to high level gait distances.    Baseline R/L  4/4 Hip flexion  5/5 Hip abduction seated  5/5 Hip adduction seated  5/3 Knee extension  5/3 Knee flexion 5/5 Ankle dorsiflexion  5/5 Ankle plantarflexion *no resistance applied    Time 8    Period Weeks    Status New    Target Date 10/14/20      PT LONG TERM GOAL #3   Title Pt will displayed reduced swelling in LLE so that circumference measures equal the RLE to promote increase ROM and normalized gait.    Baseline R/L 5cm above superior patellar  pole 48/54, R/L mid patella 44/50, R/L tibial tuberosity: 39/42    Time 8    Period Weeks    Status New    Target Date 10/14/20      PT LONG TERM GOAL #4   Title PT will improve L knee ROM equal to R for decreased crouched gait during standing and gait activities  Baseline Knee Flexion (0-135): R:105 L:90 (95 PROM)  Knee Extension (0): R: +15 L: +20    Time 8    Period Weeks    Status New    Target Date 10/14/20      PT LONG TERM GOAL #5   Title PT will be able to amb 1025ft  without AD to promote return amb on the beach during leisure visits    Baseline 300 ft with FWW SBA    Time 8    Period Weeks    Status New    Target Date 09/02/20                   Plan - 08/21/20 1036     Clinical Impression Statement Previous POC for today's tx was to assess 5xSTS and , however, pt reports low pressure before tx shifted focused to understanding BP in different positions. Orthostatic hypotension assessment: Supine: BP 135/68 pulse 64.  Seated: 124/46 Pulse 65. no sx. Standing BP: 122/54, Pulse 72 no sx. Education was provide to the pt and pt's caregiver to continue home monitoring of BP and contact her PCP if she has continued decrease in values. PT return to surgical office for a follow up next Tuesday 08/26/20. Pt continues to display LE strength, endurance, and ROM deficits that are WNL for current stage of rehab. Pt. will continue to benefit from skilled physical therapy to progress POC to address remaining deficits to facilitate maximum functional capacity for optimal personal health and wellness for ADLs.    Personal Factors and Comorbidities Social Background;Transportation    Examination-Activity Limitations Bend;Stairs;Stand;Squat;Locomotion Level    Stability/Clinical Decision Making Evolving/Moderate complexity    Clinical Decision Making Moderate    Rehab Potential Good    PT Frequency 2x / week    PT Duration 8 weeks    PT Treatment/Interventions ADLs/Self Care Home  Management;Aquatic Therapy;Cryotherapy;Moist Heat;Gait training;DME Instruction;Neuromuscular re-education;Balance training;Therapeutic exercise;Therapeutic activities;Functional mobility training;Stair training;Patient/family education;Manual techniques;Passive range of motion;Biofeedback;Electrical Stimulation;Traction;Dry needling;Energy conservation;Taping;Joint Manipulations;Other (comment);Spinal Manipulations    PT Next Visit Plan Reassess gait distance and 5xSTS    PT Home Exercise Plan Surgical office provided    Consulted and Agree with Plan of Care Patient             Patient will benefit from skilled therapeutic intervention in order to improve the following deficits and impairments:  Abnormal gait, Decreased range of motion, Difficulty walking, Impaired tone, Decreased endurance, Obesity, Pain, Decreased activity tolerance, Decreased balance, Hypomobility, Impaired flexibility, Improper body mechanics, Postural dysfunction, Decreased mobility, Decreased strength, Decreased skin integrity, Increased edema  Visit Diagnosis: Knee joint stiffness, bilateral  Pain of left heel  Gait difficulty  Acute pain of left knee  Muscle weakness (generalized)     Problem List Patient Active Problem List   Diagnosis Date Noted   Osteoarthritis of knee 08/13/2020   OA (osteoarthritis) of knee 08/11/2020   Primary osteoarthritis of left knee 08/11/2020   SOB (shortness of breath) 10/10/2017   Coronary artery calcification 09/19/2016   Chest pain 02/03/2016   Elevated troponin 02/03/2016   Anxiety 02/03/2016   Essential hypertension 02/03/2016   Mixed hyperlipidemia 02/03/2016   Chest tightness 01/01/2016   Cammie Mcgee, PT, DPT # [id number] Lawernce Ion SPT 08/21/2020, 3:39 PM  [facility] [address] [place], [place], [address] Phone: [phone]   Fax:  [phone]  Name: Joanne Lewis MRN: [id number] Date of Birth:  January 27, 1945

## 2020-08-26 ENCOUNTER — Encounter: Payer: Self-pay | Admitting: Physical Therapy

## 2020-08-26 ENCOUNTER — Ambulatory Visit: Payer: Medicare Other | Admitting: Physical Therapy

## 2020-08-26 ENCOUNTER — Other Ambulatory Visit: Payer: Self-pay

## 2020-08-26 DIAGNOSIS — M25562 Pain in left knee: Secondary | ICD-10-CM

## 2020-08-26 DIAGNOSIS — M25661 Stiffness of right knee, not elsewhere classified: Secondary | ICD-10-CM

## 2020-08-26 DIAGNOSIS — G8929 Other chronic pain: Secondary | ICD-10-CM

## 2020-08-26 DIAGNOSIS — M6281 Muscle weakness (generalized): Secondary | ICD-10-CM

## 2020-08-26 DIAGNOSIS — M25662 Stiffness of left knee, not elsewhere classified: Secondary | ICD-10-CM

## 2020-08-26 DIAGNOSIS — R269 Unspecified abnormalities of gait and mobility: Secondary | ICD-10-CM

## 2020-08-26 DIAGNOSIS — M79672 Pain in left foot: Secondary | ICD-10-CM

## 2020-08-26 NOTE — Therapy (Signed)
[facility] [address] [place], [place], [address] Phone: [phone]   Fax:  [phone]  Physical Therapy Treatment  Patient Details  Name: Joanne Lewis MRN: [id number] Date of Birth: 1945-03-29 Referring Provider (PT): Nelia Shi D, PA-C   Encounter Date: 08/26/2020   PT End of Session - 08/26/20 1140     Visit Number 3    Number of Visits 16    Date for PT Re-Evaluation 10/14/20    Authorization Type Traditional medicare A&B    Authorization - Visit Number 3    Authorization - Number of Visits 10    PT Start Time 1106    PT Stop Time 1155    PT Time Calculation (min) 49 min    Equipment Utilized During Treatment Gait belt    Activity Tolerance Patient tolerated treatment well;Patient limited by fatigue    Behavior During Therapy [facility] for tasks assessed/performed;Anxious   Pt displays mod anxious during eval about the entire TKA process.            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HCC)    Arthritis    Branch retinal artery occlusion, left eye    Chronic kidney disease    moderate   Fatigue    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 11/25/2017   Hyperkalemia    Hyperlipidemia    Hypertension    Pneumonia    at least 10 years ago   Pre-diabetes     Past Surgical History:  Procedure Laterality Date   COLONOSCOPY WITH PROPOFOL N/A 08/09/2016   Procedure: COLONOSCOPY WITH PROPOFOL;  Surgeon: Scot Jun, MD;  Location: [facility] ENDOSCOPY;  Service: Endoscopy;  Laterality: N/A;   DILATATION & CURETTAGE/HYSTEROSCOPY WITH MYOSURE N/A 02/20/2018   Procedure: DILATATION & CURETTAGE/HYSTEROSCOPY WITH MYOSURE;  Surgeon: Schermerhorn, Ihor Austin, MD;  Location: [facility] ORS;  Service: Gynecology;  Laterality: N/A;   DILATION AND CURETTAGE OF UTERUS N/A 02/20/2018   Procedure: DILATATION AND CURETTAGE, fractional, resection of endometrial mass, possible  myosure, hysteroscopy;  Surgeon: Schermerhorn, Ihor Austin, MD;  Location: [facility] ORS;  Service: Gynecology;  Laterality: N/A;   FEMORAL HERNIA REPAIR Right 11/26/2017   Procedure: HERNIA REPAIR FEMORAL with insertion or mesh;  Surgeon: Ancil Linsey, MD;  Location: [facility] ORS;  Service: General;  Laterality: Right;   HYSTEROSCOPY WITH D & C N/A 02/20/2018   Procedure: DILATATION AND CURETTAGE /HYSTEROSCOPY,;  Surgeon: Schermerhorn, Ihor Austin, MD;  Location: [facility] ORS;  Service: Gynecology;  Laterality: N/A;   TOTAL KNEE ARTHROPLASTY Left 08/11/2020   Procedure: TOTAL KNEE ARTHROPLASTY;  Surgeon: Ollen Gross, MD;  Location: [facility];  Service: Orthopedics;  Laterality: Left;    There were no vitals filed for this visit.   Subjective Assessment - 08/26/20 1124     Subjective Pt presents to tx with 3/10 L knee pain located mostly in the post aspect of the knee. Pt stated increase quality of sleep. Pt stated good adherence to HEP and walking in the home with RW. Pt stated only taking tramadol for pain control.  Pt stated no futher BP-related issues.  Marked improvement in L LE ecchymosis as noted from comparison pictures.    Pertinent History Pt is currently amb with FWW during home and community amb. Pt requires assist with transportation at this time. [place] and [place]. Pt has prolonged history of bilat knee  pain 2/2 OA with accompanying heel pain.    Limitations Walking;Lifting;Standing;House hold activities;Other (comment)   Walking on the beach.   How long can you sit comfortably? WNL    How long can you stand comfortably? 10 mins    How long can you walk comfortably? 120 ft with FWW    Diagnostic tests n/a    Patient Stated Goals Return to walking on the beach Enjoy life without having to need seated breaks.    Currently in Pain? Yes    Pain Score 3     Pain Location Knee    Pain Orientation Left    Pain Descriptors / Indicators Aching;Sore     Pain Type Surgical pain            Assessment:  5xSTS 19.97     Therapeutic Exercise:  NuStep L2 with 5 mins at seat position 8 and 5 mins seat 7 to promote increase in L knee ROM. Pt was able to maintain SPM of 70-90 during activity. SpO2 99%, HR 78, RPE 3/10. Mild-mod discomfort at the ant thigh and proximal L calf where the compression sock was pinching.   Partial Sitting: bilat SLR x20, Heel slides without AAROM to promote hamstring activation 3x10, Quad sets 5 secx10x3. Heel slides and quad sets were done together to promote optimal L knee ROM.   Seated LAQ 3x10 bilat with verbal cueing to ensure movement at the knee and not the hip.    Prolonged hamstring stretch x5 mins without breaks to optimize L knee ext and hamstring mobility. Verbal cueing to relax the LUE and deep breathing.    //-Bars: 3 laps with bilat touch assist. 3 laps with unilat touch assist. 3 laps without UE assist. SBA with gait belt. Extensive verbal cueing to optimize normalized gait pattern.        PT Education - 08/26/20 1139     Education Details Pt was educated on proper form and tech with therex and amb with FWW.    Person(s) Educated Patient    Methods Explanation;Verbal cues    Comprehension Verbalized understanding;Need further instruction                 PT Long Term Goals - 08/26/20 1255       PT LONG TERM GOAL #1   Title Pt will increase FOTO score to 72 to promote pt report return to independence with ADLs    Baseline 67    Time 8    Period Weeks    Status New    Target Date 10/12/20      PT LONG TERM GOAL #2   Title Pt will increase LLE MMT equal to right to promote return to high level gait distances.    Baseline R/L  4/4 Hip flexion  5/5 Hip abduction seated  5/5 Hip adduction seated  5/3 Knee extension  5/3 Knee flexion 5/5 Ankle dorsiflexion  5/5 Ankle plantarflexion *no resistance applied    Time 8    Period Weeks    Status New    Target Date 10/12/20       PT LONG TERM GOAL #3   Title Pt will displayed reduced swelling in LLE so that circumference measures equal the RLE to promote increase ROM and normalized gait.    Baseline R/L 5cm above superior patellar pole 48/54, R/L mid patella 44/50, R/L tibial tuberosity: 39/42    Time 8    Period Weeks    Status New  Target Date 10/12/20      PT LONG TERM GOAL #4   Title PT will improve L knee ROM equal to R for decreased crouched gait during standing and gait activities    Baseline Knee Flexion (0-135): R:105 L:90 (95 PROM)  Knee Extension (0): R: +15 L: +20    Time 8    Period Weeks    Status New    Target Date 10/12/20      PT LONG TERM GOAL #5   Title PT will be able to amb 104700ft  without AD to promote return amb on the beach during leisure visits    Baseline 300 ft with FWW SBA    Time 8    Period Weeks    Status New    Target Date 10/12/20      Additional Long Term Goals   Additional Long Term Goals Yes      PT LONG TERM GOAL #6   Title Pt will improve 5xSTS by at least 3 seconds to promote increased LE power during transfers and gait.    Baseline 8/16 19.97    Time 8    Period Weeks    Status New    Target Date 10/12/20                   Plan - 08/26/20 1134     Clinical Impression Statement Pt tolerated tx very well today with addition of NuStep and amb without FWW in the // bars for touch assist. Pt display 5xSTS in 19.97 seconds with a goal to reduce time by at least 3 second to display greater LE power for ADLs. Pt continues to display LE strength, endurance, and power deficits resulting in decrease independence and reduced mobility. Pt. will continue to benefit from skilled physical therapy to progress POC to address remaining deficits to facilitate maximum functional capacity for optimal personal health and wellness for ADLs.    Personal Factors and Comorbidities Social Background;Transportation    Examination-Activity Limitations  Bend;Stairs;Stand;Squat;Locomotion Level    Stability/Clinical Decision Making Evolving/Moderate complexity    Clinical Decision Making Moderate    Rehab Potential Good    PT Frequency 2x / week    PT Duration 8 weeks    PT Treatment/Interventions ADLs/Self Care Home Management;Aquatic Therapy;Cryotherapy;Moist Heat;Gait training;DME Instruction;Neuromuscular re-education;Balance training;Therapeutic exercise;Therapeutic activities;Functional mobility training;Stair training;Patient/family education;Manual techniques;Passive range of motion;Biofeedback;Electrical Stimulation;Traction;Dry needling;Energy conservation;Taping;Joint Manipulations;Other (comment);Spinal Manipulations    PT Next Visit Plan Reassess gait distance    PT Home Exercise Plan Surgical office provided    Consulted and Agree with Plan of Care Patient             Patient will benefit from skilled therapeutic intervention in order to improve the following deficits and impairments:  Abnormal gait, Decreased range of motion, Difficulty walking, Impaired tone, Decreased endurance, Obesity, Pain, Decreased activity tolerance, Decreased balance, Hypomobility, Impaired flexibility, Improper body mechanics, Postural dysfunction, Decreased mobility, Decreased strength, Decreased skin integrity, Increased edema  Visit Diagnosis: Knee joint stiffness, bilateral  Muscle weakness (generalized)  Pain of left heel  Gait difficulty  Acute pain of left knee  Chronic pain of both knees     Problem List Patient Active Problem List   Diagnosis Date Noted   Osteoarthritis of knee 08/13/2020   OA (osteoarthritis) of knee 08/11/2020   Primary osteoarthritis of left knee 08/11/2020   SOB (shortness of breath) 10/10/2017   Coronary artery calcification 09/19/2016   Chest pain 02/03/2016   Elevated troponin  02/03/2016   Anxiety 02/03/2016   Essential hypertension 02/03/2016   Mixed hyperlipidemia 02/03/2016   Chest tightness  01/01/2016   Cammie Mcgee, PT, DPT # [id number] Lawernce Ion SPT 08/26/2020, 1:38 PM  [facility] [address] [place], [place], [address] Phone: [phone]   Fax:  [phone]  Name: BERENIS CORTER MRN: [id number] Date of Birth: Jun 11, 1945

## 2020-08-28 ENCOUNTER — Other Ambulatory Visit: Payer: Self-pay

## 2020-08-28 ENCOUNTER — Ambulatory Visit: Payer: Medicare Other | Admitting: Physical Therapy

## 2020-08-28 ENCOUNTER — Encounter: Payer: Self-pay | Admitting: Physical Therapy

## 2020-08-28 DIAGNOSIS — M25562 Pain in left knee: Secondary | ICD-10-CM

## 2020-08-28 DIAGNOSIS — M79672 Pain in left foot: Secondary | ICD-10-CM

## 2020-08-28 DIAGNOSIS — R269 Unspecified abnormalities of gait and mobility: Secondary | ICD-10-CM

## 2020-08-28 DIAGNOSIS — M6281 Muscle weakness (generalized): Secondary | ICD-10-CM

## 2020-08-28 DIAGNOSIS — M25661 Stiffness of right knee, not elsewhere classified: Secondary | ICD-10-CM | POA: Diagnosis not present

## 2020-08-28 NOTE — Therapy (Signed)
[facility] [address] [place], [place], [address] Phone: [phone]   Fax:  [phone]  Physical Therapy Treatment  Patient Details  Name: Joanne Lewis MRN: [id number] Date of Birth: 06/08/1945 Referring Provider (PT): Alyssa Grove, PA-C   Encounter Date: 08/28/2020   PT End of Session - 08/28/20 1048     Visit Number 4    Number of Visits 16    Date for PT Re-Evaluation 10/14/20    Authorization Type Traditional medicare A&B    Authorization - Visit Number 4    Authorization - Number of Visits 10    PT Start Time 1114    PT Stop Time 1213    PT Time Calculation (min) 59 min    Equipment Utilized During Treatment Gait belt    Activity Tolerance Patient tolerated treatment well;Patient limited by fatigue    Behavior During Therapy [facility] for tasks assessed/performed;Anxious   Pt displays mod anxious during eval about the entire TKA process.            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HCC)    Arthritis    Branch retinal artery occlusion, left eye    Chronic kidney disease    moderate   Fatigue    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 11/25/2017   Hyperkalemia    Hyperlipidemia    Hypertension    Pneumonia    at least 10 years ago   Pre-diabetes     Past Surgical History:  Procedure Laterality Date   COLONOSCOPY WITH PROPOFOL N/A 08/09/2016   Procedure: COLONOSCOPY WITH PROPOFOL;  Surgeon: Scot Jun, MD;  Location: [facility] ENDOSCOPY;  Service: Endoscopy;  Laterality: N/A;   DILATATION & CURETTAGE/HYSTEROSCOPY WITH MYOSURE N/A 02/20/2018   Procedure: DILATATION & CURETTAGE/HYSTEROSCOPY WITH MYOSURE;  Surgeon: Schermerhorn, Ihor Austin, MD;  Location: [facility] ORS;  Service: Gynecology;  Laterality: N/A;   DILATION AND CURETTAGE OF UTERUS N/A 02/20/2018   Procedure: DILATATION AND CURETTAGE, fractional, resection of endometrial mass, possible  myosure, hysteroscopy;  Surgeon: Schermerhorn, Ihor Austin, MD;  Location: [facility] ORS;  Service: Gynecology;  Laterality: N/A;   FEMORAL HERNIA REPAIR Right 11/26/2017   Procedure: HERNIA REPAIR FEMORAL with insertion or mesh;  Surgeon: Ancil Linsey, MD;  Location: [facility] ORS;  Service: General;  Laterality: Right;   HYSTEROSCOPY WITH D & C N/A 02/20/2018   Procedure: DILATATION AND CURETTAGE /HYSTEROSCOPY,;  Surgeon: Schermerhorn, Ihor Austin, MD;  Location: [facility] ORS;  Service: Gynecology;  Laterality: N/A;   TOTAL KNEE ARTHROPLASTY Left 08/11/2020   Procedure: TOTAL KNEE ARTHROPLASTY;  Surgeon: Ollen Gross, MD;  Location: [facility] ORS;  Service: Orthopedics;  Laterality: Left;    There were no vitals filed for this visit.   Subjective Assessment - 08/28/20 1047     Subjective Pt. States MD appt. Went well and he is happy with incision healing.  Pt. entered PT with use of RW.    Pertinent History Pt is currently amb with FWW during home and community amb. Pt requires assist with transportation at this time. [place] and [place]. Pt has prolonged history of bilat knee pain 2/2 OA with accompanying heel pain.    Limitations Walking;Lifting;Standing;House hold activities;Other (comment)   Walking on the beach.   How long can you sit comfortably? WNL    How long can you stand comfortably? 10 mins  How long can you walk comfortably? 120 ft with FWW    Diagnostic tests n/a    Patient Stated Goals Return to walking on the beach Enjoy life without having to need seated breaks.    Currently in Pain? Yes    Pain Score 3     Pain Location Knee    Pain Orientation Left                 Therapeutic Exercise:   NuStep L3 with 8 mins at seat position 8 and 2 mins seat 7 to promote increase in L knee ROM.  SpO2 98%, HR 76.      Ambulate in hallway with use of RW working on increase step pattern/ L heel strike during reciprocal gait pattern.  Progressing  to use of SPC in //-bars with cuing for 2-point gait pattern and proper step length/ heel strike.  Ascending/ descending stairs with use of SPC and 1 handrail for safety.  Pt. Has step to gait on stairs and benefits from handrail at this time.   R sidelying: hip abduction 20x on blue mat table.    Standing gastroc stretches at //-bars with static holds 5x with holds  Seated LAQ 3x10 bilat with verbal cueing to ensure movement at the knee and not the hip.      Manual tx.:  Seated L knee extension after contract-relax/ distal hamstring stretches (-7 deg.).    Will assess incision/ scar healing next tx.       PT Long Term Goals - 08/26/20 1255       PT LONG TERM GOAL #1   Title Pt will increase FOTO score to 72 to promote pt report return to independence with ADLs    Baseline 67    Time 8    Period Weeks    Status New    Target Date 10/12/20      PT LONG TERM GOAL #2   Title Pt will increase LLE MMT equal to right to promote return to high level gait distances.    Baseline R/L  4/4 Hip flexion  5/5 Hip abduction seated  5/5 Hip adduction seated  5/3 Knee extension  5/3 Knee flexion 5/5 Ankle dorsiflexion  5/5 Ankle plantarflexion *no resistance applied    Time 8    Period Weeks    Status New    Target Date 10/12/20      PT LONG TERM GOAL #3   Title Pt will displayed reduced swelling in LLE so that circumference measures equal the RLE to promote increase ROM and normalized gait.    Baseline R/L 5cm above superior patellar pole 48/54, R/L mid patella 44/50, R/L tibial tuberosity: 39/42    Time 8    Period Weeks    Status New    Target Date 10/12/20      PT LONG TERM GOAL #4   Title PT will improve L knee ROM equal to R for decreased crouched gait during standing and gait activities    Baseline Knee Flexion (0-135): R:105 L:90 (95 PROM)  Knee Extension (0): R: +15 L: +20    Time 8    Period Weeks    Status New    Target Date 10/12/20      PT LONG TERM GOAL #5   Title  PT will be able to amb 1069ft  without AD to promote return amb on the beach during leisure visits    Baseline 300 ft with FWW SBA  Time 8    Period Weeks    Status New    Target Date 10/12/20      Additional Long Term Goals   Additional Long Term Goals Yes      PT LONG TERM GOAL #6   Title Pt will improve 5xSTS by at least 3 seconds to promote increased LE power during transfers and gait.    Baseline 8/16 19.97    Time 8    Period Weeks    Status New    Target Date 10/12/20                   Plan - 08/28/20 1048     Clinical Impression Statement Pt. demonstrates good understanding or proper use of SPC in R UE with 2-point gait pattern.  Pt. benefits from use of 1 handrail while ascending/ descending stairs with use of SPC for safety.  Pt. instructed to start using SPC in home but RW outside/ stairs at house for safety at this time.  L knee extension lacking 7 degrees after manual stretches/ contract-relax in seated position.  No change to HEP at this time and pt. will remain active/ increase walking distance over weekend.    Personal Factors and Comorbidities Social Background;Transportation    Examination-Activity Limitations Bend;Stairs;Stand;Squat;Locomotion Level    Stability/Clinical Decision Making Evolving/Moderate complexity    Clinical Decision Making Moderate    Rehab Potential Good    PT Frequency 2x / week    PT Duration 8 weeks    PT Treatment/Interventions ADLs/Self Care Home Management;Aquatic Therapy;Cryotherapy;Moist Heat;Gait training;DME Instruction;Neuromuscular re-education;Balance training;Therapeutic exercise;Therapeutic activities;Functional mobility training;Stair training;Patient/family education;Manual techniques;Passive range of motion;Biofeedback;Electrical Stimulation;Traction;Dry needling;Energy conservation;Taping;Joint Manipulations;Other (comment);Spinal Manipulations    PT Next Visit Plan Reassess gait distance    PT Home Exercise Plan  Surgical office provided/ check incision healing.    Consulted and Agree with Plan of Care Patient             Patient will benefit from skilled therapeutic intervention in order to improve the following deficits and impairments:  Abnormal gait, Decreased range of motion, Difficulty walking, Impaired tone, Decreased endurance, Obesity, Pain, Decreased activity tolerance, Decreased balance, Hypomobility, Impaired flexibility, Improper body mechanics, Postural dysfunction, Decreased mobility, Decreased strength, Decreased skin integrity, Increased edema  Visit Diagnosis: Knee joint stiffness, bilateral  Muscle weakness (generalized)  Pain of left heel  Gait difficulty  Acute pain of left knee     Problem List Patient Active Problem List   Diagnosis Date Noted   Osteoarthritis of knee 08/13/2020   OA (osteoarthritis) of knee 08/11/2020   Primary osteoarthritis of left knee 08/11/2020   SOB (shortness of breath) 10/10/2017   Coronary artery calcification 09/19/2016   Chest pain 02/03/2016   Elevated troponin 02/03/2016   Anxiety 02/03/2016   Essential hypertension 02/03/2016   Mixed hyperlipidemia 02/03/2016   Chest tightness 01/01/2016   Cammie Mcgee, PT, DPT # [id number] 08/28/2020, 3:04 PM  [facility] [address] [place], [place], [address] Phone: [phone]   Fax:  [phone]  Name: Joanne Lewis MRN: [id number] Date of Birth: 12-28-1945

## 2020-08-28 NOTE — Discharge Summary (Signed)
Physician Discharge Summary   Patient ID: Joanne Lewis MRN: [id number] DOB/AGE: 04-08-1945 75 y.o.  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   Weight as of this encounter: 87.5 kg.  Procedure:  Procedure(s) (LRB): TOTAL KNEE ARTHROPLASTY (Left)   Consults: None  HPI: is a 75 y.o. year old female with end stage OA of her left knee with progressively worsening pain and dysfunction. She has constant pain, with activity and at rest and significant functional deficits with difficulties even with ADLs. She has had extensive non-op management including analgesics, injections of cortisone and viscosupplements, and home exercise program, but remains in significant pain with significant dysfunction. Radiographs show bone on bone arthritis medial and patellofemoral. She presents now for left Total Knee Arthroplasty.  Laboratory Data: Admission on 08/11/2020, Discharged on 08/14/2020  Component Date Value Ref Range Status   Glucose-Capillary 08/11/2020 153 (A) 70 - 99 mg/dL Final   Glucose reference range applies only to samples taken after fasting for at least 8 hours.   WBC 08/12/2020 10.3  4.0 - 10.5 K/uL Final   RBC 08/12/2020 3.85 (A) 3.87 - 5.11  MIL/uL Final   Hemoglobin 08/12/2020 11.8 (A) 12.0 - 15.0 g/dL Final   HCT 10/12/2020 34.1 (A) 36.0 - 46.0 % Final   MCV 08/12/2020 88.6  80.0 - 100.0 fL Final   MCH 08/12/2020 30.6  26.0 - 34.0 pg Final   MCHC 08/12/2020 34.6  30.0 - 36.0 g/dL Final   RDW 10/12/2020 12.2  11.5 - 15.5 % Final   Platelets 08/12/2020 159  150 - 400 K/uL Final   nRBC 08/12/2020 0.0  0.0 - 0.2 % Final   Performed at [facility], [address] [place], [place]   Sodium 08/12/2020 130 (A) 135 - 145 mmol/L Final   Potassium 08/12/2020 4.0  3.5 - 5.1 mmol/L Final   Chloride 08/12/2020 100  98 - 111 mmol/L Final   CO2 08/12/2020 23  22 - 32 mmol/L Final   Glucose, Bld 08/12/2020 122 (A) 70 - 99 mg/dL Final   Glucose reference range applies only to samples taken after fasting for at least 8 hours.   BUN 08/12/2020 14  8 - 23 mg/dL Final   Creatinine, Ser 08/12/2020 1.03 (A) 0.44 - 1.00 mg/dL Final   Calcium 10/12/2020 8.3 (A) 8.9 - 10.3 mg/dL Final   GFR, Estimated 08/12/2020 57 (A) >60 mL/min Final   Comment: (NOTE) Calculated using the CKD-EPI Creatinine Equation (2021)    Anion gap 08/12/2020 7  5 - 15 Final   Performed at [facility], [address], [place], [place]  WBC 08/13/2020 10.7 (A) 4.0 - 10.5 K/uL Final   RBC 08/13/2020 3.76 (A) 3.87 - 5.11 MIL/uL Final   Hemoglobin 08/13/2020 11.4 (A) 12.0 - 15.0 g/dL Final   HCT 34/19/3790 32.8 (A) 36.0 - 46.0 % Final   MCV 08/13/2020 87.2  80.0 - 100.0 fL Final   MCH 08/13/2020 30.3  26.0 - 34.0 pg Final   MCHC 08/13/2020 34.8  30.0 - 36.0 g/dL Final   RDW 24/09/7351 12.2  11.5 - 15.5 % Final   Platelets 08/13/2020 167  150 - 400 K/uL Final   nRBC 08/13/2020 0.0  0.0 - 0.2 % Final   Performed at [facility], [address], [place], [place] [address]   Sodium 08/13/2020 127 (A) 135 - 145 mmol/L Final   Potassium 08/13/2020 4.3  3.5 - 5.1 mmol/L Final   Chloride 08/13/2020  96 (A) 98 - 111 mmol/L Final   CO2 08/13/2020 23  22 - 32 mmol/L Final   Glucose, Bld 08/13/2020 140 (A) 70 - 99 mg/dL Final   Glucose reference range applies only to samples taken after fasting for at least 8 hours.   BUN 08/13/2020 18  8 - 23 mg/dL Final   Creatinine, Ser 08/13/2020 0.89  0.44 - 1.00 mg/dL Final   Calcium 26/83/4196 8.5 (A) 8.9 - 10.3 mg/dL Final   GFR, Estimated 08/13/2020 >60  >60 mL/min Final   Comment: (NOTE) Calculated using the CKD-EPI Creatinine Equation (2021)    Anion gap 08/13/2020 8  5 - 15 Final   Performed at [facility], [address] [place], [place] [address]   WBC 08/14/2020 8.8  4.0 - 10.5 K/uL Final   RBC 08/14/2020 3.48 (A) 3.87 - 5.11 MIL/uL Final   Hemoglobin 08/14/2020 10.5 (A) 12.0 - 15.0 g/dL Final   HCT 98/92/1194 31.1 (A) 36.0 - 46.0 % Final   MCV 08/14/2020 89.4  80.0 - 100.0 fL Final   MCH 08/14/2020 30.2  26.0 - 34.0 pg Final   MCHC 08/14/2020 33.8  30.0 - 36.0 g/dL Final   RDW 17/40/8144 12.6  11.5 - 15.5 % Final   Platelets 08/14/2020 169  150 - 400 K/uL Final   nRBC 08/14/2020 0.0  0.0 - 0.2 % Final   Performed at [facility], [address], [place], [place] [address]   Sodium 08/14/2020 128 (A) 135 - 145 mmol/L Final   Potassium 08/14/2020 3.8  3.5 - 5.1 mmol/L Final   Chloride 08/14/2020 96 (A) 98 - 111 mmol/L Final   CO2 08/14/2020 25  22 - 32 mmol/L Final   Glucose, Bld 08/14/2020 104 (A) 70 - 99 mg/dL Final   Glucose reference range applies only to samples taken after fasting for at least 8 hours.   BUN 08/14/2020 22  8 - 23 mg/dL Final   Creatinine, Ser 08/14/2020 0.87  0.44 - 1.00 mg/dL Final   Calcium 31/49/7026 8.0 (A) 8.9 - 10.3 mg/dL Final   GFR, Estimated 08/14/2020 >60  >60 mL/min Final   Comment: (NOTE) Calculated using the CKD-EPI Creatinine Equation (2021)    Anion gap 08/14/2020 7  5 - 15 Final   Performed at [facility], [address] [place] [address],  [place], [place] [address]  Hospital Outpatient Visit on 08/07/2020  Component Date Value Ref Range Status   SARS Coronavirus 2 08/07/2020 NEGATIVE  NEGATIVE Final   Comment: (NOTE) SARS-CoV-2 target nucleic acids are NOT DETECTED.  The SARS-CoV-2 RNA is generally  detectable in upper and lower respiratory specimens during the acute phase of infection. Negative results do not preclude SARS-CoV-2 infection, do not rule out co-infections with other pathogens, and should not be used as the sole basis for treatment or other patient management decisions. Negative results must be combined with clinical observations, patient history, and epidemiological information. The expected result is Negative.  Fact Sheet for Patients: HairSlick.no  Fact Sheet for Healthcare Providers: quierodirigir.com  This test is not yet approved or cleared by the [place] FDA and  has been authorized for detection and/or diagnosis of SARS-CoV-2 by FDA under an Emergency Use Authorization (EUA). This EUA will remain  in effect (meaning this test can be used) for the duration of the COVID-19 declaration under Se                          ction 564(b)(1) of the Act, 21 U.S.C. section 360bbb-3(b)(1), unless the authorization is terminated or revoked sooner.  Performed at [facility], [address] [place], [place] [address]   Hospital Outpatient Visit on 07/29/2020  Component Date Value Ref Range Status   MRSA, PCR 07/29/2020 NEGATIVE  NEGATIVE Final   Staphylococcus aureus 07/29/2020 NEGATIVE  NEGATIVE Final   Comment: (NOTE) The Xpert SA Assay (FDA approved for NASAL specimens in patients 31 years of age and older), is one component of a comprehensive surveillance program. It is not intended to diagnose infection nor to guide or monitor treatment. Performed at [facility], [address] [place], [place] [address]    WBC  07/29/2020 7.9  4.0 - 10.5 K/uL Final   RBC 07/29/2020 4.96  3.87 - 5.11 MIL/uL Final   Hemoglobin 07/29/2020 14.7  12.0 - 15.0 g/dL Final   HCT 65/78/4696 43.1  36.0 - 46.0 % Final   MCV 07/29/2020 86.9  80.0 - 100.0 fL Final   MCH 07/29/2020 29.6  26.0 - 34.0 pg Final   MCHC 07/29/2020 34.1  30.0 - 36.0 g/dL Final   RDW 29/52/8413 12.0  11.5 - 15.5 % Final   Platelets 07/29/2020 222  150 - 400 K/uL Final   nRBC 07/29/2020 0.0  0.0 - 0.2 % Final   Performed at [facility], [address] [place], [place] [address]   Sodium 07/29/2020 128 (A) 135 - 145 mmol/L Final   Potassium 07/29/2020 4.9  3.5 - 5.1 mmol/L Final   Chloride 07/29/2020 94 (A) 98 - 111 mmol/L Final   CO2 07/29/2020 24  22 - 32 mmol/L Final   Glucose, Bld 07/29/2020 108 (A) 70 - 99 mg/dL Final   Glucose reference range applies only to samples taken after fasting for at least 8 hours.   BUN 07/29/2020 24 (A) 8 - 23 mg/dL Final   Creatinine, Ser 07/29/2020 1.09 (A) 0.44 - 1.00 mg/dL Final   Calcium 02/72/5366 9.2  8.9 - 10.3 mg/dL Final   Total Protein 44/03/4740 7.8  6.5 - 8.1 g/dL Final   Albumin 59/56/3875 4.5  3.5 - 5.0 g/dL Final   AST 64/33/2951 27  15 - 41 U/L Final   ALT 07/29/2020 24  0 - 44 U/L Final   Alkaline Phosphatase 07/29/2020 108  38 - 126 U/L Final   Total Bilirubin 07/29/2020 0.8  0.3 - 1.2 mg/dL Final   GFR, Estimated 07/29/2020 54 (A) >60 mL/min Final   Comment: (NOTE) Calculated using the CKD-EPI Creatinine Equation (2021)  Anion gap 07/29/2020 10  5 - 15 Final   Performed at [facility], [address], [place], [place] [address]   Prothrombin Time 07/29/2020 12.9  11.4 - 15.2 seconds Final   INR 07/29/2020 1.0  0.8 - 1.2 Final   Comment: (NOTE) INR goal varies based on device and disease states. Performed at [facility], [address], [place], [place] [address]    aPTT 07/29/2020 25  24 - 36 seconds Final   Performed at  [facility], [address] [place], [place] [address]   ABO/RH(D) 07/29/2020 B POS   Final   Antibody Screen 07/29/2020 NEG   Final   Sample Expiration 07/29/2020 08/12/2020,2359   Final   Extend sample reason 07/29/2020    Final                   Value:NO TRANSFUSIONS OR PREGNANCY IN THE PAST 3 MONTHS Performed at [facility], [address], [place], [place] [address]    Hgb A1c MFr Bld 07/29/2020 5.6  4.8 - 5.6 % Final   Comment: (NOTE) Pre diabetes:          5.7%-6.4%  Diabetes:              >6.4%  Glycemic control for   <7.0% adults with diabetes    Mean Plasma Glucose 07/29/2020 114.02  mg/dL Final   Performed at [facility], [address] [place], [place] [address]     X-Rays:No results found.  EKG: Orders placed or performed during the hospital encounter of 07/29/20   EKG 12 lead per protocol   EKG 12 lead per protocol     Hospital Course: [place] is a 75 y.o. who was admitted to [facility]. They were brought to the operating room on 08/11/2020 and underwent Procedure(s): TOTAL KNEE ARTHROPLASTY.  Patient tolerated the procedure well and was later transferred to the recovery room and then to the orthopaedic floor for postoperative care. They were given PO and IV analgesics for pain control following their surgery. They were given 24 hours of postoperative antibiotics of  Anti-infectives (From admission, onward)    Start     Dose/Rate Route Frequency Ordered Stop   08/12/20 0000  ceFAZolin (ANCEF) IVPB 2g/100 mL premix        2 g 200 mL/hr over 30 Minutes Intravenous Every 6 hours 08/11/20 1916 08/12/20 0003   08/11/20 1800  ceFAZolin (ANCEF) 2 g in sodium chloride 0.9 % 100 mL IVPB  Status:  Discontinued        2 g 200 mL/hr over 30 Minutes Intravenous Every 6 hours 08/11/20 1555 08/11/20 1606   08/11/20 1800  ceFAZolin (ANCEF) IVPB 2g/100 mL premix  Status:  Discontinued        2 g 200 mL/hr over  30 Minutes Intravenous Every 6 hours 08/11/20 1607 08/11/20 1916   08/11/20 0915  ceFAZolin (ANCEF) 2 g in sodium chloride 0.9 % 100 mL IVPB        2 g 200 mL/hr over 30 Minutes Intravenous On call to O.R. 08/11/20 0908 08/11/20 1134      and started on DVT prophylaxis in the form of  Eliquis .   PT and OT were ordered for total joint protocol. Discharge planning consulted to help with postop disposition and equipment needs. Patient had an uneventful night on the evening of surgery. They started to get up OOB  with therapy on 08/12/2020. Continued to work with therapy into POD #2. Pt was seen during rounds on day two and was ready to go home pending progress with therapy. Pt worked with therapy for three additional sessionsand was meeting their goals. She was discharged to home later that day in stable condition.  Diet: Regular diet Activity: WBAT Follow-up: in two weeks Disposition: Home Discharged Condition: good   Discharge Instructions     Call MD / Call 911   Complete by: As directed    If you experience chest pain or shortness of breath, CALL 911 and be transported to the hospital emergency room.  If you develope a fever above 101 F, pus (white drainage) or increased drainage or redness at the wound, or calf pain, call your surgeon's office.   Call MD / Call 911   Complete by: As directed    If you experience chest pain or shortness of breath, CALL 911 and be transported to the hospital emergency room.  If you develope a fever above 101 F, pus (white drainage) or increased drainage or redness at the wound, or calf pain, call your surgeon's office.   Change dressing   Complete by: As directed    You may remove the bulky bandage (ACE wrap and gauze) two days after surgery. You will have an adhesive waterproof bandage underneath. Leave this in place until your first follow-up appointment.   Change dressing   Complete by: As directed    You may remove the bulky bandage (ACE wrap and gauze)  two days after surgery. You will have an adhesive waterproof bandage underneath. Leave this in place until your first follow-up appointment.   Constipation Prevention   Complete by: As directed    Drink plenty of fluids.  Prune juice may be helpful.  You may use a stool softener, such as Colace (over the counter) 100 mg twice a day.  Use MiraLax (over the counter) for constipation as needed.   Constipation Prevention   Complete by: As directed    Drink plenty of fluids.  Prune juice may be helpful.  You may use a stool softener, such as Colace (over the counter) 100 mg twice a day.  Use MiraLax (over the counter) for constipation as needed.   Diet - low sodium heart healthy   Complete by: As directed    Diet - low sodium heart healthy   Complete by: As directed    Do not put a pillow under the knee. Place it under the heel.   Complete by: As directed    Do not put a pillow under the knee. Place it under the heel.   Complete by: As directed    Driving restrictions   Complete by: As directed    No driving for two weeks   Driving restrictions   Complete by: As directed    No driving for two weeks   Post-operative opioid taper instructions:   Complete by: As directed    POST-OPERATIVE OPIOID TAPER INSTRUCTIONS: It is important to wean off of your opioid medication as soon as possible. If you do not need pain medication after your surgery it is ok to stop day one. Opioids include: Codeine, Hydrocodone(Norco, Vicodin), Oxycodone(Percocet, oxycontin) and hydromorphone amongst others.  Long term and even short term use of opiods can cause: Increased pain response Dependence Constipation Depression Respiratory depression And more.  Withdrawal symptoms can include Flu like symptoms Nausea, vomiting And more Techniques to manage these symptoms Hydrate  well Eat regular healthy meals Stay active Use relaxation techniques(deep breathing, meditating, yoga) Do Not substitute Alcohol to  help with tapering If you have been on opioids for less than two weeks and do not have pain than it is ok to stop all together.  Plan to wean off of opioids This plan should start within one week post op of your joint replacement. Maintain the same interval or time between taking each dose and first decrease the dose.  Cut the total daily intake of opioids by one tablet each day Next start to increase the time between doses. The last dose that should be eliminated is the evening dose.      Post-operative opioid taper instructions:   Complete by: As directed    POST-OPERATIVE OPIOID TAPER INSTRUCTIONS: It is important to wean off of your opioid medication as soon as possible. If you do not need pain medication after your surgery it is ok to stop day one. Opioids include: Codeine, Hydrocodone(Norco, Vicodin), Oxycodone(Percocet, oxycontin) and hydromorphone amongst others.  Long term and even short term use of opiods can cause: Increased pain response Dependence Constipation Depression Respiratory depression And more.  Withdrawal symptoms can include Flu like symptoms Nausea, vomiting And more Techniques to manage these symptoms Hydrate well Eat regular healthy meals Stay active Use relaxation techniques(deep breathing, meditating, yoga) Do Not substitute Alcohol to help with tapering If you have been on opioids for less than two weeks and do not have pain than it is ok to stop all together.  Plan to wean off of opioids This plan should start within one week post op of your joint replacement. Maintain the same interval or time between taking each dose and first decrease the dose.  Cut the total daily intake of opioids by one tablet each day Next start to increase the time between doses. The last dose that should be eliminated is the evening dose.      TED hose   Complete by: As directed    Use stockings (TED hose) for three weeks on both leg(s).  You may remove them at night  for sleeping.   TED hose   Complete by: As directed    Use stockings (TED hose) for three weeks on both leg(s).  You may remove them at night for sleeping.   Weight bearing as tolerated   Complete by: As directed    Weight bearing as tolerated   Complete by: As directed       Allergies as of 08/14/2020       Reactions   Codeine Diarrhea, Nausea And Vomiting   Other Itching   Pt sensitive to adhesives to skin .         Medication List     TAKE these medications    amLODipine 10 MG tablet Commonly known as: NORVASC Take 5 mg by mouth in the morning.   apixaban 2.5 MG Tabs tablet Commonly known as: ELIQUIS Take 1 tablet (2.5 mg total) by mouth every 12 (twelve) hours for 20 days. Then take one 81 mg aspirin once a day for three weeks. Then discontinue aspirin.   atorvastatin 20 MG tablet Commonly known as: LIPITOR Take 20 mg by mouth every evening.   carvedilol 6.25 MG tablet Commonly known as: COREG Take 6.25 mg by mouth 2 (two) times daily.   gabapentin 300 MG capsule Commonly known as: NEURONTIN Take a 300 mg capsule three times a day for two weeks following surgery.Then take a 300 mg  capsule two times a day for two weeks. Then take a 300 mg capsule once a day for two weeks. Then discontinue.   HYDROmorphone 2 MG tablet Commonly known as: DILAUDID Take 1-2 tablets (2-4 mg total) by mouth every 6 (six) hours as needed for severe pain. Not to exceed 6 tablets a day   latanoprost 0.005 % ophthalmic solution Commonly known as: XALATAN Place 1 drop into both eyes at bedtime.   losartan-hydrochlorothiazide 100-12.5 MG tablet Commonly known as: HYZAAR Take 1 tablet by mouth in the morning.   methocarbamol 500 MG tablet Commonly known as: ROBAXIN Take 1 tablet (500 mg total) by mouth every 6 (six) hours as needed for muscle spasms.   timolol 0.5 % ophthalmic solution Commonly known as: TIMOPTIC Place 1 drop into both eyes in the morning.   traMADol 50 MG  tablet Commonly known as: ULTRAM Take 1-2 tablets (50-100 mg total) by mouth every 6 (six) hours as needed for moderate pain.               Discharge Care Instructions  (From admission, onward)           Start     Ordered   08/14/20 0000  Weight bearing as tolerated        08/14/20 0756   08/14/20 0000  Change dressing       Comments: You may remove the bulky bandage (ACE wrap and gauze) two days after surgery. You will have an adhesive waterproof bandage underneath. Leave this in place until your first follow-up appointment.   08/14/20 0756   08/12/20 0000  Weight bearing as tolerated        08/12/20 0715   08/12/20 0000  Change dressing       Comments: You may remove the bulky bandage (ACE wrap and gauze) two days after surgery. You will have an adhesive waterproof bandage underneath. Leave this in place until your first follow-up appointment.   08/12/20 0715            Follow-up Information     Ollen GrossAluisio, Frank, MD Follow up.   Specialty: Orthopedic Surgery Why: You are scheduled for an appointment on 08/26/2020 at 1:30 pm Contact information: [address] [place] [id number] [phone]                 Signed: Nelia ShiSean Pema Thomure, MBA, PA-C Orthopedic Surgery 08/28/2020, 2:05 PM

## 2020-09-02 ENCOUNTER — Other Ambulatory Visit: Payer: Self-pay

## 2020-09-02 ENCOUNTER — Encounter: Payer: Self-pay | Admitting: Physical Therapy

## 2020-09-02 ENCOUNTER — Ambulatory Visit: Payer: Medicare Other | Admitting: Physical Therapy

## 2020-09-02 DIAGNOSIS — M25661 Stiffness of right knee, not elsewhere classified: Secondary | ICD-10-CM | POA: Diagnosis not present

## 2020-09-02 DIAGNOSIS — M25562 Pain in left knee: Secondary | ICD-10-CM

## 2020-09-02 DIAGNOSIS — R269 Unspecified abnormalities of gait and mobility: Secondary | ICD-10-CM

## 2020-09-02 DIAGNOSIS — G8929 Other chronic pain: Secondary | ICD-10-CM

## 2020-09-02 DIAGNOSIS — M6281 Muscle weakness (generalized): Secondary | ICD-10-CM

## 2020-09-02 NOTE — Addendum Note (Signed)
Addendum  created 09/02/20 1950 by Lewie Loron, MD   Clinical Note Signed, Intraprocedure Blocks edited, SmartForm saved

## 2020-09-02 NOTE — Therapy (Signed)
[facility] [address] [place], [place], [address] Phone: [phone]   Fax:  [phone]  Physical Therapy Treatment  Patient Details  Name: Joanne Lewis MRN: [id number] Date of Birth: Mar 25, 1945 Referring Provider (PT): Alyssa Grove, [place]   Encounter Date: 09/02/2020   PT End of Session - 09/02/20 1117     Visit Number 5    Number of Visits 16    Date for PT Re-Evaluation 10/14/20    Authorization Type Traditional medicare A&B    Authorization - Visit Number 5    Authorization - Number of Visits 10    PT Start Time 1114    PT Stop Time 1159    PT Time Calculation (min) 45 min    Equipment Utilized During Treatment Gait belt    Activity Tolerance Patient tolerated treatment well;Patient limited by fatigue    Behavior During Therapy [facility] for tasks assessed/performed;Anxious   Pt displays mod anxious during eval about the entire TKA process.            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HCC)    Arthritis    Branch retinal artery occlusion, left eye    Chronic kidney disease    moderate   Fatigue    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 11/25/2017   Hyperkalemia    Hyperlipidemia    Hypertension    Pneumonia    at least 10 years ago   Pre-diabetes     Past Surgical History:  Procedure Laterality Date   COLONOSCOPY WITH PROPOFOL N/A 08/09/2016   Procedure: COLONOSCOPY WITH PROPOFOL;  Surgeon: Scot Jun, MD;  Location: [facility] ENDOSCOPY;  Service: Endoscopy;  Laterality: N/A;   DILATATION & CURETTAGE/HYSTEROSCOPY WITH MYOSURE N/A 02/20/2018   Procedure: DILATATION & CURETTAGE/HYSTEROSCOPY WITH MYOSURE;  Surgeon: Schermerhorn, Ihor Austin, MD;  Location: [facility] ORS;  Service: Gynecology;  Laterality: N/A;   DILATION AND CURETTAGE OF UTERUS N/A 02/20/2018   Procedure: DILATATION AND CURETTAGE, fractional, resection of endometrial mass, possible  myosure, hysteroscopy;  Surgeon: Schermerhorn, Ihor Austin, MD;  Location: [facility] ORS;  Service: Gynecology;  Laterality: N/A;   FEMORAL HERNIA REPAIR Right 11/26/2017   Procedure: HERNIA REPAIR FEMORAL with insertion or mesh;  Surgeon: Ancil Linsey, MD;  Location: [facility] ORS;  Service: General;  Laterality: Right;   HYSTEROSCOPY WITH D & C N/A 02/20/2018   Procedure: DILATATION AND CURETTAGE /HYSTEROSCOPY,;  Surgeon: Schermerhorn, Ihor Austin, MD;  Location: [facility] ORS;  Service: Gynecology;  Laterality: N/A;   TOTAL KNEE ARTHROPLASTY Left 08/11/2020   Procedure: TOTAL KNEE ARTHROPLASTY;  Surgeon: Ollen Gross, MD;  Location: [facility] ORS;  Service: Orthopedics;  Laterality: Left;    There were no vitals filed for this visit.   Subjective Assessment - 09/02/20 1117     Subjective Pt drove to PT for the first time today.  Pt. reports her ride did not show up so she felt safe driving independently.  Pt stated only taking Tramadol before tx due to driving, she normal takes the hydromorphone before tx. Pt states 0/10 knee at the start of tx. Pt states that she had a good weekend without complications.    Pertinent History Pt is currently amb with FWW during home and community amb. Pt requires assist with transportation at this time. [place]. Pt has prolonged history of bilat knee pain 2/2 OA  with accompanying heel pain.    Limitations Walking;Lifting;Standing;House hold activities;Other (comment)   Walking on the beach.   How long can you sit comfortably? WNL    How long can you stand comfortably? 10 mins    How long can you walk comfortably? 120 ft with FWW    Diagnostic tests n/a    Patient Stated Goals Return to walking on the beach Enjoy life without having to need seated breaks.    Currently in Pain? No/denies    Pain Score 0-No pain               Therapeutic Exercise:   NuStep L5 with 85mins at seat position 11 to promote knee ext  and 5 mins seat 7 to promote increase in L knee flexion ROM.  SpO2 95%, HR 81      Ambulate in hallway and outside with use of SPC working on increase step pattern/ L heel strike during reciprocal gait pattern.    Seated LAQ 3x10 bilat with verbal cueing to ensure movement at the knee and not the hip 4# ankle weights bilat.    //Bars: with occ verbal to facilitate high quality movement and optimal joint and tissue loading.  1) Marching x 3 laps  2) walking hamstring curls x 3 laps  3) Standing hip abd with 4# ankle weights x 30 each side    Supine single leg ball curl with therapist assit and moderate  5 sec OP at end range x30  Quad set with therapist OP 10 sec x 30 with heel on yoga block.    Will assess incision/ scar healing next tx. Unable today due to pt pants not allowing access.       PT Education - 09/02/20 1212     Education Details Pt ed provied on proper squence of SPC gait.    Person(s) Educated Patient    Methods Explanation;Demonstration;Verbal cues    Comprehension Verbalized understanding;Need further instruction                 PT Long Term Goals - 08/26/20 1255       PT LONG TERM GOAL #1   Title Pt will increase FOTO score to 72 to promote pt report return to independence with ADLs    Baseline 67    Time 8    Period Weeks    Status New    Target Date 10/12/20      PT LONG TERM GOAL #2   Title Pt will increase LLE MMT equal to right to promote return to high level gait distances.    Baseline R/L  4/4 Hip flexion  5/5 Hip abduction seated  5/5 Hip adduction seated  5/3 Knee extension  5/3 Knee flexion 5/5 Ankle dorsiflexion  5/5 Ankle plantarflexion *no resistance applied    Time 8    Period Weeks    Status New    Target Date 10/12/20      PT LONG TERM GOAL #3   Title Pt will displayed reduced swelling in LLE so that circumference measures equal the RLE to promote increase ROM and normalized gait.    Baseline R/L 5cm above superior patellar  pole 48/54, R/L mid patella 44/50, R/L tibial tuberosity: 39/42    Time 8    Period Weeks    Status New    Target Date 10/12/20      PT LONG TERM GOAL #4   Title PT will improve L knee ROM equal to R for  decreased crouched gait during standing and gait activities    Baseline Knee Flexion (0-135): R:105 L:90 (95 PROM)  Knee Extension (0): R: +15 L: +20    Time 8    Period Weeks    Status New    Target Date 10/12/20      PT LONG TERM GOAL #5   Title PT will be able to amb 1064ft  without AD to promote return amb on the beach during leisure visits    Baseline 300 ft with FWW SBA    Time 8    Period Weeks    Status New    Target Date 10/12/20      Additional Long Term Goals   Additional Long Term Goals Yes      PT LONG TERM GOAL #6   Title Pt will improve 5xSTS by at least 3 seconds to promote increased LE power during transfers and gait.    Baseline 8/16 19.97    Time 8    Period Weeks    Status New    Target Date 10/12/20                   Plan - 09/02/20 1211     Clinical Impression Statement Today's tx was focused on loading the L knee to promote flexion/ext during WB and non-WB position. Pt was able to tolerate all progression of tx without increase in L knee pain. Will assess incision/ scar healing next tx. Unable today due to pt pants not allowing access. Pt continues to display needed use of AD during amb, knee flexion/ext deficits, and crouched gait. Pt. will continue to benefit from skilled physical therapy to progress POC to address remaining deficits to facilitate maximum functional capacity for optimal personal health and wellness for ADLs.    Personal Factors and Comorbidities Social Background;Transportation    Examination-Activity Limitations Bend;Stairs;Stand;Squat;Locomotion Level    Stability/Clinical Decision Making Evolving/Moderate complexity    Clinical Decision Making Moderate    Rehab Potential Good    PT Frequency 2x / week    PT Duration 8  weeks    PT Treatment/Interventions ADLs/Self Care Home Management;Aquatic Therapy;Cryotherapy;Moist Heat;Gait training;DME Instruction;Neuromuscular re-education;Balance training;Therapeutic exercise;Therapeutic activities;Functional mobility training;Stair training;Patient/family education;Manual techniques;Passive range of motion;Biofeedback;Electrical Stimulation;Traction;Dry needling;Energy conservation;Taping;Joint Manipulations;Other (comment);Spinal Manipulations    PT Next Visit Plan Reassess gait distance and scar healing.    PT Home Exercise Plan Surgical office provided/ check incision healing.    Consulted and Agree with Plan of Care Patient             Patient will benefit from skilled therapeutic intervention in order to improve the following deficits and impairments:  Abnormal gait, Decreased range of motion, Difficulty walking, Impaired tone, Decreased endurance, Obesity, Pain, Decreased activity tolerance, Decreased balance, Hypomobility, Impaired flexibility, Improper body mechanics, Postural dysfunction, Decreased mobility, Decreased strength, Decreased skin integrity, Increased edema  Visit Diagnosis: Knee joint stiffness, bilateral  Acute pain of left knee  Chronic pain of both knees  Muscle weakness (generalized)  Gait difficulty     Problem List Patient Active Problem List   Diagnosis Date Noted   Osteoarthritis of knee 08/13/2020   OA (osteoarthritis) of knee 08/11/2020   Primary osteoarthritis of left knee 08/11/2020   SOB (shortness of breath) 10/10/2017   Coronary artery calcification 09/19/2016   Chest pain 02/03/2016   Elevated troponin 02/03/2016   Anxiety 02/03/2016   Essential hypertension 02/03/2016   Mixed hyperlipidemia 02/03/2016   Chest tightness 01/01/2016   Casimiro Needle  Fuller Song, PT, DPT # [id number] Lawernce Ion SPT 09/02/2020, 4:27 PM  [facility] [address] [place], [place],  [address] Phone: [phone]   Fax:  [phone]  Name: Joanne Lewis MRN: [id number] Date of Birth: Jul 10, 1945

## 2020-09-04 ENCOUNTER — Encounter: Payer: Self-pay | Admitting: Physical Therapy

## 2020-09-04 ENCOUNTER — Other Ambulatory Visit: Payer: Self-pay

## 2020-09-04 ENCOUNTER — Ambulatory Visit: Payer: Medicare Other | Admitting: Physical Therapy

## 2020-09-04 DIAGNOSIS — M25661 Stiffness of right knee, not elsewhere classified: Secondary | ICD-10-CM

## 2020-09-04 DIAGNOSIS — G8929 Other chronic pain: Secondary | ICD-10-CM

## 2020-09-04 DIAGNOSIS — M25562 Pain in left knee: Secondary | ICD-10-CM

## 2020-09-04 DIAGNOSIS — R269 Unspecified abnormalities of gait and mobility: Secondary | ICD-10-CM

## 2020-09-04 DIAGNOSIS — M25662 Stiffness of left knee, not elsewhere classified: Secondary | ICD-10-CM

## 2020-09-04 DIAGNOSIS — M6281 Muscle weakness (generalized): Secondary | ICD-10-CM

## 2020-09-04 NOTE — Therapy (Signed)
[facility] [address] [place], [place], [address] Phone: [phone]   Fax:  [phone]  Physical Therapy Treatment  Patient Details  Name: Joanne Lewis MRN: [id number] Date of Birth: [birth year] Referring Provider (PT): Alyssa Grove, [place]   Encounter Date: 09/04/2020   PT End of Session - 09/04/20 1128     Visit Number 6    Number of Visits 16    Date for PT Re-Evaluation 10/14/20    Authorization Type Traditional medicare A&B    Authorization Time Period 05/14/20-07/09/20    Authorization - Visit Number 6    Authorization - Number of Visits 10    PT Start Time 1116    PT Stop Time 1205    PT Time Calculation (min) 49 min    Equipment Utilized During Treatment Gait belt    Activity Tolerance Patient tolerated treatment well;Patient limited by fatigue    Behavior During Therapy [facility] for tasks assessed/performed;Anxious   Pt displays mod anxious during eval about the entire TKA process.            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HCC)    Arthritis    Branch retinal artery occlusion, left eye    Chronic kidney disease    moderate   Fatigue    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 11/25/2017   Hyperkalemia    Hyperlipidemia    Hypertension    Pneumonia    at least 10 years ago   Pre-diabetes     Past Surgical History:  Procedure Laterality Date   COLONOSCOPY WITH PROPOFOL N/A 08/09/2016   Procedure: COLONOSCOPY WITH PROPOFOL;  Surgeon: Scot Jun, MD;  Location: [facility] ENDOSCOPY;  Service: Endoscopy;  Laterality: N/A;   DILATATION & CURETTAGE/HYSTEROSCOPY WITH MYOSURE N/A 02/20/2018   Procedure: DILATATION & CURETTAGE/HYSTEROSCOPY WITH MYOSURE;  Surgeon: Schermerhorn, Ihor Austin, MD;  Location: [facility] ORS;  Service: Gynecology;  Laterality: N/A;   DILATION AND CURETTAGE OF UTERUS N/A 02/20/2018   Procedure: DILATATION AND CURETTAGE, fractional,  resection of endometrial mass, possible myosure, hysteroscopy;  Surgeon: Schermerhorn, Ihor Austin, MD;  Location: [facility] ORS;  Service: Gynecology;  Laterality: N/A;   FEMORAL HERNIA REPAIR Right 11/26/2017   Procedure: HERNIA REPAIR FEMORAL with insertion or mesh;  Surgeon: Ancil Linsey, MD;  Location: [facility] ORS;  Service: General;  Laterality: Right;   HYSTEROSCOPY WITH D & C N/A 02/20/2018   Procedure: DILATATION AND CURETTAGE /HYSTEROSCOPY,;  Surgeon: Schermerhorn, Ihor Austin, MD;  Location: [facility] ORS;  Service: Gynecology;  Laterality: N/A;   TOTAL KNEE ARTHROPLASTY Left 08/11/2020   Procedure: TOTAL KNEE ARTHROPLASTY;  Surgeon: Ollen Gross, MD;  Location: [facility];  Service: Orthopedics;  Laterality: Left;    There were no vitals filed for this visit.   Subjective Assessment - 09/04/20 1121     Subjective Pt present to tx with 0.5/10 pain in the back of the L knee. Pt stated that she has not taken any pain medication in the last 24 hours.  Pt. stated that she had severe itching in the back of the knee on tuesday night which led her to take a tramadol. Pt has not experienced any additional itching.    Pertinent History Pt is currently amb with FWW during home and community amb. Pt requires assist with transportation at this time. [place] and [place]. Pt has prolonged history  of bilat knee pain 2/2 OA with accompanying heel pain.    Limitations Walking;Lifting;Standing;House hold activities;Other (comment)   Walking on the beach.   How long can you sit comfortably? WNL    How long can you stand comfortably? 10 mins    How long can you walk comfortably? 120 ft with FWW    Diagnostic tests n/a    Patient Stated Goals Return to walking on the beach Enjoy life without having to need seated breaks.    Currently in Pain? Yes    Pain Score 1     Pain Location Knee    Pain Orientation Left    Pain Descriptors / Indicators Aching;Sore    Pain Type  Surgical pain             Incision assessment:  Surgical incision display proper healing proprieties and without signs of infection.    Therapeutic Exercise:   NuStep L5 with at seat position 11 to promote knee ext and 5 mins seat 7 to promote increase in L knee flexion ROM.    Ambulate in hallway and outside with use of SPC working on increase step pattern/ L heel strike during reciprocal gait pattern. 300 feet with occ verbal cueing to correct sequence of SPC.     Seated LAQ and hip marching 3x10 bilat with verbal cueing to ensure movement at the knee and not the hip 4# ankle weights bilat.     //Bars: with occ verbal to facilitate high quality movement and optimal joint and tissue loading. 4# ankle weights bilat.  1) Marching x 3 laps  2) standing hamstring curls with chair to isolate knee flexion x30 each leg.       Supine single leg ball curl with therapist assit and moderate  5 sec OP at end range x30. Therapist OP into ext between each curl.       PT Education - 09/04/20 1127     Education Details Pt was educated on proper SPC gait height.    Person(s) Educated Patient    Methods Explanation;Demonstration;Verbal cues;Tactile cues    Comprehension Verbalized understanding                 PT Long Term Goals - 08/26/20 1255       PT LONG TERM GOAL #1   Title Pt will increase FOTO score to 72 to promote pt report return to independence with ADLs    Baseline 67    Time 8    Period Weeks    Status New    Target Date 10/12/20      PT LONG TERM GOAL #2   Title Pt will increase LLE MMT equal to right to promote return to high level gait distances.    Baseline R/L  4/4 Hip flexion  5/5 Hip abduction seated  5/5 Hip adduction seated  5/3 Knee extension  5/3 Knee flexion 5/5 Ankle dorsiflexion  5/5 Ankle plantarflexion *no resistance applied    Time 8    Period Weeks    Status New    Target Date 10/12/20      PT LONG TERM GOAL #3   Title Pt will  displayed reduced swelling in LLE so that circumference measures equal the RLE to promote increase ROM and normalized gait.    Baseline R/L 5cm above superior patellar pole 48/54, R/L mid patella 44/50, R/L tibial tuberosity: 39/42    Time 8    Period Weeks    Status New  Target Date 10/12/20      PT LONG TERM GOAL #4   Title PT will improve L knee ROM equal to R for decreased crouched gait during standing and gait activities    Baseline Knee Flexion (0-135): R:105 L:90 (95 PROM)  Knee Extension (0): R: +15 L: +20    Time 8    Period Weeks    Status New    Target Date 10/12/20      PT LONG TERM GOAL #5   Title PT will be able to amb 107400ft  without AD to promote return amb on the beach during leisure visits    Baseline 300 ft with FWW SBA    Time 8    Period Weeks    Status New    Target Date 10/12/20      Additional Long Term Goals   Additional Long Term Goals Yes      PT LONG TERM GOAL #6   Title Pt will improve 5xSTS by at least 3 seconds to promote increased LE power during transfers and gait.    Baseline 8/16 19.97    Time 8    Period Weeks    Status New    Target Date 10/12/20                Plan - 09/04/20 1128     Clinical Impression Statement Pt tolerated progression of tx with continued gait distance with SPC and fewer verbal cueing and progression of therex with 4# ankle weights. Pt stated no increase in pain at the end of tx. Pt continues to display mark LE endurance, strength, and ROM deficits resulting in decreased safe home/community mobiity, increased pain, and limited access to QOL. Pt. will continue to benefit from skilled physical therapy to progress POC to address remaining deficits to facilitate maximum functional capacity for optimal personal health and wellness for ADLs.    Personal Factors and Comorbidities Social Background;Transportation    Examination-Activity Limitations Bend;Stairs;Stand;Squat;Locomotion Level    Stability/Clinical  Decision Making Evolving/Moderate complexity    Clinical Decision Making Moderate    Rehab Potential Good    PT Frequency 2x / week    PT Duration 8 weeks    PT Treatment/Interventions ADLs/Self Care Home Management;Aquatic Therapy;Cryotherapy;Moist Heat;Gait training;DME Instruction;Neuromuscular re-education;Balance training;Therapeutic exercise;Therapeutic activities;Functional mobility training;Stair training;Patient/family education;Manual techniques;Passive range of motion;Biofeedback;Electrical Stimulation;Traction;Dry needling;Energy conservation;Taping;Joint Manipulations;Other (comment);Spinal Manipulations    PT Next Visit Plan Reassess gait distance and scar healing.    PT Home Exercise Plan Surgical office provided/ check incision healing.    Consulted and Agree with Plan of Care Patient             Patient will benefit from skilled therapeutic intervention in order to improve the following deficits and impairments:  Abnormal gait, Decreased range of motion, Difficulty walking, Impaired tone, Decreased endurance, Obesity, Pain, Decreased activity tolerance, Decreased balance, Hypomobility, Impaired flexibility, Improper body mechanics, Postural dysfunction, Decreased mobility, Decreased strength, Decreased skin integrity, Increased edema  Visit Diagnosis: Knee joint stiffness, bilateral  Acute pain of left knee  Muscle weakness (generalized)  Chronic pain of both knees  Gait difficulty     Problem List Patient Active Problem List   Diagnosis Date Noted   Osteoarthritis of knee 08/13/2020   OA (osteoarthritis) of knee 08/11/2020   Primary osteoarthritis of left knee 08/11/2020   SOB (shortness of breath) 10/10/2017   Coronary artery calcification 09/19/2016   Chest pain 02/03/2016   Elevated troponin 02/03/2016   Anxiety 02/03/2016  Essential hypertension 02/03/2016   Mixed hyperlipidemia 02/03/2016   Chest tightness 01/01/2016   Cammie Mcgee, PT, DPT #  [id number] Lawernce Ion SPT 09/04/2020, 1:00 PM  [facility] [address] [place], [place], [address] Phone: [phone]   Fax:  [phone]  Name: Joanne Lewis MRN: [id number] Date of Birth: [birth year]

## 2020-09-09 ENCOUNTER — Other Ambulatory Visit: Payer: Self-pay

## 2020-09-09 ENCOUNTER — Ambulatory Visit: Payer: Medicare Other | Admitting: Physical Therapy

## 2020-09-09 ENCOUNTER — Encounter: Payer: Self-pay | Admitting: Physical Therapy

## 2020-09-09 DIAGNOSIS — M25661 Stiffness of right knee, not elsewhere classified: Secondary | ICD-10-CM

## 2020-09-09 DIAGNOSIS — M25562 Pain in left knee: Secondary | ICD-10-CM

## 2020-09-09 DIAGNOSIS — R269 Unspecified abnormalities of gait and mobility: Secondary | ICD-10-CM

## 2020-09-09 DIAGNOSIS — M6281 Muscle weakness (generalized): Secondary | ICD-10-CM

## 2020-09-09 NOTE — Therapy (Signed)
[facility] [address] [place], [place], [address] Phone: [phone]   Fax:  [phone]  Physical Therapy Treatment  Patient Details  Name: Joanne Lewis MRN: [id number] Date of Birth: 1945-03-08 Referring Provider (PT): Alyssa Grove, [place]   Encounter Date: 09/09/2020   PT End of Session - 09/09/20 1123     Visit Number 7    Number of Visits 16    Date for PT Re-Evaluation 10/14/20    Authorization Type Traditional medicare A&B    Authorization Time Period 05/14/20-07/09/20    Authorization - Visit Number 7    Authorization - Number of Visits 10    PT Start Time 1112    PT Stop Time 1159    PT Time Calculation (min) 47 min    Equipment Utilized During Treatment Gait belt    Activity Tolerance Patient tolerated treatment well;Patient limited by fatigue    Behavior During Therapy [facility] for tasks assessed/performed;Anxious   Pt displays mod anxious during eval about the entire TKA process.            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HCC)    Arthritis    Branch retinal artery occlusion, left eye    Chronic kidney disease    moderate   Fatigue    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 11/25/2017   Hyperkalemia    Hyperlipidemia    Hypertension    Pneumonia    at least 10 years ago   Pre-diabetes     Past Surgical History:  Procedure Laterality Date   COLONOSCOPY WITH PROPOFOL N/A 08/09/2016   Procedure: COLONOSCOPY WITH PROPOFOL;  Surgeon: Scot Jun, MD;  Location: [facility] ENDOSCOPY;  Service: Endoscopy;  Laterality: N/A;   DILATATION & CURETTAGE/HYSTEROSCOPY WITH MYOSURE N/A 02/20/2018   Procedure: DILATATION & CURETTAGE/HYSTEROSCOPY WITH MYOSURE;  Surgeon: Schermerhorn, Ihor Austin, MD;  Location: [facility] ORS;  Service: Gynecology;  Laterality: N/A;   DILATION AND CURETTAGE OF UTERUS N/A 02/20/2018   Procedure: DILATATION AND CURETTAGE, fractional,  resection of endometrial mass, possible myosure, hysteroscopy;  Surgeon: Schermerhorn, Ihor Austin, MD;  Location: [facility] ORS;  Service: Gynecology;  Laterality: N/A;   FEMORAL HERNIA REPAIR Right 11/26/2017   Procedure: HERNIA REPAIR FEMORAL with insertion or mesh;  Surgeon: Ancil Linsey, MD;  Location: [facility] ORS;  Service: General;  Laterality: Right;   HYSTEROSCOPY WITH D & C N/A 02/20/2018   Procedure: DILATATION AND CURETTAGE /HYSTEROSCOPY,;  Surgeon: Schermerhorn, Ihor Austin, MD;  Location: [facility] ORS;  Service: Gynecology;  Laterality: N/A;   TOTAL KNEE ARTHROPLASTY Left 08/11/2020   Procedure: TOTAL KNEE ARTHROPLASTY;  Surgeon: Ollen Gross, MD;  Location: [facility] ORS;  Service: Orthopedics;  Laterality: Left;    There were no vitals filed for this visit.   Subjective Assessment - 09/09/20 1117     Subjective Pt present to treatment with reports of decrease amb distance with SPC compared to FWW 2/2 bilat hip/ back pain. Pt states that her L leg feels longer then the R due to previous crouched gait. Pt continues to report 0.5/10 L knee pain at the start of tx withouth Tramadol. Pt returns to surgical office for check up 09/16/20.    Pertinent History Pt is currently amb with FWW during home and community amb. Pt requires assist with transportation at this time. [place] and [place]. Pt has prolonged history  of bilat knee pain 2/2 OA with accompanying heel pain.    Limitations Walking;Lifting;Standing;House hold activities;Other (comment)   Walking on the beach.   How long can you sit comfortably? WNL    How long can you stand comfortably? 10 mins    How long can you walk comfortably? 120 ft with FWW    Diagnostic tests n/a    Patient Stated Goals Return to walking on the beach Enjoy life without having to need seated breaks.    Currently in Pain? Yes    Pain Score 1     Pain Location Knee    Pain Orientation Left    Pain Descriptors / Indicators  Aching;Sore    Pain Type Surgical pain    Pain Frequency Intermittent              Therapeutic Exercise:   NuStep L5 with 10mins at seat position 11 to promote knee ext. Discussed sx response to last tx and adherence to gait with SPC.    Knee/hip flexion lunge on 12'' step. Bilat UE assist and CGA 2x10 each side with 5 sec hold.    Reciprocal toe taps on 12" steps with bilat UE assist and CGA 2x15 each side. Verbal cueing to eliminate L hip circumduction to clear the step.     Manual Therapy:  IASTIM via thera-roller with intermittent knee ext with 5-10 sec of moderate OP. 16 minutes. Pt display great satisfaction in reduction of post knee and hamstring tightness and discomfort post manual intervention.        PT Education - 09/09/20 1121     Education Details Pt was educated on proper form and technique for optimal tissue loading during tx.    Person(s) Educated Patient    Methods Demonstration;Tactile cues;Verbal cues;Explanation    Comprehension Verbalized understanding                 PT Long Term Goals - 08/26/20 1255       PT LONG TERM GOAL #1   Title Pt will increase FOTO score to 72 to promote pt report return to independence with ADLs    Baseline 67    Time 8    Period Weeks    Status New    Target Date 10/12/20      PT LONG TERM GOAL #2   Title Pt will increase LLE MMT equal to right to promote return to high level gait distances.    Baseline R/L  4/4 Hip flexion  5/5 Hip abduction seated  5/5 Hip adduction seated  5/3 Knee extension  5/3 Knee flexion 5/5 Ankle dorsiflexion  5/5 Ankle plantarflexion *no resistance applied    Time 8    Period Weeks    Status New    Target Date 10/12/20      PT LONG TERM GOAL #3   Title Pt will displayed reduced swelling in LLE so that circumference measures equal the RLE to promote increase ROM and normalized gait.    Baseline R/L 5cm above superior patellar pole 48/54, R/L mid patella 44/50, R/L tibial  tuberosity: 39/42    Time 8    Period Weeks    Status New    Target Date 10/12/20      PT LONG TERM GOAL #4   Title PT will improve L knee ROM equal to R for decreased crouched gait during standing and gait activities    Baseline Knee Flexion (0-135): R:105 L:90 (95 PROM)  Knee Extension (0): R: +  15 L: +20    Time 8    Period Weeks    Status New    Target Date 10/12/20      PT LONG TERM GOAL #5   Title PT will be able to amb 1091ft  without AD to promote return amb on the beach during leisure visits    Baseline 300 ft with FWW SBA    Time 8    Period Weeks    Status New    Target Date 10/12/20      Additional Long Term Goals   Additional Long Term Goals Yes      PT LONG TERM GOAL #6   Title Pt will improve 5xSTS by at least 3 seconds to promote increased LE power during transfers and gait.    Baseline 8/16 19.97    Time 8    Period Weeks    Status New    Target Date 10/12/20                Plan - 09/09/20 1123     Clinical Impression Statement Today's tx focus was on promoting knee flexion during WB. Pt required verbal cueing for optimal holds during lunges and proper LE alignment during 12'' toe taps. Pt displays great satisfaction in reduction of post knee and hamstring tightness and discomfort post manual intervention. Pt continues to display mark LE endurance, strength, and ROM deficits resulting in decreased safe home/community mobiity, increased pain, and limited access to QOL. Pt. will continue to benefit from skilled physical therapy to progress POC to address remaining deficits to facilitate maximum functional capacity for optimal personal health and wellness for ADLs.    Personal Factors and Comorbidities Social Background;Transportation    Examination-Activity Limitations Bend;Stairs;Stand;Squat;Locomotion Level    Stability/Clinical Decision Making Evolving/Moderate complexity    Clinical Decision Making Moderate    Rehab Potential Good    PT Frequency 2x  / week    PT Duration 8 weeks    PT Treatment/Interventions ADLs/Self Care Home Management;Aquatic Therapy;Cryotherapy;Moist Heat;Gait training;DME Instruction;Neuromuscular re-education;Balance training;Therapeutic exercise;Therapeutic activities;Functional mobility training;Stair training;Patient/family education;Manual techniques;Passive range of motion;Biofeedback;Electrical Stimulation;Traction;Dry needling;Energy conservation;Taping;Joint Manipulations;Other (comment);Spinal Manipulations    PT Next Visit Plan Reassess L knee ROM    PT Home Exercise Plan Surgical office provided/ check incision healing.    Consulted and Agree with Plan of Care Patient             Patient will benefit from skilled therapeutic intervention in order to improve the following deficits and impairments:  Abnormal gait, Decreased range of motion, Difficulty walking, Impaired tone, Decreased endurance, Obesity, Pain, Decreased activity tolerance, Decreased balance, Hypomobility, Impaired flexibility, Improper body mechanics, Postural dysfunction, Decreased mobility, Decreased strength, Decreased skin integrity, Increased edema  Visit Diagnosis: Knee joint stiffness, bilateral  Acute pain of left knee  Muscle weakness (generalized)  Gait difficulty     Problem List Patient Active Problem List   Diagnosis Date Noted   Osteoarthritis of knee 08/13/2020   OA (osteoarthritis) of knee 08/11/2020   Primary osteoarthritis of left knee 08/11/2020   SOB (shortness of breath) 10/10/2017   Coronary artery calcification 09/19/2016   Chest pain 02/03/2016   Elevated troponin 02/03/2016   Anxiety 02/03/2016   Essential hypertension 02/03/2016   Mixed hyperlipidemia 02/03/2016   Chest tightness 01/01/2016   Cammie Mcgee, PT, DPT # [id number] Lawernce Ion SPT 09/09/2020, 12:33 PM  [facility] [address] [place], [place], [address] Phone: [phone]  Fax:   [phone]  Name: Joanne Lewis MRN: [id number] Date of Birth: [birth year]

## 2020-09-11 ENCOUNTER — Other Ambulatory Visit: Payer: Self-pay

## 2020-09-11 ENCOUNTER — Encounter: Payer: Self-pay | Admitting: Physical Therapy

## 2020-09-11 ENCOUNTER — Ambulatory Visit: Payer: Medicare Other | Attending: Internal Medicine | Admitting: Physical Therapy

## 2020-09-11 DIAGNOSIS — M79672 Pain in left foot: Secondary | ICD-10-CM | POA: Diagnosis present

## 2020-09-11 DIAGNOSIS — R269 Unspecified abnormalities of gait and mobility: Secondary | ICD-10-CM | POA: Insufficient documentation

## 2020-09-11 DIAGNOSIS — M25662 Stiffness of left knee, not elsewhere classified: Secondary | ICD-10-CM | POA: Insufficient documentation

## 2020-09-11 DIAGNOSIS — M25562 Pain in left knee: Secondary | ICD-10-CM | POA: Diagnosis present

## 2020-09-11 DIAGNOSIS — M6281 Muscle weakness (generalized): Secondary | ICD-10-CM | POA: Diagnosis present

## 2020-09-11 DIAGNOSIS — M25561 Pain in right knee: Secondary | ICD-10-CM | POA: Insufficient documentation

## 2020-09-11 DIAGNOSIS — M25661 Stiffness of right knee, not elsewhere classified: Secondary | ICD-10-CM | POA: Insufficient documentation

## 2020-09-11 DIAGNOSIS — G8929 Other chronic pain: Secondary | ICD-10-CM | POA: Diagnosis present

## 2020-09-11 NOTE — Therapy (Signed)
[facility] [address] [place], [place], [address] Phone: [phone]   Fax:  [phone]  Physical Therapy Treatment  Patient Details  Name: TAMAR MIANO MRN: [id number] Date of Birth: 1946-01-02 Referring Provider (PT): Alyssa Grove, [place]   Encounter Date: 09/11/2020   PT End of Session - 09/11/20 1120     Visit Number 8    Number of Visits 16    Date for PT Re-Evaluation 10/14/20    Authorization Type Traditional medicare A&B    Authorization Time Period 05/14/20-07/09/20    Authorization - Visit Number 8    Authorization - Number of Visits 10    PT Start Time 1114    PT Stop Time 1201    PT Time Calculation (min) 47 min    Equipment Utilized During Treatment Gait belt    Activity Tolerance Patient tolerated treatment well;Patient limited by fatigue    Behavior During Therapy [facility] for tasks assessed/performed;Anxious   Pt displays mod anxious during eval about the entire TKA process.            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HCC)    Arthritis    Branch retinal artery occlusion, left eye    Chronic kidney disease    moderate   Fatigue    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 11/25/2017   Hyperkalemia    Hyperlipidemia    Hypertension    Pneumonia    at least 10 years ago   Pre-diabetes     Past Surgical History:  Procedure Laterality Date   COLONOSCOPY WITH PROPOFOL N/A 08/09/2016   Procedure: COLONOSCOPY WITH PROPOFOL;  Surgeon: Scot Jun, MD;  Location: [facility] ENDOSCOPY;  Service: Endoscopy;  Laterality: N/A;   DILATATION & CURETTAGE/HYSTEROSCOPY WITH MYOSURE N/A 02/20/2018   Procedure: DILATATION & CURETTAGE/HYSTEROSCOPY WITH MYOSURE;  Surgeon: Schermerhorn, Ihor Austin, MD;  Location: [facility] ORS;  Service: Gynecology;  Laterality: N/A;   DILATION AND CURETTAGE OF UTERUS N/A 02/20/2018   Procedure: DILATATION AND CURETTAGE, fractional,  resection of endometrial mass, possible myosure, hysteroscopy;  Surgeon: Schermerhorn, Ihor Austin, MD;  Location: [facility] ORS;  Service: Gynecology;  Laterality: N/A;   FEMORAL HERNIA REPAIR Right 11/26/2017   Procedure: HERNIA REPAIR FEMORAL with insertion or mesh;  Surgeon: Ancil Linsey, MD;  Location: [facility] ORS;  Service: General;  Laterality: Right;   HYSTEROSCOPY WITH D & C N/A 02/20/2018   Procedure: DILATATION AND CURETTAGE /HYSTEROSCOPY,;  Surgeon: Schermerhorn, Ihor Austin, MD;  Location: [facility] ORS;  Service: Gynecology;  Laterality: N/A;   TOTAL KNEE ARTHROPLASTY Left 08/11/2020   Procedure: TOTAL KNEE ARTHROPLASTY;  Surgeon: Ollen Gross, MD;  Location: [facility] ORS;  Service: Orthopedics;  Laterality: Left;    There were no vitals filed for this visit.   Subjective Assessment - 09/11/20 1117     Subjective Pt presents with poor sleep the night before and mod emtional state 2/2 to death in the family. Pt states 0/10 pain at the start of tx without tramadol.    Pertinent History Pt is currently amb with FWW during home and community amb. Pt requires assist with transportation at this time. [place] and [place]. Pt has prolonged history of bilat knee pain 2/2 OA with accompanying heel pain.    Limitations Walking;Lifting;Standing;House hold activities;Other (comment)   Walking on the beach.   How long can you sit  comfortably? WNL    How long can you stand comfortably? 10 mins    How long can you walk comfortably? 120 ft with FWW    Diagnostic tests n/a    Patient Stated Goals Return to walking on the beach Enjoy life without having to need seated breaks.    Currently in Pain? No/denies    Pain Score 0-No pain    Pain Orientation Left              Therapeutic Exercise:   NuStep L2 with at seat position 6 to promote knee flex. Discussed sx response to last tx and adherence to gait with SPC.     Stair training x9 ascends/descends  with progression from Bilat rails and step to gait pattern with Unilat SPC and reciprocal gait patterns  //BARS:  Verbal cueing to only facilitate touch assist with // bars. 4# ankles weights bilat. 3 laps each exercise 1) march walking  2) hamstring curl walk      Manual Therapy:   IASTIM via thera-roller with intermittent knee ext with 5-10 sec of moderate OP. 9 minutes. Pt display great satisfaction in reduction of post knee and hamstring tightness and discomfort post manual intervention.   Prolonged distal hamstring stretching and knee flex with OP at end range. 8 mins.      PT Education - 09/11/20 1119     Education Details Pt was educated on proper stair sequencing with SPC.    Person(s) Educated Patient    Methods Explanation;Demonstration;Tactile cues;Verbal cues    Comprehension Verbalized understanding                 PT Long Term Goals - 08/26/20 1255       PT LONG TERM GOAL #1   Title Pt will increase FOTO score to 72 to promote pt report return to independence with ADLs    Baseline 67    Time 8    Period Weeks    Status New    Target Date 10/12/20      PT LONG TERM GOAL #2   Title Pt will increase LLE MMT equal to right to promote return to high level gait distances.    Baseline R/L  4/4 Hip flexion  5/5 Hip abduction seated  5/5 Hip adduction seated  5/3 Knee extension  5/3 Knee flexion 5/5 Ankle dorsiflexion  5/5 Ankle plantarflexion *no resistance applied    Time 8    Period Weeks    Status New    Target Date 10/12/20      PT LONG TERM GOAL #3   Title Pt will displayed reduced swelling in LLE so that circumference measures equal the RLE to promote increase ROM and normalized gait.    Baseline R/L 5cm above superior patellar pole 48/54, R/L mid patella 44/50, R/L tibial tuberosity: 39/42    Time 8    Period Weeks    Status New    Target Date 10/12/20      PT LONG TERM GOAL #4   Title PT will improve L knee ROM equal to R for decreased  crouched gait during standing and gait activities    Baseline Knee Flexion (0-135): R:105 L:90 (95 PROM)  Knee Extension (0): R: +15 L: +20    Time 8    Period Weeks    Status New    Target Date 10/12/20      PT LONG TERM GOAL #5   Title PT will be able to amb  1028ft  without AD to promote return amb on the beach during leisure visits    Baseline 300 ft with FWW SBA    Time 8    Period Weeks    Status New    Target Date 10/12/20      Additional Long Term Goals   Additional Long Term Goals Yes      PT LONG TERM GOAL #6   Title Pt will improve 5xSTS by at least 3 seconds to promote increased LE power during transfers and gait.    Baseline 8/16 19.97    Time 8    Period Weeks    Status New    Target Date 10/12/20                   Plan - 09/11/20 1121     Clinical Impression Statement Today's tx was focused on promoting confidence with stair climbing with the use of SPC and reciprocal stepping pattern. Pt displayed good understanding of stair climbing but difficulty with reciprocal stepping during descending motion. Pt continues to display mark LE endurance, strength, and ROM deficits resulting in decreased safe home/community mobiity, increased pain, and limited access to QOL. Pt. will continue to benefit from skilled physical therapy to progress POC to address remaining deficits to facilitate maximum functional capacity for optimal personal health and wellness for ADLs.    Personal Factors and Comorbidities Social Background;Transportation    Examination-Activity Limitations Bend;Stairs;Stand;Squat;Locomotion Level    Stability/Clinical Decision Making Evolving/Moderate complexity    Clinical Decision Making Moderate    Rehab Potential Good    PT Frequency 2x / week    PT Duration 8 weeks    PT Treatment/Interventions ADLs/Self Care Home Management;Aquatic Therapy;Cryotherapy;Moist Heat;Gait training;DME Instruction;Neuromuscular re-education;Balance training;Therapeutic  exercise;Therapeutic activities;Functional mobility training;Stair training;Patient/family education;Manual techniques;Passive range of motion;Biofeedback;Electrical Stimulation;Traction;Dry needling;Energy conservation;Taping;Joint Manipulations;Other (comment);Spinal Manipulations    PT Next Visit Plan Reassess L knee ROM    PT Home Exercise Plan Surgical office provided/ check incision healing.    Consulted and Agree with Plan of Care Patient             Patient will benefit from skilled therapeutic intervention in order to improve the following deficits and impairments:  Abnormal gait, Decreased range of motion, Difficulty walking, Impaired tone, Decreased endurance, Obesity, Pain, Decreased activity tolerance, Decreased balance, Hypomobility, Impaired flexibility, Improper body mechanics, Postural dysfunction, Decreased mobility, Decreased strength, Decreased skin integrity, Increased edema  Visit Diagnosis: Knee joint stiffness, bilateral  Chronic pain of both knees  Pain of left heel  Acute pain of left knee  Muscle weakness (generalized)  Gait difficulty     Problem List Patient Active Problem List   Diagnosis Date Noted   Osteoarthritis of knee 08/13/2020   OA (osteoarthritis) of knee 08/11/2020   Primary osteoarthritis of left knee 08/11/2020   SOB (shortness of breath) 10/10/2017   Coronary artery calcification 09/19/2016   Chest pain 02/03/2016   Elevated troponin 02/03/2016   Anxiety 02/03/2016   Essential hypertension 02/03/2016   Mixed hyperlipidemia 02/03/2016   Chest tightness 01/01/2016   Cammie Mcgee, PT, DPT # [id number] Lawernce Ion SPT 09/12/2020, 2:14 PM  [facility] [address] [place], [place], [address] Phone: [phone]   Fax:  [phone]  Name: NEVA RAMASWAMY MRN: [id number] Date of Birth: 28-Feb-1945

## 2020-09-16 ENCOUNTER — Encounter: Payer: Self-pay | Admitting: Physical Therapy

## 2020-09-16 ENCOUNTER — Ambulatory Visit: Payer: Medicare Other | Admitting: Physical Therapy

## 2020-09-16 ENCOUNTER — Other Ambulatory Visit: Payer: Self-pay

## 2020-09-16 DIAGNOSIS — M79672 Pain in left foot: Secondary | ICD-10-CM

## 2020-09-16 DIAGNOSIS — M25661 Stiffness of right knee, not elsewhere classified: Secondary | ICD-10-CM

## 2020-09-16 DIAGNOSIS — G8929 Other chronic pain: Secondary | ICD-10-CM

## 2020-09-16 DIAGNOSIS — R269 Unspecified abnormalities of gait and mobility: Secondary | ICD-10-CM

## 2020-09-16 DIAGNOSIS — M25562 Pain in left knee: Secondary | ICD-10-CM

## 2020-09-16 DIAGNOSIS — M6281 Muscle weakness (generalized): Secondary | ICD-10-CM

## 2020-09-16 NOTE — Therapy (Signed)
[facility] [address] [place], [place], [address] Phone: [phone]   Fax:  [phone]  Physical Therapy Treatment  Patient Details  Name: Joanne Lewis MRN: [id number] Date of Birth: [birth year] Referring Provider (PT): Alyssa Grove, [place]   Encounter Date: 09/16/2020   PT End of Session - 09/16/20 1121     Visit Number 9    Number of Visits 16    Date for PT Re-Evaluation 10/14/20    Authorization Type Traditional medicare A&B    Authorization Time Period 05/14/20-07/09/20    Authorization - Visit Number 9    Authorization - Number of Visits 10    PT Start Time 1111    PT Stop Time 1158    PT Time Calculation (min) 47 min    Equipment Utilized During Treatment Gait belt    Activity Tolerance Patient tolerated treatment well;Patient limited by fatigue    Behavior During Therapy [facility] for tasks assessed/performed;Anxious   Pt displays mod anxious during eval about the entire TKA process.            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HCC)    Arthritis    Branch retinal artery occlusion, left eye    Chronic kidney disease    moderate   Fatigue    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 11/25/2017   Hyperkalemia    Hyperlipidemia    Hypertension    Pneumonia    at least 10 years ago   Pre-diabetes     Past Surgical History:  Procedure Laterality Date   COLONOSCOPY WITH PROPOFOL N/A 08/09/2016   Procedure: COLONOSCOPY WITH PROPOFOL;  Surgeon: Scot Jun, MD;  Location: [facility] ENDOSCOPY;  Service: Endoscopy;  Laterality: N/A;   DILATATION & CURETTAGE/HYSTEROSCOPY WITH MYOSURE N/A 02/20/2018   Procedure: DILATATION & CURETTAGE/HYSTEROSCOPY WITH MYOSURE;  Surgeon: Schermerhorn, Ihor Austin, MD;  Location: [facility] ORS;  Service: Gynecology;  Laterality: N/A;   DILATION AND CURETTAGE OF UTERUS N/A 02/20/2018   Procedure: DILATATION AND CURETTAGE, fractional,  resection of endometrial mass, possible myosure, hysteroscopy;  Surgeon: Schermerhorn, Ihor Austin, MD;  Location: [facility] ORS;  Service: Gynecology;  Laterality: N/A;   FEMORAL HERNIA REPAIR Right 11/26/2017   Procedure: HERNIA REPAIR FEMORAL with insertion or mesh;  Surgeon: Ancil Linsey, MD;  Location: [facility] ORS;  Service: General;  Laterality: Right;   HYSTEROSCOPY WITH D & C N/A 02/20/2018   Procedure: DILATATION AND CURETTAGE /HYSTEROSCOPY,;  Surgeon: Schermerhorn, Ihor Austin, MD;  Location: [facility] ORS;  Service: Gynecology;  Laterality: N/A;   TOTAL KNEE ARTHROPLASTY Left 08/11/2020   Procedure: TOTAL KNEE ARTHROPLASTY;  Surgeon: Ollen Gross, MD;  Location: [facility] ORS;  Service: Orthopedics;  Laterality: Left;    There were no vitals filed for this visit.   Subjective Assessment - 09/16/20 1116     Subjective Pt present to tx without complaints of pain. Pt states an active weekend with completing laundry that involved walking up and down 13 stairs with basket in hand. Pt returns surgical office today.    Pertinent History Pt is currently amb with FWW during home and community amb. Pt requires assist with transportation at this time. [place] and [place]. Pt has prolonged history of bilat knee pain 2/2 OA with accompanying heel pain.    Limitations Walking;Lifting;Standing;House hold activities;Other (comment)   Walking on the beach.   How  long can you sit comfortably? WNL    How long can you stand comfortably? 10 mins    How long can you walk comfortably? 120 ft with FWW    Diagnostic tests n/a    Patient Stated Goals Return to walking on the beach Enjoy life without having to need seated breaks.    Currently in Pain? No/denies    Pain Score 0-No pain              Therapeutic Exercise:   NuStep L4 with at seat position 9 to promote knee flex. Discussed sx response to last tx and adherence to gait with SPC.     Amb 526ft SBA  without AD. Minor verbal cueing to facilitate increase step width.    12'' lunges to promote knee flexion x30 each leg, then 2x30 sec each side.   12'' toe tapping x 30 each leg. Verbal cueing for proper lumbar and hip ext.    Standing at plinth with touch assist for balance  4# ankles weights bilat. X30 each leg.  1) march   2) hamstring curl    ROM assessment:  Knee Flexion (0-135): R:105 L:112 (116 PROM)  Knee Extension (0): R: +15 L: +14 (8 prom)    Manual Therapy:   Prolonged distal hamstring stretching and knee flex with OP at end range. 11 mins.          PT Education - 09/16/20 1120     Education Details Pt was educated on proper gait sequencing without AD.    Person(s) Educated Patient    Methods Explanation;Demonstration;Tactile cues;Verbal cues    Comprehension Verbalized understanding;Need further instruction                 PT Long Term Goals - 09/16/20 1215       PT LONG TERM GOAL #1   Title Pt will increase FOTO score to 72 to promote pt report return to independence with ADLs    Baseline 67    Time 8    Period Weeks    Status New    Target Date 10/12/20      PT LONG TERM GOAL #2   Title Pt will increase LLE MMT equal to right to promote return to high level gait distances.    Baseline R/L  4/4 Hip flexion  5/5 Hip abduction seated  5/5 Hip adduction seated  5/3 Knee extension  5/3 Knee flexion 5/5 Ankle dorsiflexion  5/5 Ankle plantarflexion *no resistance applied    Time 8    Period Weeks    Status New    Target Date 10/12/20      PT LONG TERM GOAL #3   Title Pt will displayed reduced swelling in LLE so that circumference measures equal the RLE to promote increase ROM and normalized gait.    Baseline R/L 5cm above superior patellar pole 48/54, R/L mid patella 44/50, R/L tibial tuberosity: 39/42    Time 8    Period Weeks    Status New    Target Date 10/12/20      PT LONG TERM GOAL #4   Title PT will improve L knee ROM equal to R for  decreased crouched gait during standing and gait activities    Baseline Knee Flexion (0-135): R:105 L:90 (95 PROM)  Knee Extension (0): R: +15 L: +20. 9/6: Knee Flexion (0-135): R:105 L:112 (116 PROM)  Knee Extension (0): R: +15 L: +14 (8 prom)    Time 8  Period Weeks    Status New      PT LONG TERM GOAL #5   Title PT will be able to amb 1041ft  without AD to promote return amb on the beach during leisure visits    Baseline 300 ft with FWW SBA    Time 8    Period Weeks    Status New      PT LONG TERM GOAL #6   Title Pt will improve 5xSTS by at least 3 seconds to promote increased LE power during transfers and gait.    Baseline 8/16 19.97    Time 8    Period Weeks    Status New                   Plan - 09/16/20 1122     Clinical Impression Statement Pt. tolerated tx very well today with amb of 500 ft without AD. Only minor verbal cueing was needed to facilitate wider step width. Pt ROM was assessed on this date:  Knee Flexion (: R:105 L:112 (116 PROM)  Knee Extension: R: +15 L: +14 (8 PROM). Pt continues to display mark LE endurance, strength, and ROM deficits resulting in decreased safe home/community mobiity, increased pain, and limited access to QOL. Pt. will continue to benefit from skilled physical therapy to progress POC to address remaining deficits to facilitate maximum functional capacity for optimal personal health and wellness for ADLs.    Personal Factors and Comorbidities Social Background;Transportation    Examination-Activity Limitations Bend;Stairs;Stand;Squat;Locomotion Level    Stability/Clinical Decision Making Evolving/Moderate complexity    Clinical Decision Making Moderate    Rehab Potential Good    PT Frequency 2x / week    PT Duration 8 weeks    PT Treatment/Interventions ADLs/Self Care Home Management;Aquatic Therapy;Cryotherapy;Moist Heat;Gait training;DME Instruction;Neuromuscular re-education;Balance training;Therapeutic exercise;Therapeutic  activities;Functional mobility training;Stair training;Patient/family education;Manual techniques;Passive range of motion;Biofeedback;Electrical Stimulation;Traction;Dry needling;Energy conservation;Taping;Joint Manipulations;Other (comment);Spinal Manipulations    PT Next Visit Plan Discuss MD f/u.  Complete 10th visit progress note.    PT Home Exercise Plan Surgical office provided/ check incision healing.    Consulted and Agree with Plan of Care Patient             Patient will benefit from skilled therapeutic intervention in order to improve the following deficits and impairments:  Abnormal gait, Decreased range of motion, Difficulty walking, Impaired tone, Decreased endurance, Obesity, Pain, Decreased activity tolerance, Decreased balance, Hypomobility, Impaired flexibility, Improper body mechanics, Postural dysfunction, Decreased mobility, Decreased strength, Decreased skin integrity, Increased edema  Visit Diagnosis: Knee joint stiffness, bilateral  Chronic pain of both knees  Pain of left heel  Acute pain of left knee  Gait difficulty  Muscle weakness (generalized)     Problem List Patient Active Problem List   Diagnosis Date Noted   Osteoarthritis of knee 08/13/2020   OA (osteoarthritis) of knee 08/11/2020   Primary osteoarthritis of left knee 08/11/2020   SOB (shortness of breath) 10/10/2017   Coronary artery calcification 09/19/2016   Chest pain 02/03/2016   Elevated troponin 02/03/2016   Anxiety 02/03/2016   Essential hypertension 02/03/2016   Mixed hyperlipidemia 02/03/2016   Chest tightness 01/01/2016   Cammie Mcgee, PT, DPT # [id number] Lawernce Ion SPT 09/16/2020, 12:30 PM  [facility] [address] [place], [place], [address] Phone: [phone]   Fax:  [phone]  Name: Joanne Lewis MRN: [id number] Date of Birth: 1945/03/24

## 2020-09-18 ENCOUNTER — Other Ambulatory Visit: Payer: Self-pay

## 2020-09-18 ENCOUNTER — Encounter: Payer: Self-pay | Admitting: Physical Therapy

## 2020-09-18 ENCOUNTER — Ambulatory Visit: Payer: Medicare Other | Admitting: Physical Therapy

## 2020-09-18 DIAGNOSIS — M25562 Pain in left knee: Secondary | ICD-10-CM

## 2020-09-18 DIAGNOSIS — M25661 Stiffness of right knee, not elsewhere classified: Secondary | ICD-10-CM | POA: Diagnosis not present

## 2020-09-18 DIAGNOSIS — M6281 Muscle weakness (generalized): Secondary | ICD-10-CM

## 2020-09-18 DIAGNOSIS — G8929 Other chronic pain: Secondary | ICD-10-CM

## 2020-09-18 DIAGNOSIS — M79672 Pain in left foot: Secondary | ICD-10-CM

## 2020-09-18 DIAGNOSIS — R269 Unspecified abnormalities of gait and mobility: Secondary | ICD-10-CM

## 2020-09-18 NOTE — Therapy (Signed)
[facility] [address] [place], [place], [address] Phone: [phone]   Fax:  [phone]  Physical Therapy Treatment and Progress Note 08/19/20-09/18/20  Patient Details  Name: KEIANA TAVELLA MRN: [id number] Date of Birth: Nov 30, 1945 Referring Provider (PT): Jonnie Kind, [place]   Encounter Date: 09/18/2020   PT End of Session - 09/18/20 1116     Visit Number 10    Number of Visits 16    Date for PT Re-Evaluation 10/14/20    Authorization Type Traditional medicare A&B    Authorization Time Period 05/14/20-07/09/20    Authorization - Visit Number 10    Authorization - Number of Visits 10    PT Start Time 5852    PT Stop Time 1203    PT Time Calculation (min) 49 min    Equipment Utilized During Treatment Gait belt    Activity Tolerance Patient tolerated treatment well;Patient limited by fatigue    Behavior During Therapy [facility] for tasks assessed/performed;Anxious   Pt displays mod anxious during eval about the entire TKA process.            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[place])    Arthritis    Branch retinal artery occlusion, left eye    Chronic kidney disease    moderate   Fatigue    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 11/25/2017   Hyperkalemia    Hyperlipidemia    Hypertension    Pneumonia    at least 10 years ago   Pre-diabetes     Past Surgical History:  Procedure Laterality Date   COLONOSCOPY WITH PROPOFOL N/A 08/09/2016   Procedure: COLONOSCOPY WITH PROPOFOL;  Surgeon: Manya Silvas, MD;  Location: [facility] ENDOSCOPY;  Service: Endoscopy;  Laterality: N/A;   DILATATION & CURETTAGE/HYSTEROSCOPY WITH MYOSURE N/A 02/20/2018   Procedure: DILATATION & CURETTAGE/HYSTEROSCOPY WITH MYOSURE;  Surgeon: Schermerhorn, Gwen Her, MD;  Location: [facility] ORS;  Service: Gynecology;  Laterality: N/A;   DILATION AND CURETTAGE OF UTERUS N/A 02/20/2018   Procedure:  DILATATION AND CURETTAGE, fractional, resection of endometrial mass, possible myosure, hysteroscopy;  Surgeon: Schermerhorn, Gwen Her, MD;  Location: [facility] ORS;  Service: Gynecology;  Laterality: N/A;   FEMORAL HERNIA REPAIR Right 11/26/2017   Procedure: HERNIA REPAIR FEMORAL with insertion or mesh;  Surgeon: Vickie Epley, MD;  Location: [facility] ORS;  Service: General;  Laterality: Right;   HYSTEROSCOPY WITH D & C N/A 02/20/2018   Procedure: DILATATION AND CURETTAGE /HYSTEROSCOPY,;  Surgeon: Schermerhorn, Gwen Her, MD;  Location: [facility] ORS;  Service: Gynecology;  Laterality: N/A;   TOTAL KNEE ARTHROPLASTY Left 08/11/2020   Procedure: TOTAL KNEE ARTHROPLASTY;  Surgeon: Gaynelle Arabian, MD;  Location: [facility]S;  Service: Orthopedics;  Laterality: Left;    There were no vitals filed for this visit.   Subjective Assessment - 09/18/20 1114     Subjective Pt present to tx without pain and mild stiffness. Pt has no new complaints at this time. Pt stated that her surgical office was pleased with progress.    Pertinent History Pt is currently amb with FWW during home and community amb. Pt requires assist with transportation at this time. [place] and leisure. Pt has prolonged history of bilat knee pain 2/2 OA with accompanying heel pain.    Limitations Walking;Lifting;Standing;House hold activities;Other (comment)   Walking on the beach.   How long can  you sit comfortably? WNL    How long can you stand comfortably? 10 mins    How long can you walk comfortably? 120 ft with FWW    Diagnostic tests n/a    Patient Stated Goals Return to walking on the beach Enjoy life without having to need seated breaks.    Currently in Pain? No/denies    Pain Score 0-No pain    Pain Orientation Left    Pain Descriptors / Indicators Aching;Sore    Pain Type Surgical pain    Pain Frequency Intermittent            Reassessment:  FOTO: 63    R/L 4/4+ hip flexion, 5/4+  Knee extension  5/+ Knee flexion  Edema: R/L 5cm above superior patellar pole 48/54, R/L mid patella 44/47, R/L tibial tuberosity: 39/40  ROM: 9/6: Knee Flexion (0-135): R:105 L:112 (116 PROM)  Knee Extension (0): R: +15 L: +14 (8 prom)  Gait: 500 ft SBA without AD  5xSTS: 10.38 secs     Treatment:   Therapeutic Exercise:   NuStep L5 with 68mns at seat position 9 to promote knee flex. Discussed sx response to last tx and adherence to gait with SPC.     Access Code: [email or web address]: [email or web address] Date: 09/18/2020 Prepared by: MDorcas Carrow Exercises  Standing Hip Abduction - 1 x daily - 5 x weekly - 30 reps Standing Hip Extension with Counter Support - 1 x daily - 5 x weekly - 30 reps Standing Hip Flexion with Counter Support - 1 x daily - 5 x weekly - 30 reps Standing Knee Flexion - 1 x daily - 5 x weekly - 30 reps Standing Heel Raises - 1 x daily - 5 x weekly - 30 reps Sit to Stand with Arms Crossed - 1 x daily - 5 x weekly - 30 reps Seated Hamstring Stretch - 1 x daily - 5 x weekly - 3 sets - 30 hold Supine Heel Slide with Strap - 1 x daily - 5 x weekly - 3 sets - 30 hold       PT Long Term Goals - 09/18/20 1205       PT LONG TERM GOAL #1   Title Pt will increase FOTO score to 72 to promote pt report return to independence with ADLs    Baseline 67: 6/8: 63    Time 8    Period Weeks    Status Deferred    Target Date 10/12/20      PT LONG TERM GOAL #2   Title Pt will increase LLE MMT equal to right to promote return to high level gait distances.    Baseline R/L  4/4 Hip flexion  5/5 Hip abduction seated  5/5 Hip adduction seated  5/3 Knee extension  5/3 Knee flexion 5/5 Ankle dorsiflexion  5/5 Ankle plantarflexion *no resistance applied 9/8: R/L 4/4+ hip flexion, 5/4+ Knee extension  5/+ Knee flexion    Time 8    Period Weeks    Status Partially Met    Target Date 10/12/20      PT LONG TERM GOAL #3   Title Pt will displayed reduced  swelling in LLE so that circumference measures equal the RLE to promote increase ROM and normalized gait.    Baseline R/L 5cm above superior patellar pole 48/54, R/L mid patella 44/50, R/L tibial tuberosity: 39/42     9/8: R/L 5cm above superior patellar pole 48/54, R/L mid patella 44/47, R/L tibial tuberosity: 39/40  Time 8    Period Weeks    Status Partially Met    Target Date 10/12/20      PT LONG TERM GOAL #4   Title PT will improve L knee ROM equal to R for decreased crouched gait during standing and gait activities    Baseline Knee Flexion (0-135): R:105 L:90 (95 PROM)  Knee Extension (0): R: +15 L: +20. 9/6: Knee Flexion (0-135): R:105 L:112 (116 PROM)  Knee Extension (0): R: +15 L: +14 (8 prom)    Time 8    Period Weeks    Status New    Target Date 10/12/20      PT LONG TERM GOAL #5   Title PT will be able to amb 1049f  without AD to promote return amb on the beach during leisure visits    Baseline 300 ft with FWW SBA 9/8: 500 ft SBA without AD    Time 8    Period Weeks    Status Partially Met    Target Date 10/12/20      PT LONG TERM GOAL #6   Title Pt will improve 5xSTS by at least 3 seconds to promote increased LE power during transfers and gait.    Baseline 8/16 19.97 6/8: 10.38    Time 8    Period Weeks    Status New    Target Date 10/12/20                   Plan - 09/18/20 1116     Clinical Impression Statement Pt was reassessed on this date 09/18/20. Pt reassessment displayed the following results: FOTO: 63.  R/L 4/4+ hip flexion, 5/4+ Knee extension  5/+ Knee flexion. Edema: R/L 5cm above superior patellar pole 48/54, R/L mid patella 44/47, R/L tibial tuberosity: 39/40. ROM: 9/6: Knee Flexion (0-135): R:105 L:112 (116 PROM)  Knee Extension (0): R: +15 L: +14 (8 prom). Gait: 500 ft SBA without AD. 5xSTS: 10.38 secs. Pt has displayed excellent progression with all goals besides FOTO score which was lower from initial eval. Pt displayed good understanding of  HEP with good mechanics during tx. Pt continues to display mark LE endurance, strength, and ROM deficits resulting in decreased safe home/community mobiity, increased pain, and limited access to QOL. Pt. will continue to benefit from skilled physical therapy to progress POC to address remaining deficits to facilitate maximum functional capacity for optimal personal health and wellness for ADLs.    Personal Factors and Comorbidities Social Background;Transportation    Examination-Activity Limitations Bend;Stairs;Stand;Squat;Locomotion Level    Stability/Clinical Decision Making Evolving/Moderate complexity    Clinical Decision Making Moderate    Rehab Potential Good    PT Frequency 2x / week    PT Duration 8 weeks    PT Treatment/Interventions ADLs/Self Care Home Management;Aquatic Therapy;Cryotherapy;Moist Heat;Gait training;DME Instruction;Neuromuscular re-education;Balance training;Therapeutic exercise;Therapeutic activities;Functional mobility training;Stair training;Patient/family education;Manual techniques;Passive range of motion;Biofeedback;Electrical Stimulation;Traction;Dry needling;Energy conservation;Taping;Joint Manipulations;Other (comment);Spinal Manipulations    PT Next Visit Plan Reassess new HEP adherence.    PT Home Exercise Plan [email or web address]    Consulted and Agree with Plan of Care Patient             Patient will benefit from skilled therapeutic intervention in order to improve the following deficits and impairments:  Abnormal gait, Decreased range of motion, Difficulty walking, Impaired tone, Decreased endurance, Obesity, Pain, Decreased activity tolerance, Decreased balance, Hypomobility, Impaired flexibility, Improper body mechanics, Postural dysfunction, Decreased mobility, Decreased strength, Decreased skin integrity, Increased edema  Visit  Diagnosis: Knee joint stiffness, bilateral  Chronic pain of both knees  Pain of left heel  Acute pain of left knee  Muscle  weakness (generalized)  Gait difficulty     Problem List Patient Active Problem List   Diagnosis Date Noted   Osteoarthritis of knee 08/13/2020   OA (osteoarthritis) of knee 08/11/2020   Primary osteoarthritis of left knee 08/11/2020   SOB (shortness of breath) 10/10/2017   Coronary artery calcification 09/19/2016   Chest pain 02/03/2016   Elevated troponin 02/03/2016   Anxiety 02/03/2016   Essential hypertension 02/03/2016   Mixed hyperlipidemia 02/03/2016   Chest tightness 01/01/2016   Pura Spice, PT, DPT # [id number] Fara Olden, SPT 09/18/2020, 12:36 PM  [place] [facility] [address] [place], [place], [address] Phone: [phone]   Fax:  [phone]  Name: JAEDEN WESTBAY MRN: [id number] Date of Birth: 1945/07/28

## 2020-09-18 NOTE — Patient Instructions (Signed)
Access Code: [email or web address] URL: [email or web address] Date: 09/18/2020 Prepared by: Dorene Grebe  Exercises  Standing Hip Abduction - 1 x daily - 5 x weekly - 30 reps Standing Hip Extension with Counter Support - 1 x daily - 5 x weekly - 30 reps Standing Hip Flexion with Counter Support - 1 x daily - 5 x weekly - 30 reps Standing Knee Flexion - 1 x daily - 5 x weekly - 30 reps Standing Heel Raises - 1 x daily - 5 x weekly - 30 reps Sit to Stand with Arms Crossed - 1 x daily - 5 x weekly - 30 reps Seated Hamstring Stretch - 1 x daily - 5 x weekly - 3 sets - 30 hold Supine Heel Slide with Strap - 1 x daily - 5 x weekly - 3 sets - 30 hold

## 2020-09-23 ENCOUNTER — Other Ambulatory Visit: Payer: Self-pay

## 2020-09-23 ENCOUNTER — Ambulatory Visit: Payer: Medicare Other | Admitting: Physical Therapy

## 2020-09-23 ENCOUNTER — Encounter: Payer: Self-pay | Admitting: Physical Therapy

## 2020-09-23 DIAGNOSIS — M6281 Muscle weakness (generalized): Secondary | ICD-10-CM

## 2020-09-23 DIAGNOSIS — R269 Unspecified abnormalities of gait and mobility: Secondary | ICD-10-CM

## 2020-09-23 DIAGNOSIS — M25562 Pain in left knee: Secondary | ICD-10-CM

## 2020-09-23 DIAGNOSIS — M25661 Stiffness of right knee, not elsewhere classified: Secondary | ICD-10-CM

## 2020-09-23 DIAGNOSIS — M79672 Pain in left foot: Secondary | ICD-10-CM

## 2020-09-23 NOTE — Therapy (Signed)
[place] [facility] [address]. [place], [place], [address] Phone: [phone]   Fax:  [phone]  Physical Therapy Treatment  Patient Details  Name: Joanne Lewis MRN: [id number] Date of Birth: 1945/05/12 Referring Provider (PT): Jonnie Kind, PA-C   Encounter Date: 09/23/2020   PT End of Session - 09/23/20 1130     Visit Number 11    Number of Visits 16    Date for PT Re-Evaluation 10/14/20    Authorization Type Traditional medicare A&B    Authorization Time Period --    Authorization - Visit Number 1    Authorization - Number of Visits 10    PT Start Time 1120    PT Stop Time 1740    PT Time Calculation (min) 44 min    Equipment Utilized During Treatment Gait belt    Activity Tolerance Patient tolerated treatment well;Patient limited by fatigue    Behavior During Therapy [facility] for tasks assessed/performed;Anxious   Pt displays mod anxious during eval about the entire TKA process.            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[place])    Arthritis    Branch retinal artery occlusion, left eye    Chronic kidney disease    moderate   Fatigue    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 11/25/2017   Hyperkalemia    Hyperlipidemia    Hypertension    Pneumonia    at least 10 years ago   Pre-diabetes     Past Surgical History:  Procedure Laterality Date   COLONOSCOPY WITH PROPOFOL N/A 08/09/2016   Procedure: COLONOSCOPY WITH PROPOFOL;  Surgeon: Manya Silvas, MD;  Location: [facility] ENDOSCOPY;  Service: Endoscopy;  Laterality: N/A;   DILATATION & CURETTAGE/HYSTEROSCOPY WITH MYOSURE N/A 02/20/2018   Procedure: DILATATION & CURETTAGE/HYSTEROSCOPY WITH MYOSURE;  Surgeon: Schermerhorn, Gwen Her, MD;  Location: [facility] ORS;  Service: Gynecology;  Laterality: N/A;   DILATION AND CURETTAGE OF UTERUS N/A 02/20/2018   Procedure: DILATATION AND CURETTAGE, fractional, resection of  endometrial mass, possible myosure, hysteroscopy;  Surgeon: Schermerhorn, Gwen Her, MD;  Location: [facility] ORS;  Service: Gynecology;  Laterality: N/A;   FEMORAL HERNIA REPAIR Right 11/26/2017   Procedure: HERNIA REPAIR FEMORAL with insertion or mesh;  Surgeon: Vickie Epley, MD;  Location: [facility] ORS;  Service: General;  Laterality: Right;   HYSTEROSCOPY WITH D & C N/A 02/20/2018   Procedure: DILATATION AND CURETTAGE /HYSTEROSCOPY,;  Surgeon: Schermerhorn, Gwen Her, MD;  Location: [facility] ORS;  Service: Gynecology;  Laterality: N/A;   TOTAL KNEE ARTHROPLASTY Left 08/11/2020   Procedure: TOTAL KNEE ARTHROPLASTY;  Surgeon: Gaynelle Arabian, MD;  Location: [facility] ORS;  Service: Orthopedics;  Laterality: Left;    There were no vitals filed for this visit.   Subjective Assessment - 09/23/20 1127     Subjective Pt presents to tx with no pain and mild tightness in the knee. Pt states that she as noticed a few small "hard bumps" under her scar during scar message. Pt states that she was able to go out and see her friends for her birthday over the weekend without functional impairments regarding the knee.    Pertinent History Pt is currently amb with FWW during home and community amb. Pt requires assist with transportation at this time. [place] and [place]. Pt has prolonged history of bilat knee pain 2/2  OA with accompanying heel pain.    Limitations Walking;Lifting;Standing;House hold activities;Other (comment)   Walking on the beach.   How long can you sit comfortably? WNL    How long can you stand comfortably? 10 mins    How long can you walk comfortably? 120 ft with FWW    Diagnostic tests n/a    Patient Stated Goals Return to walking on the beach Enjoy life without having to need seated breaks.    Currently in Pain? No/denies    Pain Score 0-No pain              Therapeutic Exercise:   NuStep L4 with 63mns at seat position 7 to promote knee flex.  Discussed sx response to last tx and adherence to gait with SPC.     Standing at plinth with touch assist for balance  4# ankles weights bilat. X30 each leg.  1) march   2) hamstring curl 3) lateral lunge   TG squats 3x10 with SBA via handle and CGA during getting on/off. Verbal cueing to ensure optimal LE alignment for safe movement and optimal tissue loading.      Manual Therapy:   Sustained L flexion with mod L knee OP with intermittent superior grade 2 patellar mobs. Pt stated reduction in pain at end range knee flexion post patellar mobs      PT Long Term Goals - 09/18/20 1205       PT LONG TERM GOAL #1   Title Pt will increase FOTO score to 72 to promote pt report return to independence with ADLs    Baseline 67: 6/8: 63    Time 8    Period Weeks    Status Deferred    Target Date 10/12/20      PT LONG TERM GOAL #2   Title Pt will increase LLE MMT equal to right to promote return to high level gait distances.    Baseline R/L  4/4 Hip flexion  5/5 Hip abduction seated  5/5 Hip adduction seated  5/3 Knee extension  5/3 Knee flexion 5/5 Ankle dorsiflexion  5/5 Ankle plantarflexion *no resistance applied 9/8: R/L 4/4+ hip flexion, 5/4+ Knee extension  5/+ Knee flexion    Time 8    Period Weeks    Status Partially Met    Target Date 10/12/20      PT LONG TERM GOAL #3   Title Pt will displayed reduced swelling in LLE so that circumference measures equal the RLE to promote increase ROM and normalized gait.    Baseline R/L 5cm above superior patellar pole 48/54, R/L mid patella 44/50, R/L tibial tuberosity: 39/42     9/8: R/L 5cm above superior patellar pole 48/54, R/L mid patella 44/47, R/L tibial tuberosity: 39/40    Time 8    Period Weeks    Status Partially Met    Target Date 10/12/20      PT LONG TERM GOAL #4   Title PT will improve L knee ROM equal to R for decreased crouched gait during standing and gait activities    Baseline Knee Flexion (0-135): R:105 L:90 (95  PROM)  Knee Extension (0): R: +15 L: +20. 9/6: Knee Flexion (0-135): R:105 L:112 (116 PROM)  Knee Extension (0): R: +15 L: +14 (8 prom)    Time 8    Period Weeks    Status New    Target Date 10/12/20      PT LONG TERM GOAL #5   Title PT will  be able to amb 1072f  without AD to promote return amb on the beach during leisure visits    Baseline 300 ft with FWW SBA 9/8: 500 ft SBA without AD    Time 8    Period Weeks    Status Partially Met    Target Date 10/12/20      PT LONG TERM GOAL #6   Title Pt will improve 5xSTS by at least 3 seconds to promote increased LE power during transfers and gait.    Baseline 8/16 19.97 6/8: 10.38    Time 8    Period Weeks    Status New    Target Date 10/12/20                   Plan - 09/23/20 1131     Clinical Impression Statement Today's tx was focused on promotion of knee flexion in partial WB with TG. Pt stated no increase in pain post tx with progressive intervention. Pt was able to tolerate end range knee flexion with OP with intermittent superior patellar mobs. Pt continues to display mark LE endurance, strength, and ROM deficits resulting in decreased safe home/community mobiity, increased pain, and limited access to QOL. Pt. will continue to benefit from skilled physical therapy to progress POC to address remaining deficits to facilitate maximum functional capacity for optimal personal health and wellness for ADLs.    Personal Factors and Comorbidities Social Background;Transportation    Examination-Activity Limitations Bend;Stairs;Stand;Squat;Locomotion Level    Stability/Clinical Decision Making Evolving/Moderate complexity    Clinical Decision Making Moderate    Rehab Potential Good    PT Frequency 2x / week    PT Duration 8 weeks    PT Treatment/Interventions ADLs/Self Care Home Management;Aquatic Therapy;Cryotherapy;Moist Heat;Gait training;DME Instruction;Neuromuscular re-education;Balance training;Therapeutic  exercise;Therapeutic activities;Functional mobility training;Stair training;Patient/family education;Manual techniques;Passive range of motion;Biofeedback;Electrical Stimulation;Traction;Dry needling;Energy conservation;Taping;Joint Manipulations;Other (comment);Spinal Manipulations    PT Next Visit Plan Progression LE therex.    PT Home Exercise Plan [id number]    Consulted and Agree with Plan of Care Patient             Patient will benefit from skilled therapeutic intervention in order to improve the following deficits and impairments:  Abnormal gait, Decreased range of motion, Difficulty walking, Impaired tone, Decreased endurance, Obesity, Pain, Decreased activity tolerance, Decreased balance, Hypomobility, Impaired flexibility, Improper body mechanics, Postural dysfunction, Decreased mobility, Decreased strength, Decreased skin integrity, Increased edema  Visit Diagnosis: Knee joint stiffness, bilateral  Gait difficulty  Muscle weakness (generalized)  Pain of left heel  Acute pain of left knee     Problem List Patient Active Problem List   Diagnosis Date Noted   Osteoarthritis of knee 08/13/2020   OA (osteoarthritis) of knee 08/11/2020   Primary osteoarthritis of left knee 08/11/2020   SOB (shortness of breath) 10/10/2017   Coronary artery calcification 09/19/2016   Chest pain 02/03/2016   Elevated troponin 02/03/2016   Anxiety 02/03/2016   Essential hypertension 02/03/2016   Mixed hyperlipidemia 02/03/2016   Chest tightness 01/01/2016   MPura Spice PT, DPT # [id number] Olden SPT 09/23/2020, 3:39 PM  [place] [facility] [place] [address]Phone: [phone]  Fax:  [phone] Name: Joanne ESSEXMRN: [id number] of Birth: [birth year]

## 2020-09-25 ENCOUNTER — Other Ambulatory Visit: Payer: Self-pay

## 2020-09-25 ENCOUNTER — Encounter: Payer: Self-pay | Admitting: Physical Therapy

## 2020-09-25 ENCOUNTER — Ambulatory Visit: Payer: Medicare Other | Admitting: Physical Therapy

## 2020-09-25 DIAGNOSIS — G8929 Other chronic pain: Secondary | ICD-10-CM

## 2020-09-25 DIAGNOSIS — M79672 Pain in left foot: Secondary | ICD-10-CM

## 2020-09-25 DIAGNOSIS — M25661 Stiffness of right knee, not elsewhere classified: Secondary | ICD-10-CM

## 2020-09-25 DIAGNOSIS — R269 Unspecified abnormalities of gait and mobility: Secondary | ICD-10-CM

## 2020-09-25 DIAGNOSIS — M6281 Muscle weakness (generalized): Secondary | ICD-10-CM

## 2020-09-25 DIAGNOSIS — M25662 Stiffness of left knee, not elsewhere classified: Secondary | ICD-10-CM

## 2020-09-25 DIAGNOSIS — M25562 Pain in left knee: Secondary | ICD-10-CM

## 2020-09-25 NOTE — Therapy (Signed)
[place] [facility] [address]. [place], [place], [address] Phone: [phone]   Fax:  [phone]  Physical Therapy Treatment  Patient Details  Name: Joanne Lewis MRN: [id number] Date of Birth: 07/18/1945 Referring Provider (PT): Jonnie Kind, [place]   Encounter Date: 09/25/2020   PT End of Session - 09/25/20 1626     Visit Number 12    Number of Visits 16    Date for PT Re-Evaluation 10/14/20    Authorization Type Traditional medicare A&B    Authorization - Visit Number 2    Authorization - Number of Visits 10    PT Start Time 0623    PT Stop Time 1203    PT Time Calculation (min) 47 min    Equipment Utilized During Treatment Gait belt    Activity Tolerance Patient tolerated treatment well;Patient limited by fatigue    Behavior During Therapy [facility] for tasks assessed/performed;Anxious   Pt displays mod anxious during eval about the entire TKA process.            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[place])    Arthritis    Branch retinal artery occlusion, left eye    Chronic kidney disease    moderate   Fatigue    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 11/25/2017   Hyperkalemia    Hyperlipidemia    Hypertension    Pneumonia    at least 10 years ago   Pre-diabetes     Past Surgical History:  Procedure Laterality Date   COLONOSCOPY WITH PROPOFOL N/A 08/09/2016   Procedure: COLONOSCOPY WITH PROPOFOL;  Surgeon: Manya Silvas, MD;  Location: [facility] ENDOSCOPY;  Service: Endoscopy;  Laterality: N/A;   DILATATION & CURETTAGE/HYSTEROSCOPY WITH MYOSURE N/A 02/20/2018   Procedure: DILATATION & CURETTAGE/HYSTEROSCOPY WITH MYOSURE;  Surgeon: Schermerhorn, Gwen Her, MD;  Location: [facility] ORS;  Service: Gynecology;  Laterality: N/A;   DILATION AND CURETTAGE OF UTERUS N/A 02/20/2018   Procedure: DILATATION AND CURETTAGE, fractional, resection of endometrial mass, possible  myosure, hysteroscopy;  Surgeon: Schermerhorn, Gwen Her, MD;  Location: [facility] ORS;  Service: Gynecology;  Laterality: N/A;   FEMORAL HERNIA REPAIR Right 11/26/2017   Procedure: HERNIA REPAIR FEMORAL with insertion or mesh;  Surgeon: Vickie Epley, MD;  Location: [facility] ORS;  Service: General;  Laterality: Right;   HYSTEROSCOPY WITH D & C N/A 02/20/2018   Procedure: DILATATION AND CURETTAGE /HYSTEROSCOPY,;  Surgeon: Schermerhorn, Gwen Her, MD;  Location: [facility] ORS;  Service: Gynecology;  Laterality: N/A;   TOTAL KNEE ARTHROPLASTY Left 08/11/2020   Procedure: TOTAL KNEE ARTHROPLASTY;  Surgeon: Gaynelle Arabian, MD;  Location: [facility] ORS;  Service: Orthopedics;  Laterality: Left;    There were no vitals filed for this visit.   Subjective Assessment - 09/25/20 1624     Subjective Pt present to tx without complaints of knee pain. Pt stated that she has notice the "small bumps" at the scar go away post scar message. Pt states independent walking without AD besides steps into her house without railing.    Pertinent History Pt is currently amb with FWW during home and community amb. Pt requires assist with transportation at this time. [place] and [place]. Pt has prolonged history of bilat knee pain 2/2 OA with accompanying heel pain.    Limitations Walking;Lifting;Standing;House hold activities;Other (comment)   Walking on the beach.   How long  can you sit comfortably? WNL    How long can you stand comfortably? 10 mins    How long can you walk comfortably? 120 ft with FWW    Diagnostic tests n/a    Patient Stated Goals Return to walking on the beach Enjoy life without having to need seated breaks.    Currently in Pain? No/denies    Pain Score 0-No pain             There.ex.:   Nustep L5 seat position 8 for 10 min without UE assist. (Consistent cadence/ no rest breaks).   //-bar: high marching/knee flexion/6'' hurdle forward/6'' hurdles lateral.  Vebral cueing to ensure optimal LE alignment for proper tissue alignment. 3 laps each movement   12'' step up without UE assist and verbal cueing to ensure proper stepping mechanics. 1 min x 3. CGA with gait belt    TG x40 with therapist SBA via handle. Verbal and contact cueing needed to facilitate optimal LE alignment.     Stair training CGA with gait belt donned. 3x with single UE assist. x5 without UE and extensive verbal cueing for optimal sequence of step and LE alignment for proper LE loading.        PT Education - 09/25/20 1625     Education Details Pt was educated on proper LE sequencing with stairs without rail assistance.    Person(s) Educated Patient    Methods Explanation;Demonstration;Tactile cues;Verbal cues    Comprehension Verbalized understanding;Returned demonstration;Need further instruction                 PT Long Term Goals - 09/18/20 1205       PT LONG TERM GOAL #1   Title Pt will increase FOTO score to 72 to promote pt report return to independence with ADLs    Baseline 67: 6/8: 63    Time 8    Period Weeks    Status Deferred    Target Date 10/12/20      PT LONG TERM GOAL #2   Title Pt will increase LLE MMT equal to right to promote return to high level gait distances.    Baseline R/L  4/4 Hip flexion  5/5 Hip abduction seated  5/5 Hip adduction seated  5/3 Knee extension  5/3 Knee flexion 5/5 Ankle dorsiflexion  5/5 Ankle plantarflexion *no resistance applied 9/8: R/L 4/4+ hip flexion, 5/4+ Knee extension  5/+ Knee flexion    Time 8    Period Weeks    Status Partially Met    Target Date 10/12/20      PT LONG TERM GOAL #3   Title Pt will displayed reduced swelling in LLE so that circumference measures equal the RLE to promote increase ROM and normalized gait.    Baseline R/L 5cm above superior patellar pole 48/54, R/L mid patella 44/50, R/L tibial tuberosity: 39/42     9/8: R/L 5cm above superior patellar pole 48/54, R/L mid patella 44/47,  R/L tibial tuberosity: 39/40    Time 8    Period Weeks    Status Partially Met    Target Date 10/12/20      PT LONG TERM GOAL #4   Title PT will improve L knee ROM equal to R for decreased crouched gait during standing and gait activities    Baseline Knee Flexion (0-135): R:105 L:90 (95 PROM)  Knee Extension (0): R: +15 L: +20. 9/6: Knee Flexion (0-135): R:105 L:112 (116 PROM)  Knee Extension (0): R: +15 L: +14 (  8 prom)    Time 8    Period Weeks    Status New    Target Date 10/12/20      PT LONG TERM GOAL #5   Title PT will be able to amb 1041f  without AD to promote return amb on the beach during leisure visits    Baseline 300 ft with FWW SBA 9/8: 500 ft SBA without AD    Time 8    Period Weeks    Status Partially Met    Target Date 10/12/20      PT LONG TERM GOAL #6   Title Pt will improve 5xSTS by at least 3 seconds to promote increased LE power during transfers and gait.    Baseline 8/16 19.97 6/8: 10.38    Time 8    Period Weeks    Status New    Target Date 10/12/20                   Plan - 09/25/20 1627     Clinical Impression Statement Pt tolerated tx very well with stair training without UE assist. Pt was able to complete all stair training and toe tapping without UE assist and without LOB. Manual tissue mobility with OP was deferred to next tx to allow more time for therex. Pt continues to display mark LE endurance, strength, and ROM deficits resulting in decreased safe home/community mobiity, increased pain, and limited access to QOL. Pt. will continue to benefit from skilled physical therapy to progress POC to address remaining deficits to facilitate maximum functional capacity for optimal personal health and wellness for ADLs.    Personal Factors and Comorbidities Social Background;Transportation    Examination-Activity Limitations Bend;Stairs;Stand;Squat;Locomotion Level    Stability/Clinical Decision Making Evolving/Moderate complexity    Clinical  Decision Making Moderate    Rehab Potential Good    PT Frequency 2x / week    PT Duration 8 weeks    PT Treatment/Interventions ADLs/Self Care Home Management;Aquatic Therapy;Cryotherapy;Moist Heat;Gait training;DME Instruction;Neuromuscular re-education;Balance training;Therapeutic exercise;Therapeutic activities;Functional mobility training;Stair training;Patient/family education;Manual techniques;Passive range of motion;Biofeedback;Electrical Stimulation;Traction;Dry needling;Energy conservation;Taping;Joint Manipulations;Other (comment);Spinal Manipulations    PT Next Visit Plan Progression LE therex.    PT Home Exercise Plan [id number]    Consulted and Agree with Plan of Care Patient             Patient will benefit from skilled therapeutic intervention in order to improve the following deficits and impairments:  Abnormal gait, Decreased range of motion, Difficulty walking, Impaired tone, Decreased endurance, Obesity, Pain, Decreased activity tolerance, Decreased balance, Hypomobility, Impaired flexibility, Improper body mechanics, Postural dysfunction, Decreased mobility, Decreased strength, Decreased skin integrity, Increased edema  Visit Diagnosis: Knee joint stiffness, bilateral  Acute pain of left knee  Chronic pain of both knees  Muscle weakness (generalized)  Pain of left heel  Gait difficulty     Problem List Patient Active Problem List   Diagnosis Date Noted   Osteoarthritis of knee 08/13/2020   OA (osteoarthritis) of knee 08/11/2020   Primary osteoarthritis of left knee 08/11/2020   SOB (shortness of breath) 10/10/2017   Coronary artery calcification 09/19/2016   Chest pain 02/03/2016   Elevated troponin 02/03/2016   Anxiety 02/03/2016   Essential hypertension 02/03/2016   Mixed hyperlipidemia 02/03/2016   Chest tightness 01/01/2016   MPura Spice PT, DPT # [id number] Olden SPT 09/26/2020, 1:40 PM  [place] [facility] [place] [address]Phone: [phone]  Fax:  [phone]  Name: Joanne Lewis MRN: [id number] Date of Birth: Sep 24, 1945

## 2020-09-30 ENCOUNTER — Encounter: Payer: Self-pay | Admitting: Physical Therapy

## 2020-09-30 ENCOUNTER — Ambulatory Visit: Payer: Medicare Other | Admitting: Physical Therapy

## 2020-09-30 ENCOUNTER — Other Ambulatory Visit: Payer: Self-pay

## 2020-09-30 DIAGNOSIS — G8929 Other chronic pain: Secondary | ICD-10-CM

## 2020-09-30 DIAGNOSIS — M25562 Pain in left knee: Secondary | ICD-10-CM

## 2020-09-30 DIAGNOSIS — M25661 Stiffness of right knee, not elsewhere classified: Secondary | ICD-10-CM | POA: Diagnosis not present

## 2020-09-30 DIAGNOSIS — M6281 Muscle weakness (generalized): Secondary | ICD-10-CM

## 2020-09-30 DIAGNOSIS — M79672 Pain in left foot: Secondary | ICD-10-CM

## 2020-09-30 DIAGNOSIS — M25662 Stiffness of left knee, not elsewhere classified: Secondary | ICD-10-CM

## 2020-09-30 DIAGNOSIS — R269 Unspecified abnormalities of gait and mobility: Secondary | ICD-10-CM

## 2020-09-30 NOTE — Therapy (Signed)
[facility] [address] [place], [place], [address] Phone: [phone]   Fax:  [phone]  Physical Therapy Treatment  Patient Details  Name: ALANNA STORTI MRN: [id number] Date of Birth: December 30, 1945 Referring Provider (PT): Jonnie Kind, [place]   Encounter Date: 09/30/2020   PT End of Session - 09/30/20 1122     Visit Number 13    Number of Visits 16    Date for PT Re-Evaluation 10/14/20    Authorization Type Traditional medicare A&B    Authorization - Visit Number 3    Authorization - Number of Visits 10    PT Start Time 2703    PT Stop Time 1201    PT Time Calculation (min) 46 min    Equipment Utilized During Treatment Gait belt    Activity Tolerance Patient tolerated treatment well;Patient limited by fatigue    Behavior During Therapy [facility] for tasks assessed/performed;Anxious   Pt displays mod anxious during eval about the entire TKA process.            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[place])    Arthritis    Branch retinal artery occlusion, left eye    Chronic kidney disease    moderate   Fatigue    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 11/25/2017   Hyperkalemia    Hyperlipidemia    Hypertension    Pneumonia    at least 10 years ago   Pre-diabetes     Past Surgical History:  Procedure Laterality Date   COLONOSCOPY WITH PROPOFOL N/A 08/09/2016   Procedure: COLONOSCOPY WITH PROPOFOL;  Surgeon: Manya Silvas, MD;  Location: [facility] ENDOSCOPY;  Service: Endoscopy;  Laterality: N/A;   DILATATION & CURETTAGE/HYSTEROSCOPY WITH MYOSURE N/A 02/20/2018   Procedure: DILATATION & CURETTAGE/HYSTEROSCOPY WITH MYOSURE;  Surgeon: Schermerhorn, Gwen Her, MD;  Location: [facility] ORS;  Service: Gynecology;  Laterality: N/A;   DILATION AND CURETTAGE OF UTERUS N/A 02/20/2018   Procedure: DILATATION AND CURETTAGE, fractional, resection of endometrial mass, possible  myosure, hysteroscopy;  Surgeon: Schermerhorn, Gwen Her, MD;  Location: [facility] ORS;  Service: Gynecology;  Laterality: N/A;   FEMORAL HERNIA REPAIR Right 11/26/2017   Procedure: HERNIA REPAIR FEMORAL with insertion or mesh;  Surgeon: Vickie Epley, MD;  Location: [facility] ORS;  Service: General;  Laterality: Right;   HYSTEROSCOPY WITH D & C N/A 02/20/2018   Procedure: DILATATION AND CURETTAGE /HYSTEROSCOPY,;  Surgeon: Schermerhorn, Gwen Her, MD;  Location: [facility] ORS;  Service: Gynecology;  Laterality: N/A;   TOTAL KNEE ARTHROPLASTY Left 08/11/2020   Procedure: TOTAL KNEE ARTHROPLASTY;  Surgeon: Gaynelle Arabian, MD;  Location: [facility];  Service: Orthopedics;  Laterality: Left;    There were no vitals filed for this visit.   Subjective Assessment - 09/30/20 1120     Subjective Pt presents to tx without reports of knee pain and increased access to functional tasks. Pt states that she had a rash break out on her chest and sore on her lip, Pt denies fever or feeling sick. Pt scheduled a visit with her PCP for further assessment.    Pertinent History Pt is currently amb with FWW during home and community amb. Pt requires assist with transportation at this time. [place] and [place]. Pt has prolonged history of bilat knee pain 2/2 OA with accompanying heel pain.    Limitations Walking;Lifting;Standing;House hold activities;Other (comment)  Walking on the beach.   How long can you sit comfortably? WNL    How long can you stand comfortably? 10 mins    How long can you walk comfortably? 120 ft with FWW    Diagnostic tests n/a    Patient Stated Goals Return to walking on the beach Enjoy life without having to need seated breaks.    Currently in Pain? No/denies    Pain Score 0-No pain               There.ex.:   Nustep L5 seat position 8 for 10 min without UE assist. (Consistent cadence/ no rest breaks).   //-bar: walking lunges.Janey Genta cueing to  ensure optimal LE alignment for proper tissue alignment. 3 laps each movement   STS from standard height chair without UE assist 3x10. Verbal and contact cueing to facilitate optimal tissue loading and correct biomechanical alignment to ensure efficient and safe movement.   2 laps around the clinic stool scoots for facilitate dynamic hamstring strength.   Therapist OP prolong stretching L knee flexion/extension. Intensity of stretch was monitored by pt sx tolerance.      PT Long Term Goals - 09/18/20 1205       PT LONG TERM GOAL #1   Title Pt will increase FOTO score to 72 to promote pt report return to independence with ADLs    Baseline 67: 6/8: 63    Time 8    Period Weeks    Status Deferred    Target Date 10/12/20      PT LONG TERM GOAL #2   Title Pt will increase LLE MMT equal to right to promote return to high level gait distances.    Baseline R/L  4/4 Hip flexion  5/5 Hip abduction seated  5/5 Hip adduction seated  5/3 Knee extension  5/3 Knee flexion 5/5 Ankle dorsiflexion  5/5 Ankle plantarflexion *no resistance applied 9/8: R/L 4/4+ hip flexion, 5/4+ Knee extension  5/+ Knee flexion    Time 8    Period Weeks    Status Partially Met    Target Date 10/12/20      PT LONG TERM GOAL #3   Title Pt will displayed reduced swelling in LLE so that circumference measures equal the RLE to promote increase ROM and normalized gait.    Baseline R/L 5cm above superior patellar pole 48/54, R/L mid patella 44/50, R/L tibial tuberosity: 39/42     9/8: R/L 5cm above superior patellar pole 48/54, R/L mid patella 44/47, R/L tibial tuberosity: 39/40    Time 8    Period Weeks    Status Partially Met    Target Date 10/12/20      PT LONG TERM GOAL #4   Title PT will improve L knee ROM equal to R for decreased crouched gait during standing and gait activities    Baseline Knee Flexion (0-135): R:105 L:90 (95 PROM)  Knee Extension (0): R: +15 L: +20. 9/6: Knee Flexion (0-135): R:105 L:112 (116  PROM)  Knee Extension (0): R: +15 L: +14 (8 prom)    Time 8    Period Weeks    Status New    Target Date 10/12/20      PT LONG TERM GOAL #5   Title PT will be able to amb 1041f  without AD to promote return amb on the beach during leisure visits    Baseline 300 ft with FWW SBA 9/8: 500 ft SBA without AD    Time  8    Period Weeks    Status Partially Met    Target Date 10/12/20      PT LONG TERM GOAL #6   Title Pt will improve 5xSTS by at least 3 seconds to promote increased LE power during transfers and gait.    Baseline 8/16 19.97 6/8: 10.38    Time 8    Period Weeks    Status New    Target Date 10/12/20                   Plan - 09/30/20 1124     Clinical Impression Statement Today's tx was focused on facilitating greater hamstring dynamic mobility. Pts rash was observed at bilat upper chest and pt was encourage to keep her PCP visit for greater assessment. Pt continues to display mark LE endurance, strength, and ROM deficits resulting in decreased safe home/community mobiity, increased pain, and limited access to QOL.  Pt. will continue to benefit from skilled physical therapy to progress POC to address remaining deficits to facilitate maximum functional capacity for optimal personal health and wellness for ADLs.    Personal Factors and Comorbidities Social Background;Transportation    Examination-Activity Limitations Bend;Stairs;Stand;Squat;Locomotion Level    Stability/Clinical Decision Making Evolving/Moderate complexity    Clinical Decision Making Moderate    Rehab Potential Good    PT Frequency 2x / week    PT Duration 8 weeks    PT Treatment/Interventions ADLs/Self Care Home Management;Aquatic Therapy;Cryotherapy;Moist Heat;Gait training;DME Instruction;Neuromuscular re-education;Balance training;Therapeutic exercise;Therapeutic activities;Functional mobility training;Stair training;Patient/family education;Manual techniques;Passive range of  motion;Biofeedback;Electrical Stimulation;Traction;Dry needling;Energy conservation;Taping;Joint Manipulations;Other (comment);Spinal Manipulations    PT Next Visit Plan Progression LE therex.    PT Home Exercise Plan [id number]    Consulted and Agree with Plan of Care Patient             Patient will benefit from skilled therapeutic intervention in order to improve the following deficits and impairments:  Abnormal gait, Decreased range of motion, Difficulty walking, Impaired tone, Decreased endurance, Obesity, Pain, Decreased activity tolerance, Decreased balance, Hypomobility, Impaired flexibility, Improper body mechanics, Postural dysfunction, Decreased mobility, Decreased strength, Decreased skin integrity, Increased edema  Visit Diagnosis: Knee joint stiffness, bilateral  Pain of left heel  Gait difficulty  Acute pain of left knee  Chronic pain of both knees  Muscle weakness (generalized)     Problem List Patient Active Problem List   Diagnosis Date Noted   Osteoarthritis of knee 08/13/2020   OA (osteoarthritis) of knee 08/11/2020   Primary osteoarthritis of left knee 08/11/2020   SOB (shortness of breath) 10/10/2017   Coronary artery calcification 09/19/2016   Chest pain 02/03/2016   Elevated troponin 02/03/2016   Anxiety 02/03/2016   Essential hypertension 02/03/2016   Mixed hyperlipidemia 02/03/2016   Chest tightness 01/01/2016   Pura Spice, PT, DPT # [id number] Fara Olden, SPT 09/30/2020, 12:45 PM  [place] [facility] [address] [place], [place], [address] Phone: [phone]   Fax:  [phone]  Name: MAKESHA BELITZ MRN: [id number] Date of Birth: 1945-06-26

## 2020-10-02 ENCOUNTER — Encounter: Payer: Self-pay | Admitting: Physical Therapy

## 2020-10-02 ENCOUNTER — Ambulatory Visit: Payer: Medicare Other | Admitting: Physical Therapy

## 2020-10-02 ENCOUNTER — Other Ambulatory Visit: Payer: Self-pay

## 2020-10-02 DIAGNOSIS — G8929 Other chronic pain: Secondary | ICD-10-CM

## 2020-10-02 DIAGNOSIS — R269 Unspecified abnormalities of gait and mobility: Secondary | ICD-10-CM

## 2020-10-02 DIAGNOSIS — M79672 Pain in left foot: Secondary | ICD-10-CM

## 2020-10-02 DIAGNOSIS — M25562 Pain in left knee: Secondary | ICD-10-CM

## 2020-10-02 DIAGNOSIS — M25661 Stiffness of right knee, not elsewhere classified: Secondary | ICD-10-CM

## 2020-10-02 DIAGNOSIS — M6281 Muscle weakness (generalized): Secondary | ICD-10-CM

## 2020-10-02 NOTE — Therapy (Signed)
[place] [facility] [address] [place], [place], [address] Phone: [phone]   Fax:  [phone]  Physical Therapy Treatment  Patient Details  Name: Joanne Lewis MRN: [id number] Date of Birth: [birth year] Referring Provider (PT): Jonnie Kind, [place]   Encounter Date: 10/02/2020   PT End of Session - 10/02/20 1128     Visit Number 14    Number of Visits 16    Date for PT Re-Evaluation 10/14/20    Authorization Type Traditional medicare A&B    Authorization - Visit Number 4    Authorization - Number of Visits 10    PT Start Time 9024    PT Stop Time 1202    PT Time Calculation (min) 48 min    Equipment Utilized During Treatment Gait belt    Activity Tolerance Patient tolerated treatment well;Patient limited by fatigue    Behavior During Therapy [facility] for tasks assessed/performed;Anxious   Pt displays mod anxious during eval about the entire TKA process.            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[place])    Arthritis    Branch retinal artery occlusion, left eye    Chronic kidney disease    moderate   Fatigue    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 11/25/2017   Hyperkalemia    Hyperlipidemia    Hypertension    Pneumonia    at least 10 years ago   Pre-diabetes     Past Surgical History:  Procedure Laterality Date   COLONOSCOPY WITH PROPOFOL N/A 08/09/2016   Procedure: COLONOSCOPY WITH PROPOFOL;  Surgeon: Manya Silvas, MD;  Location: [facility] ENDOSCOPY;  Service: Endoscopy;  Laterality: N/A;   DILATATION & CURETTAGE/HYSTEROSCOPY WITH MYOSURE N/A 02/20/2018   Procedure: DILATATION & CURETTAGE/HYSTEROSCOPY WITH MYOSURE;  Surgeon: Schermerhorn, Gwen Her, MD;  Location: [facility] ORS;  Service: Gynecology;  Laterality: N/A;   DILATION AND CURETTAGE OF UTERUS N/A 02/20/2018   Procedure: DILATATION AND CURETTAGE, fractional, resection of endometrial mass, possible  myosure, hysteroscopy;  Surgeon: Schermerhorn, Gwen Her, MD;  Location: [facility] ORS;  Service: Gynecology;  Laterality: N/A;   FEMORAL HERNIA REPAIR Right 11/26/2017   Procedure: HERNIA REPAIR FEMORAL with insertion or mesh;  Surgeon: Vickie Epley, MD;  Location: [facility] ORS;  Service: General;  Laterality: Right;   HYSTEROSCOPY WITH D & C N/A 02/20/2018   Procedure: DILATATION AND CURETTAGE /HYSTEROSCOPY,;  Surgeon: Schermerhorn, Gwen Her, MD;  Location: [facility] ORS;  Service: Gynecology;  Laterality: N/A;   TOTAL KNEE ARTHROPLASTY Left 08/11/2020   Procedure: TOTAL KNEE ARTHROPLASTY;  Surgeon: Gaynelle Arabian, MD;  Location: [facility];  Service: Orthopedics;  Laterality: Left;    There were no vitals filed for this visit.   Subjective Assessment - 10/02/20 1120     Subjective Pt presents to tx without reports of pain but states stiffness in the L knee. Pt states good f/u with PCP in regards to rash/sore on chest and lip. Pt presents with mod emtional states 2/2 to loss in the family.    Pertinent History Pt is currently amb with FWW during home and community amb. Pt requires assist with transportation at this time. [place] and [place]. Pt has prolonged history of bilat knee pain 2/2 OA with accompanying heel pain.    Limitations Walking;Lifting;Standing;House hold activities;Other (comment)   Walking on the beach.  How long can you sit comfortably? WNL    How long can you stand comfortably? 10 mins    How long can you walk comfortably? 120 ft with FWW    Diagnostic tests n/a    Patient Stated Goals Return to walking on the beach Enjoy life without having to need seated breaks.    Currently in Pain? No/denies    Pain Score 0-No pain              There.ex.:   Nustep L5 seat position 8 for 10 min without UE assist. (Consistent cadence/ no rest breaks).    12'' Toe taps from airex pad, light bilat UE assist for balance, and 4# weights bilat.  Verbal and contact cueing to facilitate optimal tissue loading and correct biomechanical alignment to ensure efficient and safe movement.    19# sled push/pull 4x20 ft each movement. Verbal and contact cueing to facilitate optimal tissue loading and correct biomechanical alignment to ensure efficient and safe movement.   Box carry 43f around clinic 7#, 12#, 17#, 22#, 27# SBA with verbal cueing to protective back and knee movement with proper LE alignment.   Box pick up from ground to 1 level wired shelf(344f 7#, 12#, 17#, 22#, 27# SBA with verbal cueing to protective back and knee movement with proper LE alignment.    Therapist OP prolong stretching L knee flexion/extension with yellow ball curls. Intensity of stretch was monitored by pt sx tolerance.        PT Long Term Goals - 09/18/20 1205       PT LONG TERM GOAL #1   Title Pt will increase FOTO score to 72 to promote pt report return to independence with ADLs    Baseline 67: 6/8: 63    Time 8    Period Weeks    Status Deferred    Target Date 10/12/20      PT LONG TERM GOAL #2   Title Pt will increase LLE MMT equal to right to promote return to high level gait distances.    Baseline R/L  4/4 Hip flexion  5/5 Hip abduction seated  5/5 Hip adduction seated  5/3 Knee extension  5/3 Knee flexion 5/5 Ankle dorsiflexion  5/5 Ankle plantarflexion *no resistance applied 9/8: R/L 4/4+ hip flexion, 5/4+ Knee extension  5/+ Knee flexion    Time 8    Period Weeks    Status Partially Met    Target Date 10/12/20      PT LONG TERM GOAL #3   Title Pt will displayed reduced swelling in LLE so that circumference measures equal the RLE to promote increase ROM and normalized gait.    Baseline R/L 5cm above superior patellar pole 48/54, R/L mid patella 44/50, R/L tibial tuberosity: 39/42     9/8: R/L 5cm above superior patellar pole 48/54, R/L mid patella 44/47, R/L tibial tuberosity: 39/40    Time 8    Period Weeks    Status Partially Met     Target Date 10/12/20      PT LONG TERM GOAL #4   Title PT will improve L knee ROM equal to R for decreased crouched gait during standing and gait activities    Baseline Knee Flexion (0-135): R:105 L:90 (95 PROM)  Knee Extension (0): R: +15 L: +20. 9/6: Knee Flexion (0-135): R:105 L:112 (116 PROM)  Knee Extension (0): R: +15 L: +14 (8 prom)    Time 8    Period Weeks  Status New    Target Date 10/12/20      PT LONG TERM GOAL #5   Title PT will be able to amb 1052f  without AD to promote return amb on the beach during leisure visits    Baseline 300 ft with FWW SBA 9/8: 500 ft SBA without AD    Time 8    Period Weeks    Status Partially Met    Target Date 10/12/20      PT LONG TERM GOAL #6   Title Pt will improve 5xSTS by at least 3 seconds to promote increased LE power during transfers and gait.    Baseline 8/16 19.97 6/8: 10.38    Time 8    Period Weeks    Status New    Target Date 10/12/20                   Plan - 10/02/20 1218     Clinical Impression Statement Today's tx was focused on functional strengthening with box therex. Pt stated no increase in pain post tx. Pt continues to display mark LE endurance, strength, and ROM deficits resulting in decreased safe home/community mobiity, increased pain, and limited access to QOL. Pt. will continue to benefit from skilled physical therapy to progress POC to address remaining deficits to facilitate maximum functional capacity for optimal personal health and wellness for ADLs.    Personal Factors and Comorbidities Social Background;Transportation    Examination-Activity Limitations Bend;Stairs;Stand;Squat;Locomotion Level    Stability/Clinical Decision Making Evolving/Moderate complexity    Clinical Decision Making Moderate    Rehab Potential Good    PT Frequency 2x / week    PT Duration 8 weeks    PT Treatment/Interventions ADLs/Self Care Home Management;Aquatic Therapy;Cryotherapy;Moist Heat;Gait training;DME  Instruction;Neuromuscular re-education;Balance training;Therapeutic exercise;Therapeutic activities;Functional mobility training;Stair training;Patient/family education;Manual techniques;Passive range of motion;Biofeedback;Electrical Stimulation;Traction;Dry needling;Energy conservation;Taping;Joint Manipulations;Other (comment);Spinal Manipulations    PT Next Visit Plan Reassess soreness after previous tx. reassess ROM.    PT Home Exercise Plan [id number]    Consulted and Agree with Plan of Care Patient             Patient will benefit from skilled therapeutic intervention in order to improve the following deficits and impairments:  Abnormal gait, Decreased range of motion, Difficulty walking, Impaired tone, Decreased endurance, Obesity, Pain, Decreased activity tolerance, Decreased balance, Hypomobility, Impaired flexibility, Improper body mechanics, Postural dysfunction, Decreased mobility, Decreased strength, Decreased skin integrity, Increased edema  Visit Diagnosis: Knee joint stiffness, bilateral  Chronic pain of both knees  Muscle weakness (generalized)  Pain of left heel  Gait difficulty  Acute pain of left knee     Problem List Patient Active Problem List   Diagnosis Date Noted   Osteoarthritis of knee 08/13/2020   OA (osteoarthritis) of knee 08/11/2020   Primary osteoarthritis of left knee 08/11/2020   SOB (shortness of breath) 10/10/2017   Coronary artery calcification 09/19/2016   Chest pain 02/03/2016   Elevated troponin 02/03/2016   Anxiety 02/03/2016   Essential hypertension 02/03/2016   Mixed hyperlipidemia 02/03/2016   Chest tightness 01/01/2016   MPura Spice PT, DPT # [id number] Olden SPT 10/02/2020, 1:03 PM  [place] [facility] [place] [address]Phone: [phone]  Fax:  [phone] Name: Joanne DELIAMRN: [id number] of Birth: [birth year]

## 2020-10-07 ENCOUNTER — Ambulatory Visit: Payer: Medicare Other | Admitting: Physical Therapy

## 2020-10-07 ENCOUNTER — Encounter: Payer: Self-pay | Admitting: Physical Therapy

## 2020-10-07 ENCOUNTER — Other Ambulatory Visit: Payer: Self-pay

## 2020-10-07 DIAGNOSIS — M6281 Muscle weakness (generalized): Secondary | ICD-10-CM

## 2020-10-07 DIAGNOSIS — M25661 Stiffness of right knee, not elsewhere classified: Secondary | ICD-10-CM

## 2020-10-07 DIAGNOSIS — M25562 Pain in left knee: Secondary | ICD-10-CM

## 2020-10-07 DIAGNOSIS — R269 Unspecified abnormalities of gait and mobility: Secondary | ICD-10-CM

## 2020-10-07 DIAGNOSIS — G8929 Other chronic pain: Secondary | ICD-10-CM

## 2020-10-07 DIAGNOSIS — M79672 Pain in left foot: Secondary | ICD-10-CM

## 2020-10-07 NOTE — Therapy (Signed)
[facility] [address] [place], [place], [address] Phone: [phone]   Fax:  [phone]  Physical Therapy Treatment  Patient Details  Name: Joanne Lewis MRN: [id number] Date of Birth: 1945/10/22 Referring Provider (PT): Jonnie Kind, [place]   Encounter Date: 10/07/2020   PT End of Session - 10/07/20 1122     Visit Number 15    Number of Visits 16    Date for PT Re-Evaluation 10/14/20    Authorization Type Traditional medicare A&B    Authorization - Visit Number 5    Authorization - Number of Visits 10    Progress Note Due on Visit 10    PT Start Time 8676    PT Stop Time 1201    PT Time Calculation (min) 48 min    Equipment Utilized During Treatment Gait belt    Activity Tolerance Patient tolerated treatment well;Patient limited by fatigue    Behavior During Therapy [facility] for tasks assessed/performed;Anxious   Pt displays mod anxious during eval about the entire TKA process.            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[place])    Arthritis    Branch retinal artery occlusion, left eye    Chronic kidney disease    moderate   Fatigue    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 11/25/2017   Hyperkalemia    Hyperlipidemia    Hypertension    Pneumonia    at least 10 years ago   Pre-diabetes     Past Surgical History:  Procedure Laterality Date   COLONOSCOPY WITH PROPOFOL N/A 08/09/2016   Procedure: COLONOSCOPY WITH PROPOFOL;  Surgeon: Manya Silvas, MD;  Location: [facility] ENDOSCOPY;  Service: Endoscopy;  Laterality: N/A;   DILATATION & CURETTAGE/HYSTEROSCOPY WITH MYOSURE N/A 02/20/2018   Procedure: DILATATION & CURETTAGE/HYSTEROSCOPY WITH MYOSURE;  Surgeon: Schermerhorn, Gwen Her, MD;  Location: [facility] ORS;  Service: Gynecology;  Laterality: N/A;   DILATION AND CURETTAGE OF UTERUS N/A 02/20/2018   Procedure: DILATATION AND CURETTAGE, fractional, resection of  endometrial mass, possible myosure, hysteroscopy;  Surgeon: Schermerhorn, Gwen Her, MD;  Location: [facility] ORS;  Service: Gynecology;  Laterality: N/A;   FEMORAL HERNIA REPAIR Right 11/26/2017   Procedure: HERNIA REPAIR FEMORAL with insertion or mesh;  Surgeon: Vickie Epley, MD;  Location: [facility] ORS;  Service: General;  Laterality: Right;   HYSTEROSCOPY WITH D & C N/A 02/20/2018   Procedure: DILATATION AND CURETTAGE /HYSTEROSCOPY,;  Surgeon: Schermerhorn, Gwen Her, MD;  Location: [facility] ORS;  Service: Gynecology;  Laterality: N/A;   TOTAL KNEE ARTHROPLASTY Left 08/11/2020   Procedure: TOTAL KNEE ARTHROPLASTY;  Surgeon: Gaynelle Arabian, MD;  Location: [facility] ORS;  Service: Orthopedics;  Laterality: Left;    There were no vitals filed for this visit.   Subjective Assessment - 10/07/20 1120     Subjective Presents to tx without reports of L knee pain. Pt states occ L heel pain but is not getting in the way of functional activity. Pt states no abnormal soreness after last tx.    Pertinent History Pt is currently amb with FWW during home and community amb. Pt requires assist with transportation at this time. [place]q [place] and leisure. Pt has prolonged history of bilat knee pain 2/2 OA with accompanying heel pain.    Limitations Walking;Lifting;Standing;House hold activities;Other (comment)   Walking on the beach.  How long can you sit comfortably? WNL    How long can you stand comfortably? 10 mins    How long can you walk comfortably? 120 ft with FWW    Diagnostic tests n/a    Patient Stated Goals Return to walking on the beach Enjoy life without having to need seated breaks.    Currently in Pain? No/denies    Pain Score 0-No pain              There.ex.:   Nustep L5 seat position 8 for 10 min without UE assist. (Consistent cadence/ no rest breaks).    Outdoor amb over uneven ground ~1500 ft with SBA and occ verbal cueing to ensure proper stepping  patterns over objects. 5 min seated rest breaks done between 2/2 chronic SOB.   12'' Toe taps from airex pad, light R UE assist for balance, and 4# weights bilat. Verbal and contact cueing to facilitate optimal tissue loading and correct biomechanical alignment to ensure efficient and safe movement.    50# sled push/pull 3x20 ft each movement. Verbal and contact cueing to facilitate optimal tissue loading and correct biomechanical alignment to ensure efficient and safe movement.     Therapist OP prolong stretching L knee extension w. Intensity of stretch was monitored by pt sx tolerance.           PT Long Term Goals - 09/18/20 1205       PT LONG TERM GOAL #1   Title Pt will increase FOTO score to 72 to promote pt report return to independence with ADLs    Baseline 67: 6/8: 63    Time 8    Period Weeks    Status Deferred    Target Date 10/12/20      PT LONG TERM GOAL #2   Title Pt will increase LLE MMT equal to right to promote return to high level gait distances.    Baseline R/L  4/4 Hip flexion  5/5 Hip abduction seated  5/5 Hip adduction seated  5/3 Knee extension  5/3 Knee flexion 5/5 Ankle dorsiflexion  5/5 Ankle plantarflexion *no resistance applied 9/8: R/L 4/4+ hip flexion, 5/4+ Knee extension  5/+ Knee flexion    Time 8    Period Weeks    Status Partially Met    Target Date 10/12/20      PT LONG TERM GOAL #3   Title Pt will displayed reduced swelling in LLE so that circumference measures equal the RLE to promote increase ROM and normalized gait.    Baseline R/L 5cm above superior patellar pole 48/54, R/L mid patella 44/50, R/L tibial tuberosity: 39/42     9/8: R/L 5cm above superior patellar pole 48/54, R/L mid patella 44/47, R/L tibial tuberosity: 39/40    Time 8    Period Weeks    Status Partially Met    Target Date 10/12/20      PT LONG TERM GOAL #4   Title PT will improve L knee ROM equal to R for decreased crouched gait during standing and gait activities     Baseline Knee Flexion (0-135): R:105 L:90 (95 PROM)  Knee Extension (0): R: +15 L: +20. 9/6: Knee Flexion (0-135): R:105 L:112 (116 PROM)  Knee Extension (0): R: +15 L: +14 (8 prom)    Time 8    Period Weeks    Status New    Target Date 10/12/20      PT LONG TERM GOAL #5   Title PT will be  able to amb 1076f  without AD to promote return amb on the beach during leisure visits    Baseline 300 ft with FWW SBA 9/8: 500 ft SBA without AD    Time 8    Period Weeks    Status Partially Met    Target Date 10/12/20      PT LONG TERM GOAL #6   Title Pt will improve 5xSTS by at least 3 seconds to promote increased LE power during transfers and gait.    Baseline 8/16 19.97 6/8: 10.38    Time 8    Period Weeks    Status New    Target Date 10/12/20                   Plan - 10/07/20 1123     Clinical Impression Statement Pt tolerated tx with progression of sled push/pull and prolonged outdoor uneven ground amb. Pt is agreeable and displays appropriate functional abilities during tx for possible d/c next tx. Pt continues to display mark LE endurance, strength, and ROM deficits resulting in decreased safe home/community mobiity, increased pain, and limited access to QOL. Pt. will continue to benefit from skilled physical therapy to progress POC to address remaining deficits to facilitate maximum functional capacity for optimal personal health and wellness for ADLs.    Personal Factors and Comorbidities Social Background;Transportation    Examination-Activity Limitations Bend;Stairs;Stand;Squat;Locomotion Level    Stability/Clinical Decision Making Evolving/Moderate complexity    Clinical Decision Making Moderate    Rehab Potential Good    PT Frequency 2x / week    PT Duration 8 weeks    PT Treatment/Interventions ADLs/Self Care Home Management;Aquatic Therapy;Cryotherapy;Moist Heat;Gait training;DME Instruction;Neuromuscular re-education;Balance training;Therapeutic exercise;Therapeutic  activities;Functional mobility training;Stair training;Patient/family education;Manual techniques;Passive range of motion;Biofeedback;Electrical Stimulation;Traction;Dry needling;Energy conservation;Taping;Joint Manipulations;Other (comment);Spinal Manipulations    PT Next Visit Plan REASSESS AND D/C next visit    [facility]   Consulted and Agree with Plan of Care Patient             Patient will benefit from skilled therapeutic intervention in order to improve the following deficits and impairments:  Abnormal gait, Decreased range of motion, Difficulty walking, Impaired tone, Decreased endurance, Obesity, Pain, Decreased activity tolerance, Decreased balance, Hypomobility, Impaired flexibility, Improper body mechanics, Postural dysfunction, Decreased mobility, Decreased strength, Decreased skin integrity, Increased edema  Visit Diagnosis: Knee joint stiffness, bilateral  Acute pain of left knee  Gait difficulty  Chronic pain of both knees  Muscle weakness (generalized)  Pain of left heel     Problem List Patient Active Problem List   Diagnosis Date Noted   Osteoarthritis of knee 08/13/2020   OA (osteoarthritis) of knee 08/11/2020   Primary osteoarthritis of left knee 08/11/2020   SOB (shortness of breath) 10/10/2017   Coronary artery calcification 09/19/2016   Chest pain 02/03/2016   Elevated troponin 02/03/2016   Anxiety 02/03/2016   Essential hypertension 02/03/2016   Mixed hyperlipidemia 02/03/2016   Chest tightness 01/01/2016   MPura Spice PT, DPT # [id number] Olden SPT 10/07/2020, 12:44 PM  [place] [facility] [place] [address]Phone: [phone]  Fax:  [phone] Name: VLUCILLE WITTSMRN: [id number] of Birth: 908-25-1947

## 2020-10-09 ENCOUNTER — Ambulatory Visit: Payer: Medicare Other | Admitting: Physical Therapy

## 2020-10-09 ENCOUNTER — Encounter: Payer: Self-pay | Admitting: Physical Therapy

## 2020-10-09 ENCOUNTER — Other Ambulatory Visit: Payer: Self-pay

## 2020-10-09 DIAGNOSIS — G8929 Other chronic pain: Secondary | ICD-10-CM

## 2020-10-09 DIAGNOSIS — M25562 Pain in left knee: Secondary | ICD-10-CM

## 2020-10-09 DIAGNOSIS — M6281 Muscle weakness (generalized): Secondary | ICD-10-CM

## 2020-10-09 DIAGNOSIS — R269 Unspecified abnormalities of gait and mobility: Secondary | ICD-10-CM

## 2020-10-09 DIAGNOSIS — M79672 Pain in left foot: Secondary | ICD-10-CM

## 2020-10-09 DIAGNOSIS — M25661 Stiffness of right knee, not elsewhere classified: Secondary | ICD-10-CM

## 2020-10-09 NOTE — Therapy (Signed)
[facility] [address] [place], [place], [address] Phone: [phone]   Fax:  [phone]  Physical Therapy Treatment and Discharge [place] 10/09/20.  Patient Details  Name: Joanne Lewis MRN: [id number] Date of Birth: 1945/09/15 Referring Provider (PT): Jonnie Kind, [place]   Encounter Date: 10/09/2020   PT End of Session - 10/09/20 1126     Visit Number 16    Number of Visits 16    Date for PT Re-Evaluation 10/14/20    Authorization Type Traditional medicare A&B    Authorization - Visit Number 6    Authorization - Number of Visits 10    Progress Note Due on Visit 10    PT Start Time 5750    PT Stop Time 1202    PT Time Calculation (min) 46 min    Equipment Utilized During Treatment Gait belt    Activity Tolerance Patient tolerated treatment well;Patient limited by fatigue    Behavior During Therapy [facility] for tasks assessed/performed;Anxious   Pt displays mod anxious during eval about the entire TKA process.            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[place])    Arthritis    Branch retinal artery occlusion, left eye    Chronic kidney disease    moderate   Fatigue    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 11/25/2017   Hyperkalemia    Hyperlipidemia    Hypertension    Pneumonia    at least 10 years ago   Pre-diabetes     Past Surgical History:  Procedure Laterality Date   COLONOSCOPY WITH PROPOFOL N/A 08/09/2016   Procedure: COLONOSCOPY WITH PROPOFOL;  Surgeon: Manya Silvas, MD;  Location: [facility] END[facility]COPY;  Service: Endoscopy;  Laterality: N/A;   DILATATION & CURETTAGE/HYSTEROSCOPY WITH MYOSURE N/A 02/20/2018   Procedure: DILATATION & CURETTAGE/HYSTEROSCOPY WITH MYOSURE;  Surgeon: Schermerhorn, Gwen Her, MD;  Location: [facility] ORS;  Service: Gynecology;  Laterality: N/A;   DILATION AND CURETTAGE OF UTERUS N/A 02/20/2018   Procedure: DILATATION AND  CURETTAGE, fractional, resection of endometrial mass, possible myosure, hysteroscopy;  Surgeon: Schermerhorn, Gwen Her, MD;  Location: [facility] ORS;  Service: Gynecology;  Laterality: N/A;   FEMORAL HERNIA REPAIR Right 11/26/2017   Procedure: HERNIA REPAIR FEMORAL with insertion or mesh;  Surgeon: Vickie Epley, MD;  Location: [facility] ORS;  Service: General;  Laterality: Right;   HYSTEROSCOPY WITH D & C N/A 02/20/2018   Procedure: DILATATION AND CURETTAGE /HYSTEROSCOPY,;  Surgeon: Schermerhorn, Gwen Her, MD;  Location: [facility] ORS;  Service: Gynecology;  Laterality: N/A;   TOTAL KNEE ARTHROPLASTY Left 08/11/2020   Procedure: TOTAL KNEE ARTHROPLASTY;  Surgeon: Gaynelle Arabian, MD;  Location: [facility] ORS;  Service: Orthopedics;  Laterality: Left;    There were no vitals filed for this visit.   Subjective Assessment - 10/09/20 1122     Subjective Pt present to tx without reports of L knee pain. Pt displays a photo of her scar this morning displaying excellent healing and good adherence to scar massage. Pt is excited and agreeable to d/c today with good adherence to HEP and soon return to pool-based exercise for the maintenance of therapeutic gains.    Pertinent History Pt is currently amb with FWW during home and community amb. Pt requires assist with transportation at this time. [place]q [place] routine maintenance and leisure. Pt has prolonged history of  bilat knee pain 2/2 OA with accompanying heel pain.    Limitations Walking;Lifting;Standing;House hold activities;Other (comment)   Walking on the beach.   How long can you sit comfortably? WNL    How long can you stand comfortably? WNL    How long can you walk comfortably? WNL for community and home distance without AD.    Diagnostic tests n/a    Patient Stated Goals Return to walking on the beach Enjoy life without having to need seated breaks.    Currently in Pain? No/denies    Pain Score 0-No pain               Reassessment  and HEP update:    FOTO: 74  LE MMT: R/L 4+/4+ hip flexion, 5/5 Knee extension 5/5 Knee flexion  Circumference: R/L 5cm above superior patellar pole 48/48, R/L mid patella 44/44, R/L tibial tuberosity: 39/37  LE ROM: Knee Flexion (0-135): R:106 L:1116 (116 PROM) Knee Extension (0): R: +14 L: +9  AMB: pt was able to amb 1555f outdoors without AD.  5xSTS 9.74 seconds  Updated HEP that was reviewed with pt, allowing pt to ask question and request demonstration and or verbal cueing to ensure correct form and technique   Access Code: [email or web address]: [email or web address] Date: 10/09/2020 Prepared by: MDorcas Carrow Exercises  Standing Hip Abduction - 1 x daily - 7 x weekly - 30 reps Standing Hip Extension with Counter Support - 1 x daily - 7 x weekly - 30 reps Standing Hip Flexion with Counter Support - 1 x daily - 7 x weekly - 30 reps Standing Knee Flexion - 1 x daily - 7 x weekly - 30 reps Standing Heel Raises - 1 x daily - 7 x weekly - 30 reps Sit to Stand with Arms Crossed - 1 x daily - 7 x weekly - 30 reps Seated Hamstring Stretch - 1 x daily - 7 x weekly - 3 sets - 30 hold Supine Heel Slide with Strap - 1 x daily - 7 x weekly - 3 sets - 30 hold Supine Bridge - 1 x daily - 7 x weekly - 3 sets - 10 reps Bridge with Hip Abduction and Resistance - 1 x daily - 7 x weekly - 3 sets - 10 reps       PT Education - 10/09/20 1125     Education Details Pt was educated on proper adherence to HEP and soon return to pool exercise to ensure maintenance of gains and preperation for future R TKA.    Person(s) Educated Patient    Methods Explanation;Demonstration;Tactile cues;Verbal cues;Handout    Comprehension Verbalized understanding;Returned demonstration                 PT Long Term Goals - 10/09/20 1133       PT LONG TERM GOAL #1   Title Pt will increase FOTO score to 72 to promote pt report return to independence with ADLs    Baseline 67: 6/8: 63 9/29:  74    Time 8    Period Weeks    Status Achieved    Target Date 10/09/20      PT LONG TERM GOAL #2   Title Pt will increase LLE MMT equal to right to promote return to high level gait distances.    Baseline R/L  4/4 Hip flexion  5/5 Hip abduction seated  5/5 Hip adduction seated  5/3 Knee extension  5/3 Knee flexion 5/5 Ankle dorsiflexion  5/5 Ankle  plantarflexion *no resistance applied 9/8: R/L 4/4+ hip flexion, 5/4+ Knee extension  5/+ Knee flexion 9/29: R/L 4+/4+ hip flexion, 5/5 Knee extension  5/5 Knee flexion    Time 8    Period Weeks    Status Achieved    Target Date 10/09/20      PT LONG TERM GOAL #3   Title Pt will displayed reduced swelling in LLE so that circumference measures equal the RLE to promote increase ROM and normalized gait.    Baseline R/L 5cm above superior patellar pole 48/54, R/L mid patella 44/50, R/L tibial tuberosity: 39/42     9/8: R/L 5cm above superior patellar pole 48/54, R/L mid patella 44/47, R/L tibial tuberosity: 39/40        9/29: R/L 5cm above superior patellar pole 48/48, R/L mid patella 44/44, R/L tibial tuberosity: 39/37    Time 8    Period Weeks    Status Achieved    Target Date 10/09/20      PT LONG TERM GOAL #4   Title PT will improve L knee ROM equal to R for decreased crouched gait during standing and gait activities    Baseline Knee Flexion (0-135): R:105 L:90 (95 PROM)  Knee Extension (0): R: +15 L: +20. 9/6: Knee Flexion (0-135): R:105 L:112 (116 PROM)  Knee Extension (0): R: +15 L: +14 (8 prom)        9/29:  9/6: Knee Flexion (0-135): R:106 L:1116 (116 PROM)  Knee Extension (0): R: +14 L: +9    Time 8    Period Weeks    Status Achieved    Target Date 10/09/20      PT LONG TERM GOAL #5   Title PT will be able to amb 1027f  without AD to promote return amb on the beach during leisure visits    Baseline 300 ft with FWW SBA 9/8: 500 ft SBA without AD 9/29: pt was able to amb 15037foutdoors without AD.    Time 8    Period Weeks     Status Achieved    Target Date 10/09/20      PT LONG TERM GOAL #6   Title Pt will improve 5xSTS by at least 3 seconds to promote increased LE power during transfers and gait.    Baseline 8/16 19.97 6/8: 10.38 9/29: 9.74    Time 8    Period Weeks    Status Achieved    Target Date 10/09/20                 Plan - 10/09/20 1119     Clinical Impression Statement Pt was reassessed and d/c on this date; 10/09/20. Pt displayed excellent improvement with 6/6 goals met and good understanding on the importance of continue HEP, bike, and pool exercising for the maintenance of function. Pt was d/c was great prognosis with return to better than PLOF. Pt was educated to return for further assessment if dramatic decline in function is noted.    Personal Factors and Comorbidities Social Background;Transportation    Examination-Activity Limitations Bend;Stairs;Stand;Squat;Locomotion Level    Stability/Clinical Decision Making Evolving/Moderate complexity    Clinical Decision Making Moderate    Rehab Potential Good    PT Frequency 2x / week    PT Duration 8 weeks    PT Treatment/Interventions ADLs/Self Care Home Management;Aquatic Therapy;Cryotherapy;Moist Heat;Gait training;DME Instruction;Neuromuscular re-education;Balance training;Therapeutic exercise;Therapeutic activities;Functional mobility training;Stair training;Patient/family education;Manual techniques;Passive range of motion;Biofeedback;Electrical Stimulation;Traction;Dry needling;Energy conservation;Taping;Joint Manipulations;Other (comment);Spinal Manipulations    PT Next Visit  Plan Discharge today.  Pt. instructed to contact with any questions or issues.    PT Home Exercise Plan [email or web address]    Consulted and Agree with Plan of Care Patient             Patient will benefit from skilled therapeutic intervention in order to improve the following deficits and impairments:  Abnormal gait, Decreased range of motion, Difficulty walking,  Impaired tone, Decreased endurance, Obesity, Pain, Decreased activity tolerance, Decreased balance, Hypomobility, Impaired flexibility, Improper body mechanics, Postural dysfunction, Decreased mobility, Decreased strength, Decreased skin integrity, Increased edema  Visit Diagnosis: Knee joint stiffness, bilateral  Muscle weakness (generalized)  Pain of left heel  Acute pain of left knee  Gait difficulty  Chronic pain of both knees     Problem List Patient Active Problem List   Diagnosis Date Noted   Osteoarthritis of knee 08/13/2020   OA (osteoarthritis) of knee 08/11/2020   Primary osteoarthritis of left knee 08/11/2020   SOB (shortness of breath) 10/10/2017   Coronary artery calcification 09/19/2016   Chest pain 02/03/2016   Elevated troponin 02/03/2016   Anxiety 02/03/2016   Essential hypertension 02/03/2016   Mixed hyperlipidemia 02/03/2016   Chest tightness 01/01/2016   Pura Spice, PT, DPT # [id number] Fara Olden, SPT 10/09/2020, 12:56 PM  [facility] [address] [place], [place], [address] Phone: [phone]   Fax:  [phone]  Name: Joanne Lewis MRN: [id number] Date of Birth: [birth year]

## 2020-10-09 NOTE — Patient Instructions (Signed)
Access Code: [email or web address] URL: [email or web address] Date: 10/09/2020 Prepared by: Dorene Grebe  Exercises  Standing Hip Abduction - 1 x daily - 7 x weekly - 30 reps Standing Hip Extension with Counter Support - 1 x daily - 7 x weekly - 30 reps Standing Hip Flexion with Counter Support - 1 x daily - 7 x weekly - 30 reps Standing Knee Flexion - 1 x daily - 7 x weekly - 30 reps Standing Heel Raises - 1 x daily - 7 x weekly - 30 reps Sit to Stand with Arms Crossed - 1 x daily - 7 x weekly - 30 reps Seated Hamstring Stretch - 1 x daily - 7 x weekly - 3 sets - 30 hold Supine Heel Slide with Strap - 1 x daily - 7 x weekly - 3 sets - 30 hold Supine Bridge - 1 x daily - 7 x weekly - 3 sets - 10 reps Bridge with Hip Abduction and Resistance - 1 x daily - 7 x weekly - 3 sets - 10 reps

## 2020-11-13 ENCOUNTER — Encounter (HOSPITAL_COMMUNITY): Payer: Self-pay | Admitting: Orthopedic Surgery

## 2021-05-12 ENCOUNTER — Other Ambulatory Visit: Payer: Self-pay | Admitting: Internal Medicine

## 2021-05-12 DIAGNOSIS — Z1231 Encounter for screening mammogram for malignant neoplasm of breast: Secondary | ICD-10-CM

## 2021-07-06 ENCOUNTER — Ambulatory Visit
Admission: RE | Admit: 2021-07-06 | Discharge: 2021-07-06 | Disposition: A | Discharge status: Home / Self Care | Payer: Medicare Other | Source: Ambulatory Visit | Attending: Internal Medicine | Admitting: Internal Medicine

## 2021-07-06 DIAGNOSIS — Z1231 Encounter for screening mammogram for malignant neoplasm of breast: Secondary | ICD-10-CM | POA: Diagnosis present

## 2021-12-08 ENCOUNTER — Ambulatory Visit: Payer: Medicare Other

## 2021-12-08 ENCOUNTER — Ambulatory Visit: Payer: Self-pay

## 2021-12-15 ENCOUNTER — Ambulatory Visit (LOCAL_COMMUNITY_HEALTH_CENTER): Payer: Medicare Other

## 2021-12-15 DIAGNOSIS — Z23 Encounter for immunization: Secondary | ICD-10-CM

## 2021-12-15 DIAGNOSIS — Z719 Counseling, unspecified: Secondary | ICD-10-CM

## 2021-12-15 NOTE — Progress Notes (Signed)
  Are you feeling sick today? No   Have you ever received a dose of COVID-19 Vaccine? [employer], [place], [place], [place], Other) Yes  If yes, which vaccine and how many doses?   Pfizer, 5   Did you bring the vaccination record card or other documentation?  Yes   Do you have a health condition or are undergoing treatment that makes you moderately or severely immunocompromised? This would include, but not be limited to: cancer, HIV, organ transplant, immunosuppressive therapy/high-dose corticosteroids, or moderate/severe primary immunodeficiency.  No  Have you received COVID-19 vaccine before or during hematopoietic cell transplant (HCT) or CAR-T-cell therapies? No  Have you ever had an allergic reaction to: (This would include a severe allergic reaction or a reaction that caused hives, swelling, or respiratory distress, including wheezing.) A component of a COVID-19 vaccine or a previous dose of COVID-19 vaccine? No   Have you ever had an allergic reaction to another vaccine (other thanCOVID-19 vaccine) or an injectable medication? (This would include a severe allergic reaction or a reaction that caused hives, swelling, or respiratory distress, including wheezing.)   No    Do you have a history of any of the following:  Myocarditis or Pericarditis No  Dermal fillers:  No  Multisystem Inflammatory Syndrome (MIS-C or MIS-A)? No  COVID-19 disease within the past 3 months? No  Vaccinated with monkeypox vaccine in the last 4 weeks? No  Eligible, administered Pfizer Comirnaty [phone], monitored, tolerated well. M.Fitzhugh Vizcarrondo, LPN.

## 2022-06-02 ENCOUNTER — Other Ambulatory Visit: Payer: Self-pay | Admitting: Internal Medicine

## 2022-06-02 DIAGNOSIS — Z1231 Encounter for screening mammogram for malignant neoplasm of breast: Secondary | ICD-10-CM

## 2022-06-28 NOTE — Therapy (Signed)
OUTPATIENT PHYSICAL THERAPY LOWER EXTREMITY EVALUATION   Patient Name: Joanne Lewis MRN: [id number] DOB:10/20/1945, 77 y.o., female Today's Date: 06/29/2022  END OF SESSION:  PT End of Session - 06/29/22 1249     Visit Number 1    Number of Visits 13    Date for PT Re-Evaluation 08/10/22    Authorization Type MEDICARE PART A AND B    Progress Note Due on Visit 10    PT Start Time 1300    PT Stop Time 1343    PT Time Calculation (min) 43 min    Activity Tolerance Patient tolerated treatment well    Behavior During Therapy WFL for tasks assessed/performed             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HCC)    Arthritis    Branch retinal artery occlusion, left eye    Chronic kidney disease    moderate   Fatigue    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 11/25/2017   Hyperkalemia    Hyperlipidemia    Hypertension    Pneumonia    at least 10 years ago   Pre-diabetes    Past Surgical History:  Procedure Laterality Date   COLONOSCOPY WITH PROPOFOL N/A 08/09/2016   Procedure: COLONOSCOPY WITH PROPOFOL;  Surgeon: Scot Jun, MD;  Location: [facility] ENDOSCOPY;  Service: Endoscopy;  Laterality: N/A;   DILATATION & CURETTAGE/HYSTEROSCOPY WITH MYOSURE N/A 02/20/2018   Procedure: DILATATION & CURETTAGE/HYSTEROSCOPY WITH MYOSURE;  Surgeon: Schermerhorn, Ihor Austin, MD;  Location: [facility] ORS;  Service: Gynecology;  Laterality: N/A;   DILATION AND CURETTAGE OF UTERUS N/A 02/20/2018   Procedure: DILATATION AND CURETTAGE, fractional, resection of endometrial mass, possible myosure, hysteroscopy;  Surgeon: Schermerhorn, Ihor Austin, MD;  Location: [facility] ORS;  Service: Gynecology;  Laterality: N/A;   FEMORAL HERNIA REPAIR Right 11/26/2017   Procedure: HERNIA REPAIR FEMORAL with insertion or mesh;  Surgeon: Ancil Linsey, MD;  Location: [facility] ORS;  Service: General;  Laterality: Right;   HYSTEROSCOPY WITH D & C N/A 02/20/2018    Procedure: DILATATION AND CURETTAGE /HYSTEROSCOPY,;  Surgeon: Schermerhorn, Ihor Austin, MD;  Location: [facility] ORS;  Service: Gynecology;  Laterality: N/A;   TOTAL KNEE ARTHROPLASTY Left 08/11/2020   Procedure: TOTAL KNEE ARTHROPLASTY;  Surgeon: Ollen Gross, MD;  Location: [facility] ORS;  Service: Orthopedics;  Laterality: Left;   Patient Active Problem List   Diagnosis Date Noted   Osteoarthritis of knee 08/13/2020   OA (osteoarthritis) of knee 08/11/2020   Primary osteoarthritis of left knee 08/11/2020   SOB (shortness of breath) 10/10/2017   Coronary artery calcification 09/19/2016   Chest pain 02/03/2016   Elevated troponin 02/03/2016   Anxiety 02/03/2016   Essential hypertension 02/03/2016   Mixed hyperlipidemia 02/03/2016   Chest tightness 01/01/2016    PCP: Einar Crow, MD  REFERRING PROVIDER: Einar Crow, MD  REFERRING DIAG: [phone] (ICD-10-CM) - Osteoarthrosis, unspecified whether generalized or localized, lower leg M17.11 (ICD-10-CM) - Unilateral primary osteoarthritis, right knee   THERAPY DIAG:  Knee joint stiffness, bilateral - Plan: PT plan of care cert/re-cert  Muscle weakness (generalized) - Plan: PT plan of care cert/re-cert  Gait difficulty - Plan: PT plan of care cert/re-cert  Chronic pain of right knee - Plan: PT plan of care cert/re-cert  Rationale for Evaluation and Treatment: Rehabilitation  ONSET DATE: "years and years"   SUBJECTIVE:   SUBJECTIVE STATEMENT: Patient reports difficulty with standing for long periods of  time and hurts a lot more with squatting to garden or pick up objects. Feels that it is grinding when she is bending over. Unable to stand >10-15 minutes due to R knee pain. After L TKA, patient has more mobility but feels that she is overcompensating on L for the R knee pain. Patient reports tearing a muscle in her back late April/early May 2024 after pulling up a strong weed in her yard.   PERTINENT HISTORY: Planned R TKA on  08/16/22 by Dr. Trudee Grip. S/p L TKA on 08/11/2020 PAIN:  Are you having pain? Yes: NPRS scale: 4/10 Pain location: R knee pain  Pain description: dull ache Aggravating factors: standing, squatting Relieving factors: sitting   PRECAUTIONS: Fall  WEIGHT BEARING RESTRICTIONS: No  FALLS:  Has patient fallen in last 6 months? No  LIVING ENVIRONMENT: Lives with: lives alone Lives in: House/apartment Stairs: Yes: Internal: 3 steps; none Has following equipment at home: Single point cane and Elisea Khader - 2 wheeled  OCCUPATION: retired   PLOF: Independent  PATIENT GOALS: be more mobile, walk with less pain, stand longer and have more stamina   NEXT MD VISIT: 07/2022  OBJECTIVE:   DIAGNOSTIC FINDINGS: N/A  PATIENT SURVEYS:  FOTO 50  COGNITION: Overall cognitive status: Within functional limits for tasks assessed     SENSATION: WFL  POSTURE: rounded shoulders and forward head  PALPATION: No TTP  LOWER EXTREMITY ROM:  Active ROM Right eval Left eval  Hip flexion    Hip extension    Hip abduction    Hip adduction    Hip internal rotation    Hip external rotation    Knee flexion    Knee extension    Ankle dorsiflexion    Ankle plantarflexion    Ankle inversion    Ankle eversion     (Blank rows = not tested)  LOWER EXTREMITY MMT:  MMT Right eval Left eval  Hip flexion 4 4+  Hip extension    Hip abduction 4 4+  Hip adduction    Hip internal rotation    Hip external rotation    Knee flexion 4+ 4+  Knee extension 4+ 4+  Ankle dorsiflexion    Ankle plantarflexion    Ankle inversion    Ankle eversion     (Blank rows = not tested)  FUNCTIONAL TESTS:  5 times sit to stand: 12.5 seconds 6 minute walk test: 1,085'   GAIT: Distance walked: 13' Assistive device utilized: None Level of assistance: Modified independence Comments: R lateral lean, decreased stance time on R, decreased DF on R   TODAY'S TREATMENT:                                                                                                                               DATE: 06/29/22  HEP handout provided and provided demonstration of exercises   PATIENT EDUCATION:  Education details: HEP, POC, goals Person educated: Patient Education method: Explanation,  Demonstration, and Handouts Education comprehension: verbalized understanding and returned demonstration  HOME EXERCISE PROGRAM: Access Code: [email or web address] URL: [email or web address] Date: 06/29/2022 Prepared by: Maylon Peppers  Exercises - Standing Hip Abduction  - 2-3 x daily - 5-7 x weekly - 3 sets - 10 reps - Standing Hip Extension  - 2-3 x daily - 5-7 x weekly - 3 sets - 10 reps - Standing Hip Flexion AROM  - 2-3 x daily - 5-7 x weekly - 3 sets - 10 reps - Standing Marching  - 2-3 x daily - 5-7 x weekly - 3 sets - 10 reps  ASSESSMENT:  CLINICAL IMPRESSION: Patient is a 77 y.o. female who was seen today for physical therapy evaluation and treatment for R knee pain.   OBJECTIVE IMPAIRMENTS: Abnormal gait, decreased activity tolerance, decreased balance, decreased endurance, decreased mobility, difficulty walking, decreased ROM, decreased strength, impaired flexibility, improper body mechanics, postural dysfunction, and pain.   ACTIVITY LIMITATIONS: standing, squatting, stairs, and locomotion level  PARTICIPATION LIMITATIONS: cleaning, laundry, shopping, community activity, and yard work  PERSONAL FACTORS: Age, Fitness, Past/current experiences, and Time since onset of injury/illness/exacerbation are also affecting patient's functional outcome.   REHAB POTENTIAL: Fair    CLINICAL DECISION MAKING: Stable/uncomplicated  EVALUATION COMPLEXITY: Low   GOALS: Goals reviewed with patient? Yes  SHORT TERM GOALS: Target date: 07/27/2022  Patient will be independent in HEP to improve strength/mobility for better functional independence with ADLs. Baseline: Goal status: INITIAL   LONG TERM GOALS:  Target date: 08/10/2022  Patient will increase FOTO score to equal to or greater than  63 to demonstrate statistically significant improvement in mobility and quality of life.  Baseline: 6/18: 50/100 Goal status: INITIAL  2.  Patient will increase BLE gross strength to 4+/5 as to improve functional strength for independent gait, increased standing tolerance and increased ADL ability. Baseline: 6/18: see above  Goal status: INITIAL  3.  Patient will increase six minute walk test distance to >1300' for progression to community ambulator and improve gait ability Baseline: 6/18: 1085' Goal status: INITIAL  4.  Patient will be able to tolerate >20 minutes of standing to be able to complete household and community activities to show improvement of mobility.  Baseline: 6/18: unable to tolerate 10-15 mins Goal status: INITIAL  PLAN:  PT FREQUENCY: 1-2x/week  PT DURATION: 6 weeks  PLANNED INTERVENTIONS: Therapeutic exercises, Therapeutic activity, Neuromuscular re-education, Balance training, Gait training, Patient/Family education, Self Care, Joint mobilization, Stair training, DME instructions, Spinal mobilization, Cryotherapy, Moist heat, and Manual therapy  PLAN FOR NEXT SESSION: HEP review, hip strengthening    Maylon Peppers, PT, DPT Physical Therapist - [place]  [facility]  06/29/2022, 3:37 PM

## 2022-06-29 ENCOUNTER — Encounter: Payer: Self-pay | Admitting: Physical Therapy

## 2022-06-29 ENCOUNTER — Ambulatory Visit: Payer: Medicare Other | Attending: Internal Medicine

## 2022-06-29 DIAGNOSIS — G8929 Other chronic pain: Secondary | ICD-10-CM | POA: Diagnosis present

## 2022-06-29 DIAGNOSIS — M25561 Pain in right knee: Secondary | ICD-10-CM | POA: Insufficient documentation

## 2022-06-29 DIAGNOSIS — M25662 Stiffness of left knee, not elsewhere classified: Secondary | ICD-10-CM | POA: Insufficient documentation

## 2022-06-29 DIAGNOSIS — R269 Unspecified abnormalities of gait and mobility: Secondary | ICD-10-CM | POA: Diagnosis present

## 2022-06-29 DIAGNOSIS — M25661 Stiffness of right knee, not elsewhere classified: Secondary | ICD-10-CM | POA: Insufficient documentation

## 2022-06-29 DIAGNOSIS — M6281 Muscle weakness (generalized): Secondary | ICD-10-CM | POA: Diagnosis present

## 2022-07-01 ENCOUNTER — Encounter: Payer: Self-pay | Admitting: Physical Therapy

## 2022-07-01 ENCOUNTER — Ambulatory Visit: Payer: Medicare Other

## 2022-07-01 DIAGNOSIS — R269 Unspecified abnormalities of gait and mobility: Secondary | ICD-10-CM

## 2022-07-01 DIAGNOSIS — M25661 Stiffness of right knee, not elsewhere classified: Secondary | ICD-10-CM

## 2022-07-01 DIAGNOSIS — M6281 Muscle weakness (generalized): Secondary | ICD-10-CM

## 2022-07-01 DIAGNOSIS — G8929 Other chronic pain: Secondary | ICD-10-CM

## 2022-07-01 NOTE — Therapy (Signed)
OUTPATIENT PHYSICAL THERAPY LOWER EXTREMITY TREATMENT   Patient Name: Joanne Lewis MRN: [id number] DOB:[birth year], 77 y.o., female Today's Date: 07/01/2022  END OF SESSION:  PT End of Session - 07/01/22 1257     Visit Number 2    Number of Visits 13    Date for PT Re-Evaluation 08/10/22    Authorization Type MEDICARE PART A AND B    Progress Note Due on Visit 10    PT Start Time 1300    PT Stop Time 1347    PT Time Calculation (min) 47 min    Activity Tolerance Patient tolerated treatment well    Behavior During Therapy WFL for tasks assessed/performed              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HCC)    Arthritis    Branch retinal artery occlusion, left eye    Chronic kidney disease    moderate   Fatigue    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 11/25/2017   Hyperkalemia    Hyperlipidemia    Hypertension    Pneumonia    at least 10 years ago   Pre-diabetes    Past Surgical History:  Procedure Laterality Date   COLONOSCOPY WITH PROPOFOL N/A 08/09/2016   Procedure: COLONOSCOPY WITH PROPOFOL;  Surgeon: Scot Jun, MD;  Location: [facility] ENDOSCOPY;  Service: Endoscopy;  Laterality: N/A;   DILATATION & CURETTAGE/HYSTEROSCOPY WITH MYOSURE N/A 02/20/2018   Procedure: DILATATION & CURETTAGE/HYSTEROSCOPY WITH MYOSURE;  Surgeon: Schermerhorn, Ihor Austin, MD;  Location: [facility] ORS;  Service: Gynecology;  Laterality: N/A;   DILATION AND CURETTAGE OF UTERUS N/A 02/20/2018   Procedure: DILATATION AND CURETTAGE, fractional, resection of endometrial mass, possible myosure, hysteroscopy;  Surgeon: Schermerhorn, Ihor Austin, MD;  Location: [facility] ORS;  Service: Gynecology;  Laterality: N/A;   FEMORAL HERNIA REPAIR Right 11/26/2017   Procedure: HERNIA REPAIR FEMORAL with insertion or mesh;  Surgeon: Ancil Linsey, MD;  Location: [facility] ORS;  Service: General;  Laterality: Right;   HYSTEROSCOPY WITH D & C N/A 02/20/2018    Procedure: DILATATION AND CURETTAGE /HYSTEROSCOPY,;  Surgeon: Schermerhorn, Ihor Austin, MD;  Location: [facility] ORS;  Service: Gynecology;  Laterality: N/A;   TOTAL KNEE ARTHROPLASTY Left 08/11/2020   Procedure: TOTAL KNEE ARTHROPLASTY;  Surgeon: Ollen Gross, MD;  Location: [facility] ORS;  Service: Orthopedics;  Laterality: Left;   Patient Active Problem List   Diagnosis Date Noted   Osteoarthritis of knee 08/13/2020   OA (osteoarthritis) of knee 08/11/2020   Primary osteoarthritis of left knee 08/11/2020   SOB (shortness of breath) 10/10/2017   Coronary artery calcification 09/19/2016   Chest pain 02/03/2016   Elevated troponin 02/03/2016   Anxiety 02/03/2016   Essential hypertension 02/03/2016   Mixed hyperlipidemia 02/03/2016   Chest tightness 01/01/2016    PCP: Einar Crow, MD  REFERRING PROVIDER: Einar Crow, MD  REFERRING DIAG: [phone] (ICD-10-CM) - Osteoarthrosis, unspecified whether generalized or localized, lower leg M17.11 (ICD-10-CM) - Unilateral primary osteoarthritis, right knee   THERAPY DIAG:  Muscle weakness (generalized)  Gait difficulty  Knee joint stiffness, bilateral  Chronic pain of right knee  Rationale for Evaluation and Treatment: Rehabilitation  ONSET DATE: "years and years"   SUBJECTIVE:   SUBJECTIVE STATEMENT: Patient reports difficulty with standing for long periods of time and hurts a lot more with squatting to garden or pick up objects. Feels that it is grinding when she is bending over. Unable to stand >  10-15 minutes due to R knee pain. After L TKA, patient has more mobility but feels that she is overcompensating on L for the R knee pain. Patient reports tearing a muscle in her back late April/early May 2024 after pulling up a strong weed in her yard.   PERTINENT HISTORY: Planned R TKA on 08/16/22 by Dr. Trudee Grip. S/p L TKA on 08/11/2020 PAIN:  Are you having pain? Yes: NPRS scale: 4/10 Pain location: R knee pain  Pain description:  dull ache Aggravating factors: standing, squatting Relieving factors: sitting   PRECAUTIONS: Fall  WEIGHT BEARING RESTRICTIONS: No  FALLS:  Has patient fallen in last 6 months? No  LIVING ENVIRONMENT: Lives with: lives alone Lives in: House/apartment Stairs: Yes: Internal: 3 steps; none Has following equipment at home: Single point cane and Sahil Milner - 2 wheeled  OCCUPATION: retired   PLOF: Independent  PATIENT GOALS: be more mobile, walk with less pain, stand longer and have more stamina   NEXT MD VISIT: 07/2022  OBJECTIVE:   DIAGNOSTIC FINDINGS: N/A  PATIENT SURVEYS:  FOTO 50  COGNITION: Overall cognitive status: Within functional limits for tasks assessed     SENSATION: WFL  POSTURE: rounded shoulders and forward head  PALPATION: No TTP  LOWER EXTREMITY ROM:  Active ROM Right eval Left eval  Hip flexion    Hip extension    Hip abduction    Hip adduction    Hip internal rotation    Hip external rotation    Knee flexion    Knee extension    Ankle dorsiflexion    Ankle plantarflexion    Ankle inversion    Ankle eversion     (Blank rows = not tested)  LOWER EXTREMITY MMT:  MMT Right eval Left eval  Hip flexion 4 4+  Hip extension    Hip abduction 4 4+  Hip adduction    Hip internal rotation    Hip external rotation    Knee flexion 4+ 4+  Knee extension 4+ 4+  Ankle dorsiflexion    Ankle plantarflexion    Ankle inversion    Ankle eversion     (Blank rows = not tested)  FUNCTIONAL TESTS:  5 times sit to stand: 12.5 seconds 6 minute walk test: 1,085'   GAIT: Distance walked: 19' Assistive device utilized: None Level of assistance: Modified independence Comments: R lateral lean, decreased stance time on R, decreased DF on R   TODAY'S TREATMENT:                                                                                                                              DATE: 07/01/22  Subjective: Patient reports mild discomfort in R  leg but otherwise feeling well and has been completing HEP as instructed.   Treatment:  Nustep level 4 (seat 10, arms 9) x 8 minutes to improve flexibility and endurance   Standing with 2# AW:  Marching 2 x 15 each LE  Hip abduction 2 x 15 each LE  Hip extension 2 x 15 each LE  Heel raises 2 x 20 each LE   Lateral/forward/backward walking in // bars x 5 laps with GTB  Sit to stand 2 x 12 with 8# DB   Sitting with GTB: Hip abduction 2 x 15 each LE  Hip flexion 2 x 15 each LE    PATIENT EDUCATION:  Education details: HEP, POC, goals Person educated: Patient Education method: Explanation, Demonstration, and Handouts Education comprehension: verbalized understanding and returned demonstration  HOME EXERCISE PROGRAM: Access Code: [email or web address] URL: [email or web address] Date: 06/29/2022 Prepared by: Maylon Peppers  Exercises - Standing Hip Abduction  - 2-3 x daily - 5-7 x weekly - 3 sets - 10 reps - Standing Hip Extension  - 2-3 x daily - 5-7 x weekly - 3 sets - 10 reps - Standing Hip Flexion AROM  - 2-3 x daily - 5-7 x weekly - 3 sets - 10 reps - Standing Marching  - 2-3 x daily - 5-7 x weekly - 3 sets - 10 reps  ASSESSMENT:  CLINICAL IMPRESSION:  Patient arrives to treatment session motivated to participate. Session focused on LE strengthening with resistance. Patient tolerated session well with increase in resistance. Continues to demonstrate R hip abductor weakness with single leg stance activities. Patient will continue to benefit from skilled therapy to address remaining deficits in order to improve quality of life and return to PLOF.     OBJECTIVE IMPAIRMENTS: Abnormal gait, decreased activity tolerance, decreased balance, decreased endurance, decreased mobility, difficulty walking, decreased ROM, decreased strength, impaired flexibility, improper body mechanics, postural dysfunction, and pain.   ACTIVITY LIMITATIONS: standing, squatting, stairs, and locomotion  level  PARTICIPATION LIMITATIONS: cleaning, laundry, shopping, community activity, and yard work  PERSONAL FACTORS: Age, Fitness, Past/current experiences, and Time since onset of injury/illness/exacerbation are also affecting patient's functional outcome.   REHAB POTENTIAL: Fair    CLINICAL DECISION MAKING: Stable/uncomplicated  EVALUATION COMPLEXITY: Low   GOALS: Goals reviewed with patient? Yes  SHORT TERM GOALS: Target date: 07/27/2022  Patient will be independent in HEP to improve strength/mobility for better functional independence with ADLs. Baseline: Goal status: INITIAL   LONG TERM GOALS: Target date: 08/10/2022  Patient will increase FOTO score to equal to or greater than  63 to demonstrate statistically significant improvement in mobility and quality of life.  Baseline: 6/18: 50/100 Goal status: INITIAL  2.  Patient will increase BLE gross strength to 4+/5 as to improve functional strength for independent gait, increased standing tolerance and increased ADL ability. Baseline: 6/18: see above  Goal status: INITIAL  3.  Patient will increase six minute walk test distance to >1300' for progression to community ambulator and improve gait ability Baseline: 6/18: 1085' Goal status: INITIAL  4.  Patient will be able to tolerate >20 minutes of standing to be able to complete household and community activities to show improvement of mobility.  Baseline: 6/18: unable to tolerate 10-15 mins Goal status: INITIAL  PLAN:  PT FREQUENCY: 1-2x/week  PT DURATION: 6 weeks  PLANNED INTERVENTIONS: Therapeutic exercises, Therapeutic activity, Neuromuscular re-education, Balance training, Gait training, Patient/Family education, Self Care, Joint mobilization, Stair training, DME instructions, Spinal mobilization, Cryotherapy, Moist heat, and Manual therapy  PLAN FOR NEXT SESSION: HEP review, hip strengthening    Maylon Peppers, PT, DPT Physical Therapist - [place]  [facility]  07/01/2022, 1:47 PM

## 2022-07-06 ENCOUNTER — Encounter: Payer: Self-pay | Admitting: Physical Therapy

## 2022-07-06 ENCOUNTER — Ambulatory Visit: Payer: Medicare Other

## 2022-07-06 DIAGNOSIS — R269 Unspecified abnormalities of gait and mobility: Secondary | ICD-10-CM

## 2022-07-06 DIAGNOSIS — M25661 Stiffness of right knee, not elsewhere classified: Secondary | ICD-10-CM | POA: Diagnosis not present

## 2022-07-06 DIAGNOSIS — M6281 Muscle weakness (generalized): Secondary | ICD-10-CM

## 2022-07-06 DIAGNOSIS — G8929 Other chronic pain: Secondary | ICD-10-CM

## 2022-07-06 NOTE — Therapy (Signed)
OUTPATIENT PHYSICAL THERAPY LOWER EXTREMITY TREATMENT   Patient Name: Joanne Lewis MRN: [id number] DOB:1945/03/30, 77 y.o., female Today's Date: 07/06/2022  END OF SESSION:  PT End of Session - 07/06/22 1514     Visit Number 3    Number of Visits 13    Date for PT Re-Evaluation 08/10/22    Authorization Type MEDICARE PART A AND B    Progress Note Due on Visit 10    PT Start Time 1515    PT Stop Time 1558    PT Time Calculation (min) 43 min    Activity Tolerance Patient tolerated treatment well    Behavior During Therapy WFL for tasks assessed/performed               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HCC)    Arthritis    Branch retinal artery occlusion, left eye    Chronic kidney disease    moderate   Fatigue    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 11/25/2017   Hyperkalemia    Hyperlipidemia    Hypertension    Pneumonia    at least 10 years ago   Pre-diabetes    Past Surgical History:  Procedure Laterality Date   COLONOSCOPY WITH PROPOFOL N/A 08/09/2016   Procedure: COLONOSCOPY WITH PROPOFOL;  Surgeon: Scot Jun, MD;  Location: [facility] ENDOSCOPY;  Service: Endoscopy;  Laterality: N/A;   DILATATION & CURETTAGE/HYSTEROSCOPY WITH MYOSURE N/A 02/20/2018   Procedure: DILATATION & CURETTAGE/HYSTEROSCOPY WITH MYOSURE;  Surgeon: Schermerhorn, Ihor Austin, MD;  Location: [facility] ORS;  Service: Gynecology;  Laterality: N/A;   DILATION AND CURETTAGE OF UTERUS N/A 02/20/2018   Procedure: DILATATION AND CURETTAGE, fractional, resection of endometrial mass, possible myosure, hysteroscopy;  Surgeon: Schermerhorn, Ihor Austin, MD;  Location: [facility] ORS;  Service: Gynecology;  Laterality: N/A;   FEMORAL HERNIA REPAIR Right 11/26/2017   Procedure: HERNIA REPAIR FEMORAL with insertion or mesh;  Surgeon: Ancil Linsey, MD;  Location: [facility] ORS;  Service: General;  Laterality: Right;   HYSTEROSCOPY WITH D & C N/A 02/20/2018    Procedure: DILATATION AND CURETTAGE /HYSTEROSCOPY,;  Surgeon: Schermerhorn, Ihor Austin, MD;  Location: [facility] ORS;  Service: Gynecology;  Laterality: N/A;   TOTAL KNEE ARTHROPLASTY Left 08/11/2020   Procedure: TOTAL KNEE ARTHROPLASTY;  Surgeon: Ollen Gross, MD;  Location: [facility] ORS;  Service: Orthopedics;  Laterality: Left;   Patient Active Problem List   Diagnosis Date Noted   Osteoarthritis of knee 08/13/2020   OA (osteoarthritis) of knee 08/11/2020   Primary osteoarthritis of left knee 08/11/2020   SOB (shortness of breath) 10/10/2017   Coronary artery calcification 09/19/2016   Chest pain 02/03/2016   Elevated troponin 02/03/2016   Anxiety 02/03/2016   Essential hypertension 02/03/2016   Mixed hyperlipidemia 02/03/2016   Chest tightness 01/01/2016    PCP: Einar Crow, MD  REFERRING PROVIDER: Einar Crow, MD  REFERRING DIAG: [phone] (ICD-10-CM) - Osteoarthrosis, unspecified whether generalized or localized, lower leg M17.11 (ICD-10-CM) - Unilateral primary osteoarthritis, right knee   THERAPY DIAG:  Muscle weakness (generalized)  Gait difficulty  Chronic pain of right knee  Rationale for Evaluation and Treatment: Rehabilitation  ONSET DATE: "years and years"   SUBJECTIVE:   SUBJECTIVE STATEMENT: Patient reports difficulty with standing for long periods of time and hurts a lot more with squatting to garden or pick up objects. Feels that it is grinding when she is bending over. Unable to stand >10-15 minutes due to  R knee pain. After L TKA, patient has more mobility but feels that she is overcompensating on L for the R knee pain. Patient reports tearing a muscle in her back late April/early May 2024 after pulling up a strong weed in her yard.   PERTINENT HISTORY: Planned R TKA on 08/16/22 by Dr. Trudee Grip. S/p L TKA on 08/11/2020 PAIN:  Are you having pain? Yes: NPRS scale: 4/10 Pain location: R knee pain  Pain description: dull ache Aggravating factors:  standing, squatting Relieving factors: sitting   PRECAUTIONS: Fall  WEIGHT BEARING RESTRICTIONS: No  FALLS:  Has patient fallen in last 6 months? No  LIVING ENVIRONMENT: Lives with: lives alone Lives in: House/apartment Stairs: Yes: Internal: 3 steps; none Has following equipment at home: Single point cane and Joanne Lewis - 2 wheeled  OCCUPATION: retired   PLOF: Independent  PATIENT GOALS: be more mobile, walk with less pain, stand longer and have more stamina   NEXT MD VISIT: 07/2022  OBJECTIVE:   DIAGNOSTIC FINDINGS: N/A  PATIENT SURVEYS:  FOTO 50  COGNITION: Overall cognitive status: Within functional limits for tasks assessed     SENSATION: WFL  POSTURE: rounded shoulders and forward head  PALPATION: No TTP  LOWER EXTREMITY ROM:  Active ROM Right eval Left eval  Hip flexion    Hip extension    Hip abduction    Hip adduction    Hip internal rotation    Hip external rotation    Knee flexion    Knee extension    Ankle dorsiflexion    Ankle plantarflexion    Ankle inversion    Ankle eversion     (Blank rows = not tested)  LOWER EXTREMITY MMT:  MMT Right eval Left eval  Hip flexion 4 4+  Hip extension    Hip abduction 4 4+  Hip adduction    Hip internal rotation    Hip external rotation    Knee flexion 4+ 4+  Knee extension 4+ 4+  Ankle dorsiflexion    Ankle plantarflexion    Ankle inversion    Ankle eversion     (Blank rows = not tested)  FUNCTIONAL TESTS:  5 times sit to stand: 12.5 seconds 6 minute walk test: 1,085'   GAIT: Distance walked: 37' Assistive device utilized: None Level of assistance: Modified independence Comments: R lateral lean, decreased stance time on R, decreased DF on R   TODAY'S TREATMENT:                                                                                                                              DATE: 07/06/22  Subjective: Patient reports feeling soreness this PM. Reports she forgot to do her  exercises and doubled up her exercises and felt increased back and R hip pain afterwards.    Treatment:  Nustep level 4 (seat 10, arms 9) x 8 minutes to improve flexibility and endurance   Sit to stand 2 x  12 with 8# DB   Standing with 3# AW:  Marching 2 x 15 each LE  Hip abduction 2 x 15 each LE  Hip extension 2 x 15 each LE  Hamstring curls 2  15 each LE  Heel raises 2 x 20 each LE   Step taps 6" step 2 x 15 alternating LE    Fwd/lateral step ups 6" step 2 x 15 each LE/direction   PATIENT EDUCATION:  Education details: HEP, POC, goals Person educated: Patient Education method: Explanation, Demonstration, and Handouts Education comprehension: verbalized understanding and returned demonstration  HOME EXERCISE PROGRAM: Access Code: [email or web address] URL: [email or web address] Date: 06/29/2022 Prepared by: Maylon Peppers  Exercises - Standing Hip Abduction  - 2-3 x daily - 5-7 x weekly - 3 sets - 10 reps - Standing Hip Extension  - 2-3 x daily - 5-7 x weekly - 3 sets - 10 reps - Standing Hip Flexion AROM  - 2-3 x daily - 5-7 x weekly - 3 sets - 10 reps - Standing Marching  - 2-3 x daily - 5-7 x weekly - 3 sets - 10 reps  Access Code: [email or web address] URL: [email or web address] Date: 07/06/2022 Prepared by: Maylon Peppers  Exercises - Standing Hip Flexion with Resistance Loop  - 2-3 x daily - 5-7 x weekly - 3 sets - 10 reps - Hip Abduction with Resistance Loop  - 2-3 x daily - 5-7 x weekly - 3 sets - 10 reps - Hip Extension with Resistance Loop  - 2-3 x daily - 5-7 x weekly - 3 sets - 10 reps  ASSESSMENT:  CLINICAL IMPRESSION:   Patient continues to be motivated to improve towards physical therapy goals. Session focused LE strengthening with increasing resistance. Tolerated session well with no increase in pain/discomfort. Patient will continue to benefit from skilled therapy to address remaining deficits in order to improve quality of life and LE strength in  preparation for R TKA in August.    OBJECTIVE IMPAIRMENTS: Abnormal gait, decreased activity tolerance, decreased balance, decreased endurance, decreased mobility, difficulty walking, decreased ROM, decreased strength, impaired flexibility, improper body mechanics, postural dysfunction, and pain.   ACTIVITY LIMITATIONS: standing, squatting, stairs, and locomotion level  PARTICIPATION LIMITATIONS: cleaning, laundry, shopping, community activity, and yard work  PERSONAL FACTORS: Age, Fitness, Past/current experiences, and Time since onset of injury/illness/exacerbation are also affecting patient's functional outcome.   REHAB POTENTIAL: Fair    CLINICAL DECISION MAKING: Stable/uncomplicated  EVALUATION COMPLEXITY: Low   GOALS: Goals reviewed with patient? Yes  SHORT TERM GOALS: Target date: 07/27/2022  Patient will be independent in HEP to improve strength/mobility for better functional independence with ADLs. Baseline: Goal status: INITIAL   LONG TERM GOALS: Target date: 08/10/2022  Patient will increase FOTO score to equal to or greater than  63 to demonstrate statistically significant improvement in mobility and quality of life.  Baseline: 6/18: 50/100 Goal status: INITIAL  2.  Patient will increase BLE gross strength to 4+/5 as to improve functional strength for independent gait, increased standing tolerance and increased ADL ability. Baseline: 6/18: see above  Goal status: INITIAL  3.  Patient will increase six minute walk test distance to >1300' for progression to community ambulator and improve gait ability Baseline: 6/18: 1085' Goal status: INITIAL  4.  Patient will be able to tolerate >20 minutes of standing to be able to complete household and community activities to show improvement of mobility.  Baseline: 6/18: unable to tolerate 10-15 mins Goal status: INITIAL  PLAN:  PT FREQUENCY:  1-2x/week  PT DURATION: 6 weeks  PLANNED INTERVENTIONS: Therapeutic  exercises, Therapeutic activity, Neuromuscular re-education, Balance training, Gait training, Patient/Family education, Self Care, Joint mobilization, Stair training, DME instructions, Spinal mobilization, Cryotherapy, Moist heat, and Manual therapy  PLAN FOR NEXT SESSION: hip strengthening   Maylon Peppers, PT, DPT Physical Therapist - [facility]  [facility]  07/06/2022, 3:14 PM

## 2022-07-08 ENCOUNTER — Ambulatory Visit
Admission: RE | Admit: 2022-07-08 | Discharge: 2022-07-08 | Disposition: A | Discharge status: Home / Self Care | Payer: Medicare Other | Source: Ambulatory Visit | Attending: Internal Medicine | Admitting: Internal Medicine

## 2022-07-08 ENCOUNTER — Ambulatory Visit: Payer: Medicare Other

## 2022-07-08 ENCOUNTER — Encounter: Payer: Self-pay | Admitting: Physical Therapy

## 2022-07-08 DIAGNOSIS — R269 Unspecified abnormalities of gait and mobility: Secondary | ICD-10-CM

## 2022-07-08 DIAGNOSIS — Z1231 Encounter for screening mammogram for malignant neoplasm of breast: Secondary | ICD-10-CM | POA: Insufficient documentation

## 2022-07-08 DIAGNOSIS — M25661 Stiffness of right knee, not elsewhere classified: Secondary | ICD-10-CM | POA: Diagnosis not present

## 2022-07-08 DIAGNOSIS — G8929 Other chronic pain: Secondary | ICD-10-CM

## 2022-07-08 DIAGNOSIS — M6281 Muscle weakness (generalized): Secondary | ICD-10-CM

## 2022-07-08 NOTE — Therapy (Signed)
OUTPATIENT PHYSICAL THERAPY LOWER EXTREMITY TREATMENT   Patient Name: Joanne Lewis MRN: [id number] DOB:June 16, 1945, 77 y.o., female Today's Date: 07/08/2022  END OF SESSION:  PT End of Session - 07/08/22 1300     Visit Number 4    Number of Visits 13    Date for PT Re-Evaluation 08/10/22    Authorization Type MEDICARE PART A AND B    Progress Note Due on Visit 10    PT Start Time 1300    PT Stop Time 1344    PT Time Calculation (min) 44 min    Activity Tolerance Patient tolerated treatment well    Behavior During Therapy WFL for tasks assessed/performed                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HCC)    Arthritis    Branch retinal artery occlusion, left eye    Chronic kidney disease    moderate   Fatigue    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 11/25/2017   Hyperkalemia    Hyperlipidemia    Hypertension    Pneumonia    at least 10 years ago   Pre-diabetes    Past Surgical History:  Procedure Laterality Date   COLONOSCOPY WITH PROPOFOL N/A 08/09/2016   Procedure: COLONOSCOPY WITH PROPOFOL;  Surgeon: Scot Jun, MD;  Location: [facility] ENDOSCOPY;  Service: Endoscopy;  Laterality: N/A;   DILATATION & CURETTAGE/HYSTEROSCOPY WITH MYOSURE N/A 02/20/2018   Procedure: DILATATION & CURETTAGE/HYSTEROSCOPY WITH MYOSURE;  Surgeon: Schermerhorn, Ihor Austin, MD;  Location: [facility] ORS;  Service: Gynecology;  Laterality: N/A;   DILATION AND CURETTAGE OF UTERUS N/A 02/20/2018   Procedure: DILATATION AND CURETTAGE, fractional, resection of endometrial mass, possible myosure, hysteroscopy;  Surgeon: Schermerhorn, Ihor Austin, MD;  Location: [facility] ORS;  Service: Gynecology;  Laterality: N/A;   FEMORAL HERNIA REPAIR Right 11/26/2017   Procedure: HERNIA REPAIR FEMORAL with insertion or mesh;  Surgeon: Ancil Linsey, MD;  Location: [facility] ORS;  Service: General;  Laterality: Right;   HYSTEROSCOPY WITH D & C N/A 02/20/2018    Procedure: DILATATION AND CURETTAGE /HYSTEROSCOPY,;  Surgeon: Schermerhorn, Ihor Austin, MD;  Location: [facility] ORS;  Service: Gynecology;  Laterality: N/A;   TOTAL KNEE ARTHROPLASTY Left 08/11/2020   Procedure: TOTAL KNEE ARTHROPLASTY;  Surgeon: Ollen Gross, MD;  Location: [facility] ORS;  Service: Orthopedics;  Laterality: Left;   Patient Active Problem List   Diagnosis Date Noted   Osteoarthritis of knee 08/13/2020   OA (osteoarthritis) of knee 08/11/2020   Primary osteoarthritis of left knee 08/11/2020   SOB (shortness of breath) 10/10/2017   Coronary artery calcification 09/19/2016   Chest pain 02/03/2016   Elevated troponin 02/03/2016   Anxiety 02/03/2016   Essential hypertension 02/03/2016   Mixed hyperlipidemia 02/03/2016   Chest tightness 01/01/2016    PCP: Einar Crow, MD  REFERRING PROVIDER: Einar Crow, MD  REFERRING DIAG: [phone] (ICD-10-CM) - Osteoarthrosis, unspecified whether generalized or localized, lower leg M17.11 (ICD-10-CM) - Unilateral primary osteoarthritis, right knee   THERAPY DIAG:  Muscle weakness (generalized)  Gait difficulty  Chronic pain of right knee  Knee joint stiffness, bilateral  Rationale for Evaluation and Treatment: Rehabilitation  ONSET DATE: "years and years"   SUBJECTIVE:   SUBJECTIVE STATEMENT: Patient reports difficulty with standing for long periods of time and hurts a lot more with squatting to garden or pick up objects. Feels that it is grinding when she is bending over. Unable  to stand >10-15 minutes due to R knee pain. After L TKA, patient has more mobility but feels that she is overcompensating on L for the R knee pain. Patient reports tearing a muscle in her back late April/early May 2024 after pulling up a strong weed in her yard.   PERTINENT HISTORY: Planned R TKA on 08/16/22 by Dr. Trudee Grip. S/p L TKA on 08/11/2020 PAIN:  Are you having pain? Yes: NPRS scale: 4/10 Pain location: R knee pain  Pain description:  dull ache Aggravating factors: standing, squatting Relieving factors: sitting   PRECAUTIONS: Fall  WEIGHT BEARING RESTRICTIONS: No  FALLS:  Has patient fallen in last 6 months? No  LIVING ENVIRONMENT: Lives with: lives alone Lives in: House/apartment Stairs: Yes: Internal: 3 steps; none Has following equipment at home: Single point cane and [employer] Glaze - 2 wheeled  OCCUPATION: retired   PLOF: Independent  PATIENT GOALS: be more mobile, walk with less pain, stand longer and have more stamina   NEXT MD VISIT: 07/2022  OBJECTIVE:   DIAGNOSTIC FINDINGS: N/A  PATIENT SURVEYS:  FOTO 50  COGNITION: Overall cognitive status: Within functional limits for tasks assessed     SENSATION: WFL  POSTURE: rounded shoulders and forward head  PALPATION: No TTP  LOWER EXTREMITY ROM:  Active ROM Right eval Left eval  Hip flexion    Hip extension    Hip abduction    Hip adduction    Hip internal rotation    Hip external rotation    Knee flexion    Knee extension    Ankle dorsiflexion    Ankle plantarflexion    Ankle inversion    Ankle eversion     (Blank rows = not tested)  LOWER EXTREMITY MMT:  MMT Right eval Left eval  Hip flexion 4 4+  Hip extension    Hip abduction 4 4+  Hip adduction    Hip internal rotation    Hip external rotation    Knee flexion 4+ 4+  Knee extension 4+ 4+  Ankle dorsiflexion    Ankle plantarflexion    Ankle inversion    Ankle eversion     (Blank rows = not tested)  FUNCTIONAL TESTS:  5 times sit to stand: 12.5 seconds 6 minute walk test: 1,085'   GAIT: Distance walked: 67' Assistive device utilized: None Level of assistance: Modified independence Comments: R lateral lean, decreased stance time on R, decreased DF on R   TODAY'S TREATMENT:                                                                                                                              DATE: 07/08/22  Subjective: Patient feeling light soreness but  wanting to improve quickly for her upcoming TKA  Treatment:  Nustep level 4 (seat 10, arms 9) x 8 minutes to improve flexibility and endurance   TRX mini squats 2 x 10 with B UE support TRX mini lunge 2 x  10 each LE with B UE support   Side steps at agility ladder with GTB x 4 laps   Step taps 6" step 2 x 20 alternating LE, no UE support   Fwd 6" step 2 x 20 each LE/direction with B UE resting on rail   Standing heel/toe raises 2 x 10 with B UE resting on rail   PATIENT EDUCATION:  Education details: HEP, POC, goals Person educated: Patient Education method: Explanation, Demonstration, and Handouts Education comprehension: verbalized understanding and returned demonstration  HOME EXERCISE PROGRAM: Access Code: [email or web address] URL: [email or web address] Date: 06/29/2022 Prepared by: Maylon Peppers  Exercises - Standing Hip Abduction  - 2-3 x daily - 5-7 x weekly - 3 sets - 10 reps - Standing Hip Extension  - 2-3 x daily - 5-7 x weekly - 3 sets - 10 reps - Standing Hip Flexion AROM  - 2-3 x daily - 5-7 x weekly - 3 sets - 10 reps - Standing Marching  - 2-3 x daily - 5-7 x weekly - 3 sets - 10 reps  Access Code: [email or web address] URL: [email or web address] Date: 07/06/2022 Prepared by: Maylon Peppers  Exercises - Standing Hip Flexion with Resistance Loop  - 2-3 x daily - 5-7 x weekly - 3 sets - 10 reps - Hip Abduction with Resistance Loop  - 2-3 x daily - 5-7 x weekly - 3 sets - 10 reps - Hip Extension with Resistance Loop  - 2-3 x daily - 5-7 x weekly - 3 sets - 10 reps  ASSESSMENT:  CLINICAL IMPRESSION:    Patient continues to remain motivated to participate in order to improve strength in preparation for surgery. Session focused on LE strengthening with difficulty with single leg activities on R. Patient will continue to benefit from skilled therapy to address remaining deficits in order to improve quality of life and LE strength in preparation for R TKA in  August.   OBJECTIVE IMPAIRMENTS: Abnormal gait, decreased activity tolerance, decreased balance, decreased endurance, decreased mobility, difficulty walking, decreased ROM, decreased strength, impaired flexibility, improper body mechanics, postural dysfunction, and pain.   ACTIVITY LIMITATIONS: standing, squatting, stairs, and locomotion level  PARTICIPATION LIMITATIONS: cleaning, laundry, shopping, community activity, and yard work  PERSONAL FACTORS: Age, Fitness, Past/current experiences, and Time since onset of injury/illness/exacerbation are also affecting patient's functional outcome.   REHAB POTENTIAL: Fair    CLINICAL DECISION MAKING: Stable/uncomplicated  EVALUATION COMPLEXITY: Low   GOALS: Goals reviewed with patient? Yes  SHORT TERM GOALS: Target date: 07/27/2022  Patient will be independent in HEP to improve strength/mobility for better functional independence with ADLs. Baseline: Goal status: INITIAL   LONG TERM GOALS: Target date: 08/10/2022  Patient will increase FOTO score to equal to or greater than  63 to demonstrate statistically significant improvement in mobility and quality of life.  Baseline: 6/18: 50/100 Goal status: INITIAL  2.  Patient will increase BLE gross strength to 4+/5 as to improve functional strength for independent gait, increased standing tolerance and increased ADL ability. Baseline: 6/18: see above  Goal status: INITIAL  3.  Patient will increase six minute walk test distance to >1300' for progression to community ambulator and improve gait ability Baseline: 6/18: 1085' Goal status: INITIAL  4.  Patient will be able to tolerate >20 minutes of standing to be able to complete household and community activities to show improvement of mobility.  Baseline: 6/18: unable to tolerate 10-15 mins Goal status: INITIAL  PLAN:  PT FREQUENCY: 1-2x/week  PT DURATION: 6 weeks  PLANNED INTERVENTIONS: Therapeutic exercises, Therapeutic activity,  Neuromuscular re-education, Balance training, Gait training, Patient/Family education, Self Care, Joint mobilization, Stair training, DME instructions, Spinal mobilization, Cryotherapy, Moist heat, and Manual therapy  PLAN FOR NEXT SESSION: hip strengthening   Maylon Peppers, PT, DPT Physical Therapist - [facility]  [facility]  07/08/2022, 1:46 PM

## 2022-07-12 ENCOUNTER — Ambulatory Visit: Payer: Medicare Other | Attending: Internal Medicine

## 2022-07-12 ENCOUNTER — Encounter: Payer: Self-pay | Admitting: Physical Therapy

## 2022-07-12 ENCOUNTER — Ambulatory Visit: Payer: Medicare Other | Admitting: Physical Therapy

## 2022-07-12 DIAGNOSIS — M25662 Stiffness of left knee, not elsewhere classified: Secondary | ICD-10-CM | POA: Diagnosis present

## 2022-07-12 DIAGNOSIS — M25561 Pain in right knee: Secondary | ICD-10-CM | POA: Diagnosis present

## 2022-07-12 DIAGNOSIS — M25661 Stiffness of right knee, not elsewhere classified: Secondary | ICD-10-CM | POA: Diagnosis present

## 2022-07-12 DIAGNOSIS — M6281 Muscle weakness (generalized): Secondary | ICD-10-CM | POA: Diagnosis not present

## 2022-07-12 DIAGNOSIS — G8929 Other chronic pain: Secondary | ICD-10-CM | POA: Diagnosis present

## 2022-07-12 DIAGNOSIS — R269 Unspecified abnormalities of gait and mobility: Secondary | ICD-10-CM | POA: Insufficient documentation

## 2022-07-12 NOTE — Therapy (Signed)
OUTPATIENT PHYSICAL THERAPY LOWER EXTREMITY TREATMENT   Patient Name: Joanne Lewis MRN: [id number] DOB:[birth year], 77 y.o., female Today's Date: 07/12/2022  END OF SESSION:  PT End of Session - 07/12/22 1421     Visit Number 5    Number of Visits 13    Date for PT Re-Evaluation 08/10/22    Authorization Type MEDICARE PART A AND B    Progress Note Due on Visit 10    PT Start Time 1430    PT Stop Time 1514    PT Time Calculation (min) 44 min    Activity Tolerance Patient tolerated treatment well    Behavior During Therapy WFL for tasks assessed/performed                 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HCC)    Arthritis    Branch retinal artery occlusion, left eye    Chronic kidney disease    moderate   Fatigue    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 11/25/2017   Hyperkalemia    Hyperlipidemia    Hypertension    Pneumonia    at least 10 years ago   Pre-diabetes    Past Surgical History:  Procedure Laterality Date   COLONOSCOPY WITH PROPOFOL N/A 08/09/2016   Procedure: COLONOSCOPY WITH PROPOFOL;  Surgeon: Joanne Jun, MD;  Location: [facility] ENDOSCOPY;  Service: Endoscopy;  Laterality: N/A;   DILATATION & CURETTAGE/HYSTEROSCOPY WITH MYOSURE N/A 02/20/2018   Procedure: DILATATION & CURETTAGE/HYSTEROSCOPY WITH MYOSURE;  Surgeon: Schermerhorn, Ihor Austin, MD;  Location: [facility] ORS;  Service: Gynecology;  Laterality: N/A;   DILATION AND CURETTAGE OF UTERUS N/A 02/20/2018   Procedure: DILATATION AND CURETTAGE, fractional, resection of endometrial mass, possible myosure, hysteroscopy;  Surgeon: Schermerhorn, Ihor Austin, MD;  Location: [facility] ORS;  Service: Gynecology;  Laterality: N/A;   FEMORAL HERNIA REPAIR Right 11/26/2017   Procedure: HERNIA REPAIR FEMORAL with insertion or mesh;  Surgeon: Joanne Linsey, MD;  Location: [facility] ORS;  Service: General;  Laterality: Right;   HYSTEROSCOPY WITH D & C N/A  02/20/2018   Procedure: DILATATION AND CURETTAGE /HYSTEROSCOPY,;  Surgeon: Schermerhorn, Ihor Austin, MD;  Location: [facility] ORS;  Service: Gynecology;  Laterality: N/A;   TOTAL KNEE ARTHROPLASTY Left 08/11/2020   Procedure: TOTAL KNEE ARTHROPLASTY;  Surgeon: Joanne Gross, MD;  Location: [facility] ORS;  Service: Orthopedics;  Laterality: Left;   Patient Active Problem List   Diagnosis Date Noted   Osteoarthritis of knee 08/13/2020   OA (osteoarthritis) of knee 08/11/2020   Primary osteoarthritis of left knee 08/11/2020   SOB (shortness of breath) 10/10/2017   Coronary artery calcification 09/19/2016   Chest pain 02/03/2016   Elevated troponin 02/03/2016   Anxiety 02/03/2016   Essential hypertension 02/03/2016   Mixed hyperlipidemia 02/03/2016   Chest tightness 01/01/2016    PCP: Joanne Crow, MD  REFERRING PROVIDER: Einar Crow, MD  REFERRING DIAG: [phone] (ICD-10-CM) - Osteoarthrosis, unspecified whether generalized or localized, lower leg M17.11 (ICD-10-CM) - Unilateral primary osteoarthritis, right knee   THERAPY DIAG:  Muscle weakness (generalized)  Gait difficulty  Chronic pain of right knee  Knee joint stiffness, bilateral  Rationale for Evaluation and Treatment: Rehabilitation  ONSET DATE: "years and years"   SUBJECTIVE:   SUBJECTIVE STATEMENT: Patient reports difficulty with standing for long periods of time and hurts a lot more with squatting to garden or pick up objects. Feels that it is grinding when she is bending over.  Unable to stand >10-15 minutes due to R knee pain. After L TKA, patient has more mobility but feels that she is overcompensating on L for the R knee pain. Patient reports tearing a muscle in her back late April/early May 2024 after pulling up a strong weed in her yard.   PERTINENT HISTORY: Planned R TKA on 08/16/22 by Dr. Trudee Lewis. S/p L TKA on 08/11/2020 PAIN:  Are you having pain? Yes: NPRS scale: 4/10 Pain location: R knee pain  Pain  description: dull ache Aggravating factors: standing, squatting Relieving factors: sitting   PRECAUTIONS: Fall  WEIGHT BEARING RESTRICTIONS: No  FALLS:  Has patient fallen in last 6 months? No  LIVING ENVIRONMENT: Lives with: lives alone Lives in: House/apartment Stairs: Yes: Internal: 3 steps; none Has following equipment at home: Single point cane and Joanne Lewis - 2 wheeled  OCCUPATION: retired   PLOF: Independent  PATIENT GOALS: be more mobile, walk with less pain, stand longer and have more stamina   NEXT MD VISIT: 07/2022  OBJECTIVE:   DIAGNOSTIC FINDINGS: N/A  PATIENT SURVEYS:  FOTO 50  COGNITION: Overall cognitive status: Within functional limits for tasks assessed     SENSATION: WFL  POSTURE: rounded shoulders and forward head  PALPATION: No TTP  LOWER EXTREMITY ROM:  Active ROM Right eval Left eval  Hip flexion    Hip extension    Hip abduction    Hip adduction    Hip internal rotation    Hip external rotation    Knee flexion    Knee extension    Ankle dorsiflexion    Ankle plantarflexion    Ankle inversion    Ankle eversion     (Blank rows = not tested)  LOWER EXTREMITY MMT:  MMT Right eval Left eval  Hip flexion 4 4+  Hip extension    Hip abduction 4 4+  Hip adduction    Hip internal rotation    Hip external rotation    Knee flexion 4+ 4+  Knee extension 4+ 4+  Ankle dorsiflexion    Ankle plantarflexion    Ankle inversion    Ankle eversion     (Blank rows = not tested)  FUNCTIONAL TESTS:  5 times sit to stand: 12.5 seconds 6 minute walk test: 1,085'   GAIT: Distance walked: 56' Assistive device utilized: None Level of assistance: Modified independence Comments: R lateral lean, decreased stance time on R, decreased DF on R   TODAY'S TREATMENT:                                                                                                                              DATE: 07/12/22  Subjective: Patient reports feeling  good today and eager to participate.   Treatment:  Nustep level 4 (seat 10, arms 9) x 10 minutes to improve flexibility and endurance   TRX mini squats 3 x 10 with B UE support TRX mini lunge 2 x 10 each  LE with B UE support   Side steps at agility ladder with BTB x 4 laps FWD/BWD walking at agility ladder with BTB x 3 laps    Step taps 6" step 2 x 20 3# AW alternating LE, no UE support   Stair negotiation 3 x 4 stairs with B rails, cues for sequencing and encouraging step over step pattern  Ambulation in hallway x 300' with 3# AW, cues for upright posture   Fwd 6" step 2 x 20 each LE/direction with B UE resting on rail (not today)  Standing heel/toe raises 2 x 10 with B UE resting on rail (not today)  PATIENT EDUCATION:  Education details: HEP, POC, goals Person educated: Patient Education method: Explanation, Demonstration, and Handouts Education comprehension: verbalized understanding and returned demonstration  HOME EXERCISE PROGRAM: Access Code: [email or web address] URL: [email or web address] Date: 06/29/2022 Prepared by: Maylon Peppers  Exercises - Standing Hip Abduction  - 2-3 x daily - 5-7 x weekly - 3 sets - 10 reps - Standing Hip Extension  - 2-3 x daily - 5-7 x weekly - 3 sets - 10 reps - Standing Hip Flexion AROM  - 2-3 x daily - 5-7 x weekly - 3 sets - 10 reps - Standing Marching  - 2-3 x daily - 5-7 x weekly - 3 sets - 10 reps  Access Code: [email or web address] URL: [email or web address] Date: 07/06/2022 Prepared by: Maylon Peppers  Exercises - Standing Hip Flexion with Resistance Loop  - 2-3 x daily - 5-7 x weekly - 3 sets - 10 reps - Hip Abduction with Resistance Loop  - 2-3 x daily - 5-7 x weekly - 3 sets - 10 reps - Hip Extension with Resistance Loop  - 2-3 x daily - 5-7 x weekly - 3 sets - 10 reps  ASSESSMENT:  CLINICAL IMPRESSION:    Patient continues to remain motivated to participate in order to improve strength in preparation for surgery.  Session focused on LE strengthening with continued difficulty with single leg activities on R. Included work on Museum/gallery curator with B rails with emphasis on sequencing. Patient will continue to benefit from skilled therapy to address remaining deficits in order to improve quality of life and LE strength in preparation for R TKA in August.   OBJECTIVE IMPAIRMENTS: Abnormal gait, decreased activity tolerance, decreased balance, decreased endurance, decreased mobility, difficulty walking, decreased ROM, decreased strength, impaired flexibility, improper body mechanics, postural dysfunction, and pain.   ACTIVITY LIMITATIONS: standing, squatting, stairs, and locomotion level  PARTICIPATION LIMITATIONS: cleaning, laundry, shopping, community activity, and yard work  PERSONAL FACTORS: Age, Fitness, Past/current experiences, and Time since onset of injury/illness/exacerbation are also affecting patient's functional outcome.   REHAB POTENTIAL: Fair    CLINICAL DECISION MAKING: Stable/uncomplicated  EVALUATION COMPLEXITY: Low   GOALS: Goals reviewed with patient? Yes  SHORT TERM GOALS: Target date: 07/27/2022  Patient will be independent in HEP to improve strength/mobility for better functional independence with ADLs. Baseline: Goal status: INITIAL   LONG TERM GOALS: Target date: 08/10/2022  Patient will increase FOTO score to equal to or greater than  63 to demonstrate statistically significant improvement in mobility and quality of life.  Baseline: 6/18: 50/100 Goal status: INITIAL  2.  Patient will increase BLE Lewis strength to 4+/5 as to improve functional strength for independent gait, increased standing tolerance and increased ADL ability. Baseline: 6/18: see above  Goal status: INITIAL  3.  Patient will increase six minute walk test distance to >1300' for progression to community ambulator and  improve gait ability Baseline: 6/18: 1085' Goal status: INITIAL  4.  Patient will be  able to tolerate >20 minutes of standing to be able to complete household and community activities to show improvement of mobility.  Baseline: 6/18: unable to tolerate 10-15 mins Goal status: INITIAL  PLAN:  PT FREQUENCY: 1-2x/week  PT DURATION: 6 weeks  PLANNED INTERVENTIONS: Therapeutic exercises, Therapeutic activity, Neuromuscular re-education, Balance training, Gait training, Patient/Family education, Self Care, Joint mobilization, Stair training, DME instructions, Spinal mobilization, Cryotherapy, Moist heat, and Manual therapy  PLAN FOR NEXT SESSION: hip strengthening   Maylon Peppers, PT, DPT Physical Therapist - [facility]  07/12/2022, 2:21 PM

## 2022-07-14 ENCOUNTER — Encounter: Payer: Self-pay | Admitting: Physical Therapy

## 2022-07-14 ENCOUNTER — Ambulatory Visit: Payer: Medicare Other

## 2022-07-14 DIAGNOSIS — M6281 Muscle weakness (generalized): Secondary | ICD-10-CM | POA: Diagnosis not present

## 2022-07-14 DIAGNOSIS — G8929 Other chronic pain: Secondary | ICD-10-CM

## 2022-07-14 DIAGNOSIS — R269 Unspecified abnormalities of gait and mobility: Secondary | ICD-10-CM

## 2022-07-14 DIAGNOSIS — M25661 Stiffness of right knee, not elsewhere classified: Secondary | ICD-10-CM

## 2022-07-14 NOTE — Therapy (Signed)
OUTPATIENT PHYSICAL THERAPY LOWER EXTREMITY TREATMENT   Patient Name: Joanne Lewis MRN: [id number] DOB:11/26/1945, 77 y.o., female Today's Date: 07/14/2022  END OF SESSION:  PT End of Session - 07/14/22 1026     Visit Number 6    Number of Visits 13    Date for PT Re-Evaluation 08/10/22    Authorization Type MEDICARE PART A AND B    Progress Note Due on Visit 10    PT Start Time 1030    PT Stop Time 1114    PT Time Calculation (min) 44 min    Activity Tolerance Patient tolerated treatment well    Behavior During Therapy WFL for tasks assessed/performed                 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HCC)    Arthritis    Branch retinal artery occlusion, left eye    Chronic kidney disease    moderate   Fatigue    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 11/25/2017   Hyperkalemia    Hyperlipidemia    Hypertension    Pneumonia    at least 10 years ago   Pre-diabetes    Past Surgical History:  Procedure Laterality Date   COLONOSCOPY WITH PROPOFOL N/A 08/09/2016   Procedure: COLONOSCOPY WITH PROPOFOL;  Surgeon: Scot Jun, MD;  Location: [facility] ENDOSCOPY;  Service: Endoscopy;  Laterality: N/A;   DILATATION & CURETTAGE/HYSTEROSCOPY WITH MYOSURE N/A 02/20/2018   Procedure: DILATATION & CURETTAGE/HYSTEROSCOPY WITH MYOSURE;  Surgeon: Schermerhorn, Ihor Austin, MD;  Location: [facility] ORS;  Service: Gynecology;  Laterality: N/A;   DILATION AND CURETTAGE OF UTERUS N/A 02/20/2018   Procedure: DILATATION AND CURETTAGE, fractional, resection of endometrial mass, possible myosure, hysteroscopy;  Surgeon: Schermerhorn, Ihor Austin, MD;  Location: [facility] ORS;  Service: Gynecology;  Laterality: N/A;   FEMORAL HERNIA REPAIR Right 11/26/2017   Procedure: HERNIA REPAIR FEMORAL with insertion or mesh;  Surgeon: Ancil Linsey, MD;  Location: [facility] ORS;  Service: General;  Laterality: Right;   HYSTEROSCOPY WITH D & C N/A  02/20/2018   Procedure: DILATATION AND CURETTAGE /HYSTEROSCOPY,;  Surgeon: Schermerhorn, Ihor Austin, MD;  Location: [facility] ORS;  Service: Gynecology;  Laterality: N/A;   TOTAL KNEE ARTHROPLASTY Left 08/11/2020   Procedure: TOTAL KNEE ARTHROPLASTY;  Surgeon: Ollen Gross, MD;  Location: [facility] ORS;  Service: Orthopedics;  Laterality: Left;   Patient Active Problem List   Diagnosis Date Noted   Osteoarthritis of knee 08/13/2020   OA (osteoarthritis) of knee 08/11/2020   Primary osteoarthritis of left knee 08/11/2020   SOB (shortness of breath) 10/10/2017   Coronary artery calcification 09/19/2016   Chest pain 02/03/2016   Elevated troponin 02/03/2016   Anxiety 02/03/2016   Essential hypertension 02/03/2016   Mixed hyperlipidemia 02/03/2016   Chest tightness 01/01/2016    PCP: Einar Crow, MD  REFERRING PROVIDER: Einar Crow, MD  REFERRING DIAG: [phone] (ICD-10-CM) - Osteoarthrosis, unspecified whether generalized or localized, lower leg M17.11 (ICD-10-CM) - Unilateral primary osteoarthritis, right knee   THERAPY DIAG:  Muscle weakness (generalized)  Gait difficulty  Knee joint stiffness, bilateral  Chronic pain of right knee  Rationale for Evaluation and Treatment: Rehabilitation  ONSET DATE: "years and years"   SUBJECTIVE:   SUBJECTIVE STATEMENT: Patient reports difficulty with standing for long periods of time and hurts a lot more with squatting to garden or pick up objects. Feels that it is grinding when she is bending over.  Unable to stand >10-15 minutes due to R knee pain. After L TKA, patient has more mobility but feels that she is overcompensating on L for the R knee pain. Patient reports tearing a muscle in her back late April/early May 2024 after pulling up a strong weed in her yard.   PERTINENT HISTORY: Planned R TKA on 08/16/22 by Dr. Trudee Grip. S/p L TKA on 08/11/2020 PAIN:  Are you having pain? Yes: NPRS scale: 4/10 Pain location: R knee pain  Pain  description: dull ache Aggravating factors: standing, squatting Relieving factors: sitting   PRECAUTIONS: Fall  WEIGHT BEARING RESTRICTIONS: No  FALLS:  Has patient fallen in last 6 months? No  LIVING ENVIRONMENT: Lives with: lives alone Lives in: House/apartment Stairs: Yes: Internal: 3 steps; none Has following equipment at home: Single point cane and Joanne Lewis - 2 wheeled  OCCUPATION: retired   PLOF: Independent  PATIENT GOALS: be more mobile, walk with less pain, stand longer and have more stamina   NEXT MD VISIT: 07/2022  OBJECTIVE:   DIAGNOSTIC FINDINGS: N/A  PATIENT SURVEYS:  FOTO 50  COGNITION: Overall cognitive status: Within functional limits for tasks assessed     SENSATION: WFL  POSTURE: rounded shoulders and forward head  PALPATION: No TTP  LOWER EXTREMITY ROM:  Active ROM Right eval Left eval  Hip flexion    Hip extension    Hip abduction    Hip adduction    Hip internal rotation    Hip external rotation    Knee flexion    Knee extension    Ankle dorsiflexion    Ankle plantarflexion    Ankle inversion    Ankle eversion     (Blank rows = not tested)  LOWER EXTREMITY MMT:  MMT Right eval Left eval  Hip flexion 4 4+  Hip extension    Hip abduction 4 4+  Hip adduction    Hip internal rotation    Hip external rotation    Knee flexion 4+ 4+  Knee extension 4+ 4+  Ankle dorsiflexion    Ankle plantarflexion    Ankle inversion    Ankle eversion     (Blank rows = not tested)  FUNCTIONAL TESTS:  5 times sit to stand: 12.5 seconds 6 minute walk test: 1,085'   GAIT: Distance walked: 40' Assistive device utilized: None Level of assistance: Modified independence Comments: R lateral lean, decreased stance time on R, decreased DF on R   TODAY'S TREATMENT:                                                                                                                              DATE: 07/14/22  Subjective: Patient feels sore and  tired after increasing activity with resistance band.   Treatment:  Nustep level 4 (seat 10, arms 9) x 10 minutes to improve flexibility and endurance    TRX mini squats 3 x 10 with B UE support TRX mini lunge 2  x 10 each LE with B UE support   Side steps in half of hallway with BTB x 3 laps FWD walking in half of hallway with BTB x 3 laps    Seated LAQ 3# AW 2 x 20 each LE   Step taps 6" step  x 20 3# AW alternating LE, no UE support  Step taps 12" step 2 x 15 with no UE support Fwd step ups 6" step 2 x 15 with no UE support, leading with each LE   PATIENT EDUCATION:  Education details: HEP, POC, goals Person educated: Patient Education method: Explanation, Demonstration, and Handouts Education comprehension: verbalized understanding and returned demonstration  HOME EXERCISE PROGRAM: Access Code: [email or web address] URL: [email or web address] Date: 06/29/2022 Prepared by: Maylon Peppers  Exercises - Standing Hip Abduction  - 2-3 x daily - 5-7 x weekly - 3 sets - 10 reps - Standing Hip Extension  - 2-3 x daily - 5-7 x weekly - 3 sets - 10 reps - Standing Hip Flexion AROM  - 2-3 x daily - 5-7 x weekly - 3 sets - 10 reps - Standing Marching  - 2-3 x daily - 5-7 x weekly - 3 sets - 10 reps  Access Code: [email or web address] URL: [email or web address] Date: 07/06/2022 Prepared by: Maylon Peppers  Exercises - Standing Hip Flexion with Resistance Loop  - 2-3 x daily - 5-7 x weekly - 3 sets - 10 reps - Hip Abduction with Resistance Loop  - 2-3 x daily - 5-7 x weekly - 3 sets - 10 reps - Hip Extension with Resistance Loop  - 2-3 x daily - 5-7 x weekly - 3 sets - 10 reps  ASSESSMENT:  CLINICAL IMPRESSION:    Patient continues to remain motivated to work hard during therapy session. Session focused on B LE strengthening dynamically. Continues to demonstrate R LE weakness with single leg activities. Patient will continue to benefit from skilled therapy to address remaining  deficits in order to improve quality of life and LE strength in preparation for R TKA in August.   OBJECTIVE IMPAIRMENTS: Abnormal gait, decreased activity tolerance, decreased balance, decreased endurance, decreased mobility, difficulty walking, decreased ROM, decreased strength, impaired flexibility, improper body mechanics, postural dysfunction, and pain.   ACTIVITY LIMITATIONS: standing, squatting, stairs, and locomotion level  PARTICIPATION LIMITATIONS: cleaning, laundry, shopping, community activity, and yard work  PERSONAL FACTORS: Age, Fitness, Past/current experiences, and Time since onset of injury/illness/exacerbation are also affecting patient's functional outcome.   REHAB POTENTIAL: Fair    CLINICAL DECISION MAKING: Stable/uncomplicated  EVALUATION COMPLEXITY: Low   GOALS: Goals reviewed with patient? Yes  SHORT TERM GOALS: Target date: 07/27/2022  Patient will be independent in HEP to improve strength/mobility for better functional independence with ADLs. Baseline: Goal status: INITIAL   LONG TERM GOALS: Target date: 08/10/2022  Patient will increase FOTO score to equal to or greater than  63 to demonstrate statistically significant improvement in mobility and quality of life.  Baseline: 6/18: 50/100 Goal status: INITIAL  2.  Patient will increase BLE gross strength to 4+/5 as to improve functional strength for independent gait, increased standing tolerance and increased ADL ability. Baseline: 6/18: see above  Goal status: INITIAL  3.  Patient will increase six minute walk test distance to >1300' for progression to community ambulator and improve gait ability Baseline: 6/18: 1085' Goal status: INITIAL  4.  Patient will be able to tolerate >20 minutes of standing to be able to complete household and community activities to show improvement of  mobility.  Baseline: 6/18: unable to tolerate 10-15 mins Goal status: INITIAL  PLAN:  PT FREQUENCY: 1-2x/week  PT  DURATION: 6 weeks  PLANNED INTERVENTIONS: Therapeutic exercises, Therapeutic activity, Neuromuscular re-education, Balance training, Gait training, Patient/Family education, Self Care, Joint mobilization, Stair training, DME instructions, Spinal mobilization, Cryotherapy, Moist heat, and Manual therapy  PLAN FOR NEXT SESSION: hip strengthening   Maylon Peppers, PT, DPT Physical Therapist - [place]  [facility]  07/14/2022, 10:27 AM

## 2022-07-20 ENCOUNTER — Ambulatory Visit: Payer: Medicare Other | Admitting: Physical Therapy

## 2022-07-22 ENCOUNTER — Ambulatory Visit: Payer: Medicare Other | Admitting: Physical Therapy

## 2022-07-27 ENCOUNTER — Ambulatory Visit: Payer: Medicare Other | Admitting: Physical Therapy

## 2022-07-27 ENCOUNTER — Encounter: Payer: Self-pay | Admitting: Physical Therapy

## 2022-07-27 DIAGNOSIS — M25661 Stiffness of right knee, not elsewhere classified: Secondary | ICD-10-CM

## 2022-07-27 DIAGNOSIS — M6281 Muscle weakness (generalized): Secondary | ICD-10-CM | POA: Diagnosis not present

## 2022-07-27 DIAGNOSIS — R269 Unspecified abnormalities of gait and mobility: Secondary | ICD-10-CM

## 2022-07-27 DIAGNOSIS — G8929 Other chronic pain: Secondary | ICD-10-CM

## 2022-07-27 NOTE — Therapy (Signed)
OUTPATIENT PHYSICAL THERAPY LOWER EXTREMITY TREATMENT   Patient Name: Joanne Lewis MRN: [id number] DOB:[birth year], 77 y.o., female Today's Date: 07/27/2022  END OF SESSION:  PT End of Session - 07/27/22 1308     Visit Number 7    Number of Visits 13    Date for PT Re-Evaluation 08/10/22    Authorization Type MEDICARE PART A AND B    Progress Note Due on Visit 10    PT Start Time 1300    PT Stop Time 1351    PT Time Calculation (min) 51 min    Activity Tolerance Patient tolerated treatment well    Behavior During Therapy WFL for tasks assessed/performed                 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HCC)    Arthritis    Branch retinal artery occlusion, left eye    Chronic kidney disease    moderate   Fatigue    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 11/25/2017   Hyperkalemia    Hyperlipidemia    Hypertension    Pneumonia    at least 10 years ago   Pre-diabetes    Past Surgical History:  Procedure Laterality Date   COLONOSCOPY WITH PROPOFOL N/A 08/09/2016   Procedure: COLONOSCOPY WITH PROPOFOL;  Surgeon: Scot Jun, MD;  Location: [facility] ENDOSCOPY;  Service: Endoscopy;  Laterality: N/A;   DILATATION & CURETTAGE/HYSTEROSCOPY WITH MYOSURE N/A 02/20/2018   Procedure: DILATATION & CURETTAGE/HYSTEROSCOPY WITH MYOSURE;  Surgeon: Schermerhorn, Ihor Austin, MD;  Location: [facility] ORS;  Service: Gynecology;  Laterality: N/A;   DILATION AND CURETTAGE OF UTERUS N/A 02/20/2018   Procedure: DILATATION AND CURETTAGE, fractional, resection of endometrial mass, possible myosure, hysteroscopy;  Surgeon: Schermerhorn, Ihor Austin, MD;  Location: [facility] ORS;  Service: Gynecology;  Laterality: N/A;   FEMORAL HERNIA REPAIR Right 11/26/2017   Procedure: HERNIA REPAIR FEMORAL with insertion or mesh;  Surgeon: Ancil Linsey, MD;  Location: [facility] ORS;  Service: General;  Laterality: Right;   HYSTEROSCOPY WITH D & C N/A  02/20/2018   Procedure: DILATATION AND CURETTAGE /HYSTEROSCOPY,;  Surgeon: Schermerhorn, Ihor Austin, MD;  Location: [facility] ORS;  Service: Gynecology;  Laterality: N/A;   TOTAL KNEE ARTHROPLASTY Left 08/11/2020   Procedure: TOTAL KNEE ARTHROPLASTY;  Surgeon: Ollen Gross, MD;  Location: [facility] ORS;  Service: Orthopedics;  Laterality: Left;   Patient Active Problem List   Diagnosis Date Noted   Osteoarthritis of knee 08/13/2020   OA (osteoarthritis) of knee 08/11/2020   Primary osteoarthritis of left knee 08/11/2020   SOB (shortness of breath) 10/10/2017   Coronary artery calcification 09/19/2016   Chest pain 02/03/2016   Elevated troponin 02/03/2016   Anxiety 02/03/2016   Essential hypertension 02/03/2016   Mixed hyperlipidemia 02/03/2016   Chest tightness 01/01/2016    PCP: Einar Crow, MD  REFERRING PROVIDER: Einar Crow, MD  REFERRING DIAG: [phone] (ICD-10-CM) - Osteoarthrosis, unspecified whether generalized or localized, lower leg M17.11 (ICD-10-CM) - Unilateral primary osteoarthritis, right knee   THERAPY DIAG:  Muscle weakness (generalized)  Gait difficulty  Knee joint stiffness, bilateral  Chronic pain of right knee  Rationale for Evaluation and Treatment: Rehabilitation  ONSET DATE: "years and years"   SUBJECTIVE:   SUBJECTIVE STATEMENT: Patient reports difficulty with standing for long periods of time and hurts a lot more with squatting to garden or pick up objects. Feels that it is grinding when she is bending over.  Unable to stand >10-15 minutes due to R knee pain. After L TKA, patient has more mobility but feels that she is overcompensating on L for the R knee pain. Patient reports tearing a muscle in her back late April/early May 2024 after pulling up a strong weed in her yard.   PERTINENT HISTORY: Planned R TKA on 08/16/22 by Dr. Trudee Grip. S/p L TKA on 08/11/2020 PAIN:  Are you having pain? Yes: NPRS scale: 4/10 Pain location: R knee pain  Pain  description: dull ache Aggravating factors: standing, squatting Relieving factors: sitting   PRECAUTIONS: Fall  WEIGHT BEARING RESTRICTIONS: No  FALLS:  Has patient fallen in last 6 months? No  LIVING ENVIRONMENT: Lives with: lives alone Lives in: House/apartment Stairs: Yes: Internal: 3 steps; none Has following equipment at home: Single point cane and Walker - 2 wheeled  OCCUPATION: retired   PLOF: Independent  PATIENT GOALS: be more mobile, walk with less pain, stand longer and have more stamina   NEXT MD VISIT: 07/2022  OBJECTIVE:   DIAGNOSTIC FINDINGS: N/A  PATIENT SURVEYS:  FOTO 50  COGNITION: Overall cognitive status: Within functional limits for tasks assessed     SENSATION: WFL  POSTURE: rounded shoulders and forward head  PALPATION: No TTP  LOWER EXTREMITY ROM:  Active ROM Right eval Left eval  Hip flexion    Hip extension    Hip abduction    Hip adduction    Hip internal rotation    Hip external rotation    Knee flexion    Knee extension    Ankle dorsiflexion    Ankle plantarflexion    Ankle inversion    Ankle eversion     (Blank rows = not tested)  LOWER EXTREMITY MMT:  MMT Right eval Left eval  Hip flexion 4 4+  Hip extension    Hip abduction 4 4+  Hip adduction    Hip internal rotation    Hip external rotation    Knee flexion 4+ 4+  Knee extension 4+ 4+  Ankle dorsiflexion    Ankle plantarflexion    Ankle inversion    Ankle eversion     (Blank rows = not tested)  FUNCTIONAL TESTS:  5 times sit to stand: 12.5 seconds 6 minute walk test: 1,085'   GAIT: Distance walked: 48' Assistive device utilized: None Level of assistance: Modified independence Comments: R lateral lean, decreased stance time on R, decreased DF on R   TODAY'S TREATMENT:                                                                                                                              DATE: 07/27/22  Subjective: Pt. Reports no new  complaints.  Pt./PT Discussed upcoming surgery on 8/5.  No reports of pain prior to tx. Session.     Treatment:  Nustep level 4 (seat 10, arms 9) x 10 minutes to improve flexibility and endurance.  Discussed weekend activities/ stair  climbing (fearful of stairs).      Walking marching in //-bars with light UE assist. Focus on hip/knee flexion.  4 laps.    Resisted gait in //-bars: 2BTB: 6x all 4-planes of movement (light to no UE assist)- discussed posture correction/ mirror feedback.    Seated/ standing 3# AW:  LAQ/ standing heel raises/ hip flex/ hamstring curls 30x each LE.     Step ups 6" step x 20 3# AW alternating LE, no UE support   TRX mini squats 3 x 10 with B UE support TRX mini lunge 2 x 10 each LE with B UE support   Not today Step taps 12" step 2 x 15 with no UE support Fwd step ups 6" step 2 x 15 with no UE support, leading with each LE   PATIENT EDUCATION:  Education details: HEP, POC, goals Person educated: Patient Education method: Explanation, Demonstration, and Handouts Education comprehension: verbalized understanding and returned demonstration  HOME EXERCISE PROGRAM: Access Code: [email or web address] URL: [email or web address] Date: 06/29/2022 Prepared by: Maylon Peppers  Exercises - Standing Hip Abduction  - 2-3 x daily - 5-7 x weekly - 3 sets - 10 reps - Standing Hip Extension  - 2-3 x daily - 5-7 x weekly - 3 sets - 10 reps - Standing Hip Flexion AROM  - 2-3 x daily - 5-7 x weekly - 3 sets - 10 reps - Standing Marching  - 2-3 x daily - 5-7 x weekly - 3 sets - 10 reps  Access Code: [email or web address] URL: [email or web address] Date: 07/06/2022 Prepared by: Maylon Peppers  Exercises - Standing Hip Flexion with Resistance Loop  - 2-3 x daily - 5-7 x weekly - 3 sets - 10 reps - Hip Abduction with Resistance Loop  - 2-3 x daily - 5-7 x weekly - 3 sets - 10 reps - Hip Extension with Resistance Loop  - 2-3 x daily - 5-7 x weekly - 3 sets - 10  reps  ASSESSMENT:  CLINICAL IMPRESSION:    Patient continues to remain motivated to work hard during therapy session. Session focused on B LE strengthening dynamically. Continues to demonstrate R LE weakness with single leg activities and R knee joint stiffness. Patient will continue to benefit from skilled therapy to address remaining deficits in order to improve quality of life and LE strength in preparation for R TKA in August.   OBJECTIVE IMPAIRMENTS: Abnormal gait, decreased activity tolerance, decreased balance, decreased endurance, decreased mobility, difficulty walking, decreased ROM, decreased strength, impaired flexibility, improper body mechanics, postural dysfunction, and pain.   ACTIVITY LIMITATIONS: standing, squatting, stairs, and locomotion level  PARTICIPATION LIMITATIONS: cleaning, laundry, shopping, community activity, and yard work  PERSONAL FACTORS: Age, Fitness, Past/current experiences, and Time since onset of injury/illness/exacerbation are also affecting patient's functional outcome.   REHAB POTENTIAL: Fair    CLINICAL DECISION MAKING: Stable/uncomplicated  EVALUATION COMPLEXITY: Low   GOALS: Goals reviewed with patient? Yes  SHORT TERM GOALS: Target date: 07/27/2022  Patient will be independent in HEP to improve strength/mobility for better functional independence with ADLs. Baseline: Goal status: Goal met   LONG TERM GOALS: Target date: 08/10/2022  Patient will increase FOTO score to equal to or greater than  63 to demonstrate statistically significant improvement in mobility and quality of life.  Baseline: 6/18: 50/100 Goal status: INITIAL  2.  Patient will increase BLE gross strength to 4+/5 as to improve functional strength for independent gait, increased standing tolerance and increased ADL ability. Baseline: 6/18: see above  Goal  status: INITIAL  3.  Patient will increase six minute walk test distance to >1300' for progression to community  ambulator and improve gait ability Baseline: 6/18: 1085' Goal status: INITIAL  4.  Patient will be able to tolerate >20 minutes of standing to be able to complete household and community activities to show improvement of mobility.  Baseline: 6/18: unable to tolerate 10-15 mins Goal status: INITIAL  PLAN:  PT FREQUENCY: 1-2x/week  PT DURATION: 6 weeks  PLANNED INTERVENTIONS: Therapeutic exercises, Therapeutic activity, Neuromuscular re-education, Balance training, Gait training, Patient/Family education, Self Care, Joint mobilization, Stair training, DME instructions, Spinal mobilization, Cryotherapy, Moist heat, and Manual therapy  PLAN FOR NEXT SESSION: hip strengthening   Cammie Mcgee, PT, DPT # [phone] Physical Therapist - [facility]  07/27/2022, 1:52 PM

## 2022-07-29 ENCOUNTER — Ambulatory Visit: Payer: Medicare Other

## 2022-07-29 ENCOUNTER — Encounter: Payer: Self-pay | Admitting: Physical Therapy

## 2022-07-29 DIAGNOSIS — M25661 Stiffness of right knee, not elsewhere classified: Secondary | ICD-10-CM

## 2022-07-29 DIAGNOSIS — M6281 Muscle weakness (generalized): Secondary | ICD-10-CM | POA: Diagnosis not present

## 2022-07-29 DIAGNOSIS — R269 Unspecified abnormalities of gait and mobility: Secondary | ICD-10-CM

## 2022-07-29 DIAGNOSIS — G8929 Other chronic pain: Secondary | ICD-10-CM

## 2022-07-29 NOTE — Patient Instructions (Signed)
SURGICAL WAITING ROOM VISITATION  Patients having surgery or a procedure may have no more than 2 support people in the waiting area - these visitors may rotate.    Children under the age of 44 must have an adult with them who is not the patient.  Due to an increase in RSV and influenza rates and associated hospitalizations, children ages 37 and under may not visit patients in [facility] hospitals.  If the patient needs to stay at the hospital during part of their recovery, the visitor guidelines for inpatient rooms apply. Pre-op nurse will coordinate an appropriate time for 1 support person to accompany patient in pre-op.  This support person may not rotate.    Please refer to the [facility] website for the visitor guidelines for Inpatients (after your surgery is over and you are in a regular room).       Your procedure is scheduled on: 08/16/22   Report to [facility] Main Entrance    Report to admitting at 5:50 AM   Call this number if you have problems the morning of surgery [phone]   Do not eat food :After Midnight.   After Midnight you may have the following liquids until 5:20 AM DAY OF SURGERY  Water Non-Citrus Juices (without pulp, NO RED-Apple, White grape, White cranberry) Black Coffee (NO MILK/CREAM OR CREAMERS, sugar ok)  Clear Tea (NO MILK/CREAM OR CREAMERS, sugar ok) regular and decaf                             Plain Jell-O (NO RED)                                           Fruit ices (not with fruit pulp, NO RED)                                     Popsicles (NO RED)                                                               Sports drinks like Gatorade (NO RED)                  The day of surgery:  Drink ONE (1) Pre-Surgery Clear Ensure at 5:20 AM the morning of surgery. Drink in one sitting. Do not sip.  This drink was given to you during your hospital  pre-op appointment visit. Nothing else to drink after completing the  Pre-Surgery Clear  Ensure       Oral Hygiene is also important to reduce your risk of infection.                                    Remember - BRUSH YOUR TEETH THE MORNING OF SURGERY WITH YOUR REGULAR TOOTHPASTE  DENTURES WILL BE REMOVED PRIOR TO SURGERY PLEASE DO NOT APPLY "Poly grip" OR ADHESIVES!!!   Take these medicines the morning of surgery : Amlodipine, Atorvastatin, Carvedilol  You may not have any metal on your body including hair pins, jewelry, and body piercing             Do not wear make-up, lotions, powders, perfumes, or deodorant  Do not wear nail polish including gel and S&S, artificial/acrylic nails, or any other type of covering on natural nails including finger and toenails. If you have artificial nails, gel coating, etc. that needs to be removed by a nail salon please have this removed prior to surgery or surgery may need to be canceled/ delayed if the surgeon/ anesthesia feels like they are unable to be safely monitored.   Do not shave  48 hours prior to surgery.    Do not bring valuables to the hospital. [facility] IS NOT             RESPONSIBLE   FOR VALUABLES.   Contacts, glasses, dentures or bridgework may not be worn into surgery.   Bring small overnight bag day of surgery.   DO NOT BRING YOUR HOME MEDICATIONS TO THE HOSPITAL. PHARMACY WILL DISPENSE MEDICATIONS LISTED ON YOUR MEDICATION LIST TO YOU DURING YOUR ADMISSION IN THE HOSPITAL!    Patients discharged on the day of surgery will not be allowed to drive home.  Someone NEEDS to stay with you for the first 24 hours after anesthesia.   Special Instructions: Bring a copy of your healthcare power of attorney and living will documents the day of surgery if you haven't scanned them before.              Please read over the following fact sheets you were given: IF YOU HAVE QUESTIONS ABOUT YOUR PRE-OP INSTRUCTIONS PLEASE CALL [phone] [facility]   If you received a COVID test during your pre-op visit  it is  requested that you wear a mask when out in public, stay away from anyone that may not be feeling well and notify your surgeon if you develop symptoms. If you test positive for Covid or have been in contact with anyone that has tested positive in the last 10 days please notify you surgeon.      Pre-operative 5 CHG Bath Instructions   You can play a key role in reducing the risk of infection after surgery. Your skin needs to be as free of germs as possible. You can reduce the number of germs on your skin by washing with CHG (chlorhexidine gluconate) soap before surgery. CHG is an antiseptic soap that kills germs and continues to kill germs even after washing.   DO NOT use if you have an allergy to chlorhexidine/CHG or antibacterial soaps. If your skin becomes reddened or irritated, stop using the CHG and notify one of our RNs at [phone].   Please shower with the CHG soap starting 4 days before surgery using the following schedule:     Please keep in mind the following:  DO NOT shave, including legs and underarms, starting the day of your first shower.   You may shave your face at any point before/day of surgery.  Place clean sheets on your bed the day you start using CHG soap. Use a clean washcloth (not used since being washed) for each shower. DO NOT sleep with pets once you start using the CHG.   CHG Shower Instructions:  If you choose to wash your hair and private area, wash first with your normal shampoo/soap.  After you use shampoo/soap, rinse your hair and body thoroughly to remove shampoo/soap residue.  Turn the  water OFF and apply about 3 tablespoons (45 ml) of CHG soap to a CLEAN washcloth.  Apply CHG soap ONLY FROM YOUR NECK DOWN TO YOUR TOES (washing for 3-5 minutes)  DO NOT use CHG soap on face, private areas, open wounds, or sores.  Pay special attention to the area where your surgery is being performed.  If you are having back surgery, having someone wash your back for  you may be helpful. Wait 2 minutes after CHG soap is applied, then you may rinse off the CHG soap.  Pat dry with a clean towel  Put on clean clothes/pajamas   If you choose to wear lotion, please use ONLY the CHG-compatible lotions on the back of this paper.     Additional instructions for the day of surgery: DO NOT APPLY any lotions, deodorants, cologne, or perfumes.   Put on clean/comfortable clothes.  Brush your teeth.  Ask your nurse before applying any prescription medications to the skin.      CHG Compatible Lotions   Aveeno Moisturizing lotion  Cetaphil Moisturizing Cream  Cetaphil Moisturizing Lotion  Clairol Herbal Essence Moisturizing Lotion, Dry Skin  Clairol Herbal Essence Moisturizing Lotion, Extra Dry Skin  Clairol Herbal Essence Moisturizing Lotion, Normal Skin  Curel Age Defying Therapeutic Moisturizing Lotion with Alpha Hydroxy  Curel Extreme Care Body Lotion  Curel Soothing Hands Moisturizing Hand Lotion  Curel Therapeutic Moisturizing Cream, Fragrance-Free  Curel Therapeutic Moisturizing Lotion, Fragrance-Free  Curel Therapeutic Moisturizing Lotion, Original Formula  Eucerin Daily Replenishing Lotion  Eucerin Dry Skin Therapy Plus Alpha Hydroxy Crme  Eucerin Dry Skin Therapy Plus Alpha Hydroxy Lotion  Eucerin Original Crme  Eucerin Original Lotion  Eucerin Plus Crme Eucerin Plus Lotion  Eucerin TriLipid Replenishing Lotion  Keri Anti-Bacterial Hand Lotion  Keri Deep Conditioning Original Lotion Dry Skin Formula Softly Scented  Keri Deep Conditioning Original Lotion, Fragrance Free Sensitive Skin Formula  Keri Lotion Fast Absorbing Fragrance Free Sensitive Skin Formula  Keri Lotion Fast Absorbing Softly Scented Dry Skin Formula  Keri Original Lotion  Keri Skin Renewal Lotion Keri Silky Smooth Lotion  Keri Silky Smooth Sensitive Skin Lotion  Nivea Body Creamy Conditioning Oil  Nivea Body Extra Enriched Lotion  Nivea Body Original Lotion  Nivea Body  Sheer Moisturizing Lotion Nivea Crme  Nivea Skin Firming Lotion  NutraDerm 30 Skin Lotion  NutraDerm Skin Lotion  NutraDerm Therapeutic Skin Cream  NutraDerm Therapeutic Skin Lotion  ProShield Protective Hand Cream  Provon moisturizing lotion Incentive Spirometer (Watch this video at home: [email or web address])  An incentive spirometer is a tool that can help keep your lungs clear and active. This tool measures how well you are filling your lungs with each breath. Taking long deep breaths may help reverse or decrease the chance of developing breathing (pulmonary) problems (especially infection) following: A long period of time when you are unable to move or be active. BEFORE THE PROCEDURE  If the spirometer includes an indicator to show your best effort, your nurse or respiratory therapist will set it to a desired goal. If possible, sit up straight or lean slightly forward. Try not to slouch. Hold the incentive spirometer in an upright position. INSTRUCTIONS FOR USE  Sit on the edge of your bed if possible, or sit up as far as you can in bed or on a chair. Hold the incentive spirometer in an upright position. Breathe out normally. Place the mouthpiece in your mouth and seal your lips tightly around it. Breathe in slowly and as deeply as  possible, raising the piston or the ball toward the top of the column. Hold your breath for 3-5 seconds or for as long as possible. Allow the piston or ball to fall to the bottom of the column. Remove the mouthpiece from your mouth and breathe out normally. Rest for a few seconds and repeat Steps 1 through 7 at least 10 times every 1-2 hours when you are awake. Take your time and take a few normal breaths between deep breaths. The spirometer may include an indicator to show your best effort. Use the indicator as a goal to work toward during each repetition. After each set of 10 deep breaths, practice coughing to be sure your lungs are  clear. If you have an incision (the cut made at the time of surgery), support your incision when coughing by placing a pillow or rolled up towels firmly against it. Once you are able to get out of bed, walk around indoors and cough well. You may stop using the incentive spirometer when instructed by your caregiver.  RISKS AND COMPLICATIONS Take your time so you do not get dizzy or light-headed. If you are in pain, you may need to take or ask for pain medication before doing incentive spirometry. It is harder to take a deep breath if you are having pain. AFTER USE Rest and breathe slowly and easily. It can be helpful to keep track of a log of your progress. Your caregiver can provide you with a simple table to help with this. If you are using the spirometer at home, follow these instructions: SEEK MEDICAL CARE IF:  You are having difficultly using the spirometer. You have trouble using the spirometer as often as instructed. Your pain medication is not giving enough relief while using the spirometer. You develop fever of 100.5 F (38.1 C) or higher. SEEK IMMEDIATE MEDICAL CARE IF:  You cough up bloody sputum that had not been present before. You develop fever of 102 F (38.9 C) or greater. You develop worsening pain at or near the incision site. MAKE SURE YOU:  Understand these instructions. Will watch your condition. Will get help right away if you are not doing well or get worse. Document Released: 05/10/2006 Document Revised: 03/22/2011 Document Reviewed: 07/11/2006 [facility] Patient Information 2014 [place], [place].

## 2022-07-29 NOTE — Therapy (Signed)
OUTPATIENT PHYSICAL THERAPY LOWER EXTREMITY TREATMENT   Patient Name: Joanne Lewis MRN: [id number] DOB:Jul 28, 1945, 77 y.o., female Today's Date: 07/29/2022  END OF SESSION:  PT End of Session - 07/29/22 1518     Visit Number 8    Number of Visits 13    Date for PT Re-Evaluation 08/10/22    Authorization Type MEDICARE PART A AND B    Progress Note Due on Visit 10    PT Start Time 1516    PT Stop Time 1558    PT Time Calculation (min) 42 min    Activity Tolerance Patient tolerated treatment well    Behavior During Therapy WFL for tasks assessed/performed                  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HCC)    Arthritis    Branch retinal artery occlusion, left eye    Chronic kidney disease    moderate   Fatigue    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 11/25/2017   Hyperkalemia    Hyperlipidemia    Hypertension    Pneumonia    at least 10 years ago   Pre-diabetes    Past Surgical History:  Procedure Laterality Date   COLONOSCOPY WITH PROPOFOL N/A 08/09/2016   Procedure: COLONOSCOPY WITH PROPOFOL;  Surgeon: Joanne Jun, MD;  Location: [facility] ENDOSCOPY;  Service: Endoscopy;  Laterality: N/A;   DILATATION & CURETTAGE/HYSTEROSCOPY WITH MYOSURE N/A 02/20/2018   Procedure: DILATATION & CURETTAGE/HYSTEROSCOPY WITH MYOSURE;  Surgeon: Lewis, Joanne Austin, MD;  Location: [facility] ORS;  Service: Gynecology;  Laterality: N/A;   DILATION AND CURETTAGE OF UTERUS N/A 02/20/2018   Procedure: DILATATION AND CURETTAGE, fractional, resection of endometrial mass, possible myosure, hysteroscopy;  Surgeon: Lewis, Joanne Austin, MD;  Location: [facility] ORS;  Service: Gynecology;  Laterality: N/A;   FEMORAL HERNIA REPAIR Right 11/26/2017   Procedure: HERNIA REPAIR FEMORAL with insertion or mesh;  Surgeon: Joanne Linsey, MD;  Location: [facility] ORS;  Service: General;  Laterality: Right;   HYSTEROSCOPY WITH D & C N/A  02/20/2018   Procedure: DILATATION AND CURETTAGE /HYSTEROSCOPY,;  Surgeon: Lewis, Joanne Austin, MD;  Location: [facility] ORS;  Service: Gynecology;  Laterality: N/A;   TOTAL KNEE ARTHROPLASTY Left 08/11/2020   Procedure: TOTAL KNEE ARTHROPLASTY;  Surgeon: Joanne Gross, MD;  Location: [facility] ORS;  Service: Orthopedics;  Laterality: Left;   Patient Active Problem List   Diagnosis Date Noted   Osteoarthritis of knee 08/13/2020   OA (osteoarthritis) of knee 08/11/2020   Primary osteoarthritis of left knee 08/11/2020   SOB (shortness of breath) 10/10/2017   Coronary artery calcification 09/19/2016   Chest pain 02/03/2016   Elevated troponin 02/03/2016   Anxiety 02/03/2016   Essential hypertension 02/03/2016   Mixed hyperlipidemia 02/03/2016   Chest tightness 01/01/2016    PCP: Joanne Crow, MD  REFERRING PROVIDER: Einar Crow, MD  REFERRING DIAG: [phone] (ICD-10-CM) - Osteoarthrosis, unspecified whether generalized or localized, lower leg M17.11 (ICD-10-CM) - Unilateral primary osteoarthritis, right knee   THERAPY DIAG:  Muscle weakness (generalized)  Gait difficulty  Knee joint stiffness, bilateral  Chronic pain of right knee  Rationale for Evaluation and Treatment: Rehabilitation  ONSET DATE: "years and years"   SUBJECTIVE:   SUBJECTIVE STATEMENT: Patient reports difficulty with standing for long periods of time and hurts a lot more with squatting to garden or pick up objects. Feels that it is grinding when she is bending  over. Unable to stand >10-15 minutes due to R knee pain. After L TKA, patient has more mobility but feels that she is overcompensating on L for the R knee pain. Patient reports tearing a muscle in her back late April/early May 2024 after pulling up a strong weed in her yard.   PERTINENT HISTORY: Planned R TKA on 08/16/22 by Joanne Lewis. S/p L TKA on 08/11/2020 PAIN:  Are you having pain? Yes: NPRS scale: 4/10 Pain location: R knee pain  Pain  description: dull ache Aggravating factors: standing, squatting Relieving factors: sitting   PRECAUTIONS: Fall  WEIGHT BEARING RESTRICTIONS: No  FALLS:  Has patient fallen in last 6 months? No  LIVING ENVIRONMENT: Lives with: lives alone Lives in: House/apartment Stairs: Yes: Internal: 3 steps; none Has following equipment at home: Single point cane and Joanne Lewis - 2 wheeled  OCCUPATION: retired   PLOF: Independent  PATIENT GOALS: be more mobile, walk with less pain, stand longer and have more stamina   NEXT MD VISIT: 07/2022  OBJECTIVE:   DIAGNOSTIC FINDINGS: N/A  PATIENT SURVEYS:  FOTO 50  COGNITION: Overall cognitive status: Within functional limits for tasks assessed     SENSATION: WFL  POSTURE: rounded shoulders and forward head  PALPATION: No TTP  LOWER EXTREMITY ROM:  Active ROM Right eval Left eval  Hip flexion    Hip extension    Hip abduction    Hip adduction    Hip internal rotation    Hip external rotation    Knee flexion    Knee extension    Ankle dorsiflexion    Ankle plantarflexion    Ankle inversion    Ankle eversion     (Blank rows = not tested)  LOWER EXTREMITY MMT:  MMT Right eval Left eval  Hip flexion 4 4+  Hip extension    Hip abduction 4 4+  Hip adduction    Hip internal rotation    Hip external rotation    Knee flexion 4+ 4+  Knee extension 4+ 4+  Ankle dorsiflexion    Ankle plantarflexion    Ankle inversion    Ankle eversion     (Blank rows = not tested)  FUNCTIONAL TESTS:  5 times sit to stand: 12.5 seconds 6 minute walk test: 1,085'   GAIT: Distance walked: 72' Assistive device utilized: None Level of assistance: Modified independence Comments: R lateral lean, decreased stance time on R, decreased DF on R   TODAY'S TREATMENT:                                                                                                                              DATE: 07/29/22  Subjective: Pt. Reports no new  complaints. No reports of pain prior to tx. Session.     Treatment:  Nustep level 4 (seat 10, arms 9) x 10 minutes to improve flexibility and endurance.        Walking marching with 3# AW @  agility ladder with no UE assist. Focus on hip/knee flexion.  4 laps.    Seated/ standing 3# AW:  LAQ/ standing heel raises/ hip abd/ hamstring curls 30x each LE.       Step ups 6" step x 20 3# AW alternating LE, no UE support   TRX mini squats 3 x 10 with B UE support TRX mini lunge 2 x 10 each LE with B UE support   Not today Step taps 12" step 2 x 15 with no UE support Fwd step ups 6" step 2 x 15 with no UE support, leading with each LE   PATIENT EDUCATION:  Education details: HEP, POC, goals Person educated: Patient Education method: Explanation, Demonstration, and Handouts Education comprehension: verbalized understanding and returned demonstration  HOME EXERCISE PROGRAM: Access Code: [email or web address] URL: [email or web address] Date: 06/29/2022 Prepared by: Maylon Peppers  Exercises - Standing Hip Abduction  - 2-3 x daily - 5-7 x weekly - 3 sets - 10 reps - Standing Hip Extension  - 2-3 x daily - 5-7 x weekly - 3 sets - 10 reps - Standing Hip Flexion AROM  - 2-3 x daily - 5-7 x weekly - 3 sets - 10 reps - Standing Marching  - 2-3 x daily - 5-7 x weekly - 3 sets - 10 reps  Access Code: [email or web address] URL: [email or web address] Date: 07/06/2022 Prepared by: Maylon Peppers  Exercises - Standing Hip Flexion with Resistance Loop  - 2-3 x daily - 5-7 x weekly - 3 sets - 10 reps - Hip Abduction with Resistance Loop  - 2-3 x daily - 5-7 x weekly - 3 sets - 10 reps - Hip Extension with Resistance Loop  - 2-3 x daily - 5-7 x weekly - 3 sets - 10 reps  ASSESSMENT:  CLINICAL IMPRESSION:    Patient continues to remain motivated to work hard during therapy session. Session focused on B LE strengthening dynamically. Continues to demonstrate R LE weakness with single leg  activities and R knee joint stiffness. Patient will continue to benefit from skilled therapy to address remaining deficits in order to improve quality of life and LE strength in preparation for R TKA in August.   OBJECTIVE IMPAIRMENTS: Abnormal gait, decreased activity tolerance, decreased balance, decreased endurance, decreased mobility, difficulty walking, decreased ROM, decreased strength, impaired flexibility, improper body mechanics, postural dysfunction, and pain.   ACTIVITY LIMITATIONS: standing, squatting, stairs, and locomotion level  PARTICIPATION LIMITATIONS: cleaning, laundry, shopping, community activity, and yard work  PERSONAL FACTORS: Age, Fitness, Past/current experiences, and Time since onset of injury/illness/exacerbation are also affecting patient's functional outcome.   REHAB POTENTIAL: Fair    CLINICAL DECISION MAKING: Stable/uncomplicated  EVALUATION COMPLEXITY: Low   GOALS: Goals reviewed with patient? Yes  SHORT TERM GOALS: Target date: 07/27/2022  Patient will be independent in HEP to improve strength/mobility for better functional independence with ADLs. Baseline: Goal status: Goal met   LONG TERM GOALS: Target date: 08/10/2022  Patient will increase FOTO score to equal to or greater than  63 to demonstrate statistically significant improvement in mobility and quality of life.  Baseline: 6/18: 50/100 Goal status: INITIAL  2.  Patient will increase BLE Lewis strength to 4+/5 as to improve functional strength for independent gait, increased standing tolerance and increased ADL ability. Baseline: 6/18: see above  Goal status: INITIAL  3.  Patient will increase six minute walk test distance to >1300' for progression to community ambulator and improve gait ability Baseline: 6/18: 1085' Goal status: INITIAL  4.  Patient will be able to tolerate >20 minutes of standing to be able to complete household and community activities to show improvement of mobility.   Baseline: 6/18: unable to tolerate 10-15 mins Goal status: INITIAL  PLAN:  PT FREQUENCY: 1-2x/week  PT DURATION: 6 weeks  PLANNED INTERVENTIONS: Therapeutic exercises, Therapeutic activity, Neuromuscular re-education, Balance training, Gait training, Patient/Family education, Self Care, Joint mobilization, Stair training, DME instructions, Spinal mobilization, Cryotherapy, Moist heat, and Manual therapy  PLAN FOR NEXT SESSION: hip strengthening   Maylon Peppers, PT, DPT  Physical Therapist - [facility]  [facility]  07/29/2022, 3:59 PM

## 2022-07-29 NOTE — Progress Notes (Signed)
COVID Vaccine received:  []  No [x]  Yes Date of any COVID positive Test in last 90 days: No PCP - Einar Crow MD Cardiologist - Julien Nordmann MD  Medical clearance Dr. Einar Crow 05/10/22  Chest x-ray - 2024 EKG -  08/03/22 EPIC Stress Test - 01/02/16 Epic ECHO - no Cardiac Cath -no   Bowel Prep - [x]  No  []   Yes ______  Pacemaker / ICD device [x]  No []  Yes   Spinal Cord Stimulator:[x]  No []  Yes       History of Sleep Apnea? [x]  No []  Yes   CPAP used?- [x]  No []  Yes    Does the patient monitor blood sugar?          [x]  No []  Yes  []  N/A  Patient has: [x]  NO Hx DM   [x]  Pre-DM                 []  DM1  []   DM2 Does patient have a [employer] or Dex[employer]om? []  No []  Yes   Fasting Blood Sugar Ranges-  Checks Blood Sugar __0___ times a day  GLP1 agonist / usual dose - No GLP1 instructions:  SGLT-2 inhibitors / usual dose - No SGLT-2 instructions:   Blood Thinner / Instructions:No Aspirin Instructions:No  Comments:   Activity level: Patient is able to climb a flight of stairs without difficulty; [x]  No CP  [x]  No SOB,    Patient can /perform ADLs without assistance.   Anesthesia review: HTN, Coronary artery calcification, CAD ,CKD, R BBB  Patient denies shortness of breath, fever, cough and chest pain at PAT appointment.  Patient verbalized understanding and agreement to the Pre-Surgical Instructions that were given to them at this PAT appointment. Patient was also educated of the need to review these PAT instructions again prior to his/her surgery.I reviewed the appropriate phone numbers to call if they have any and questions or concerns.

## 2022-08-03 ENCOUNTER — Ambulatory Visit: Payer: Medicare Other | Admitting: Physical Therapy

## 2022-08-03 ENCOUNTER — Other Ambulatory Visit: Payer: Self-pay

## 2022-08-03 ENCOUNTER — Encounter (HOSPITAL_COMMUNITY): Payer: Self-pay

## 2022-08-03 ENCOUNTER — Encounter (HOSPITAL_COMMUNITY)
Admission: RE | Admit: 2022-08-03 | Discharge: 2022-08-03 | Disposition: A | Discharge status: Home / Self Care | Payer: Medicare Other | Source: Ambulatory Visit | Attending: Orthopedic Surgery | Admitting: Orthopedic Surgery

## 2022-08-03 VITALS — BP 177/100 | HR 61 | Temp 98.2°F | Resp 16 | Ht 64.0 in | Wt 185.0 lb

## 2022-08-03 DIAGNOSIS — Z01818 Encounter for other preprocedural examination: Secondary | ICD-10-CM | POA: Insufficient documentation

## 2022-08-03 DIAGNOSIS — I10 Essential (primary) hypertension: Secondary | ICD-10-CM | POA: Diagnosis not present

## 2022-08-03 LAB — SURGICAL PCR SCREEN
MRSA, PCR: NEGATIVE
Staphylococcus aureus: NEGATIVE

## 2022-08-03 LAB — CBC
HCT: 40.8 % (ref 36.0–46.0)
Hemoglobin: 13.1 g/dL (ref 12.0–15.0)
MCH: 29.9 pg (ref 26.0–34.0)
MCHC: 32.1 g/dL (ref 30.0–36.0)
MCV: 93.2 fL (ref 80.0–100.0)
Platelets: 204 10*3/uL (ref 150–400)
RBC: 4.38 MIL/uL (ref 3.87–5.11)
RDW: 12.6 % (ref 11.5–15.5)
WBC: 5.4 10*3/uL (ref 4.0–10.5)
nRBC: 0 % (ref 0.0–0.2)

## 2022-08-03 LAB — BASIC METABOLIC PANEL
Anion gap: 9 (ref 5–15)
BUN: 20 mg/dL (ref 8–23)
CO2: 23 mmol/L (ref 22–32)
Calcium: 8.9 mg/dL (ref 8.9–10.3)
Chloride: 106 mmol/L (ref 98–111)
Creatinine, Ser: 0.93 mg/dL (ref 0.44–1.00)
GFR, Estimated: >60 mL/min (ref >60)
Glucose, Bld: 105 mg/dL — ABNORMAL HIGH (ref 70–99)
Potassium: 4.1 mmol/L (ref 3.5–5.1)
Sodium: 138 mmol/L (ref 135–145)

## 2022-08-05 ENCOUNTER — Ambulatory Visit: Payer: Medicare Other

## 2022-08-05 ENCOUNTER — Encounter: Payer: Self-pay | Admitting: Physical Therapy

## 2022-08-05 DIAGNOSIS — M25661 Stiffness of right knee, not elsewhere classified: Secondary | ICD-10-CM

## 2022-08-05 DIAGNOSIS — G8929 Other chronic pain: Secondary | ICD-10-CM

## 2022-08-05 DIAGNOSIS — M6281 Muscle weakness (generalized): Secondary | ICD-10-CM | POA: Diagnosis not present

## 2022-08-05 DIAGNOSIS — R269 Unspecified abnormalities of gait and mobility: Secondary | ICD-10-CM

## 2022-08-05 NOTE — Therapy (Signed)
OUTPATIENT PHYSICAL THERAPY LOWER EXTREMITY TREATMENT   Patient Name: Joanne Lewis MRN: [id number] DOB:May 26, 1945, 77 y.o., female Today's Date: 08/05/2022  END OF SESSION:  PT End of Session - 08/05/22 1253     Visit Number 9    Number of Visits 13    Date for PT Re-Evaluation 08/10/22    Authorization Type MEDICARE PART A AND B    Progress Note Due on Visit 10    PT Start Time 1256    PT Stop Time 1342    PT Time Calculation (min) 46 min    Activity Tolerance Patient tolerated treatment well    Behavior During Therapy WFL for tasks assessed/performed                   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HCC)    Arthritis    Branch retinal artery occlusion, left eye    Chronic kidney disease    moderate   Fatigue    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 11/25/2017   Hyperkalemia    Hyperlipidemia    Hypertension    Pneumonia    at least 10 years ago   Pre-diabetes    Past Surgical History:  Procedure Laterality Date   COLONOSCOPY WITH PROPOFOL N/A 08/09/2016   Procedure: COLONOSCOPY WITH PROPOFOL;  Surgeon: Joanne Jun, MD;  Location: [facility] ENDOSCOPY;  Service: Endoscopy;  Laterality: N/A;   DILATATION & CURETTAGE/HYSTEROSCOPY WITH MYOSURE N/A 02/20/2018   Procedure: DILATATION & CURETTAGE/HYSTEROSCOPY WITH MYOSURE;  Surgeon: Schermerhorn, Ihor Austin, MD;  Location: [facility] ORS;  Service: Gynecology;  Laterality: N/A;   DILATION AND CURETTAGE OF UTERUS N/A 02/20/2018   Procedure: DILATATION AND CURETTAGE, fractional, resection of endometrial mass, possible myosure, hysteroscopy;  Surgeon: Schermerhorn, Ihor Austin, MD;  Location: [facility] ORS;  Service: Gynecology;  Laterality: N/A;   FEMORAL HERNIA REPAIR Right 11/26/2017   Procedure: HERNIA REPAIR FEMORAL with insertion or mesh;  Surgeon: Joanne Linsey, MD;  Location: [facility] ORS;  Service: General;  Laterality: Right;   HYSTEROSCOPY WITH D & C N/A  02/20/2018   Procedure: DILATATION AND CURETTAGE /HYSTEROSCOPY,;  Surgeon: Schermerhorn, Ihor Austin, MD;  Location: [facility] ORS;  Service: Gynecology;  Laterality: N/A;   TOTAL KNEE ARTHROPLASTY Left 08/11/2020   Procedure: TOTAL KNEE ARTHROPLASTY;  Surgeon: Joanne Gross, MD;  Location: [facility] ORS;  Service: Orthopedics;  Laterality: Left;   Patient Active Problem List   Diagnosis Date Noted   Osteoarthritis of knee 08/13/2020   OA (osteoarthritis) of knee 08/11/2020   Primary osteoarthritis of left knee 08/11/2020   SOB (shortness of breath) 10/10/2017   Coronary artery calcification 09/19/2016   Chest pain 02/03/2016   Elevated troponin 02/03/2016   Anxiety 02/03/2016   Essential hypertension 02/03/2016   Mixed hyperlipidemia 02/03/2016   Chest tightness 01/01/2016    PCP: Joanne Crow, MD  REFERRING PROVIDER: Einar Crow, MD  REFERRING DIAG: [phone] (ICD-10-CM) - Osteoarthrosis, unspecified whether generalized or localized, lower leg M17.11 (ICD-10-CM) - Unilateral primary osteoarthritis, right knee   THERAPY DIAG:  Muscle weakness (generalized)  Gait difficulty  Knee joint stiffness, bilateral  Chronic pain of right knee  Rationale for Evaluation and Treatment: Rehabilitation  ONSET DATE: "years and years"   SUBJECTIVE:   SUBJECTIVE STATEMENT: Patient reports difficulty with standing for long periods of time and hurts a lot more with squatting to garden or pick up objects. Feels that it is grinding when she is  bending over. Unable to stand >10-15 minutes due to R knee pain. After L TKA, patient has more mobility but feels that she is overcompensating on L for the R knee pain. Patient reports tearing a muscle in her back late April/early May 2024 after pulling up a strong weed in her yard.   PERTINENT HISTORY: Planned R TKA on 08/16/22 by Dr. Trudee Grip. S/p L TKA on 08/11/2020 PAIN:  Are you having pain? Yes: NPRS scale: 4/10 Pain location: R knee pain  Pain  description: dull ache Aggravating factors: standing, squatting Relieving factors: sitting   PRECAUTIONS: Fall  WEIGHT BEARING RESTRICTIONS: No  FALLS:  Has patient fallen in last 6 months? No  LIVING ENVIRONMENT: Lives with: lives alone Lives in: House/apartment Stairs: Yes: Internal: 3 steps; none Has following equipment at home: Single point cane and Joanne Lewis - 2 wheeled  OCCUPATION: retired   PLOF: Independent  PATIENT GOALS: be more mobile, walk with less pain, stand longer and have more stamina   NEXT MD VISIT: 07/2022  OBJECTIVE:   DIAGNOSTIC FINDINGS: N/A  PATIENT SURVEYS:  FOTO 50  COGNITION: Overall cognitive status: Within functional limits for tasks assessed     SENSATION: WFL  POSTURE: rounded shoulders and forward head  PALPATION: No TTP  LOWER EXTREMITY ROM:  Active ROM Right eval Left eval  Hip flexion    Hip extension    Hip abduction    Hip adduction    Hip internal rotation    Hip external rotation    Knee flexion    Knee extension    Ankle dorsiflexion    Ankle plantarflexion    Ankle inversion    Ankle eversion     (Blank rows = not tested)  LOWER EXTREMITY MMT:  MMT Right eval Left eval  Hip flexion 4 4+  Hip extension    Hip abduction 4 4+  Hip adduction    Hip internal rotation    Hip external rotation    Knee flexion 4+ 4+  Knee extension 4+ 4+  Ankle dorsiflexion    Ankle plantarflexion    Ankle inversion    Ankle eversion     (Blank rows = not tested)  FUNCTIONAL TESTS:  5 times sit to stand: 12.5 seconds 6 minute walk test: 1,085'   GAIT: Distance walked: 82' Assistive device utilized: None Level of assistance: Modified independence Comments: R lateral lean, decreased stance time on R, decreased DF on R   TODAY'S TREATMENT:                                                                                                                              DATE: 08/05/22  Subjective: Patient reports pain in L  knee on the sides. Unsure of reasoning.     Treatment:  Nustep level 5 (seat 10, arms 9) x 10 minutes to improve flexibility and endurance.        Walking marching with 3# AW @  agility ladder with no UE assist. Focus on hip/knee flexion.  4 laps.    Sidesteps/fwd/bwd  with 3# AW at agility ladder with no UE assist   Seated/ standing 3# AW:  LAQ/ standing heel raises/ hip abd 20x each LE.        Step ups 6" step x 20 3# AW, each LE with no UE support  Step taps 6" step x 20 3# AW, each LE   Mini squats to low mat table 2 x 20 with no UE support  Not today Step taps 12" step 2 x 15 with no UE support Fwd step ups 6" step 2 x 15 with no UE support, leading with each LE  TRX mini lunge 2 x 10 each LE with B UE support   PATIENT EDUCATION:  Education details: HEP, POC, goals Person educated: Patient Education method: Explanation, Demonstration, and Handouts Education comprehension: verbalized understanding and returned demonstration  HOME EXERCISE PROGRAM: Access Code: [email or web address] URL: [email or web address] Date: 06/29/2022 Prepared by: Maylon Peppers  Exercises - Standing Hip Abduction  - 2-3 x daily - 5-7 x weekly - 3 sets - 10 reps - Standing Hip Extension  - 2-3 x daily - 5-7 x weekly - 3 sets - 10 reps - Standing Hip Flexion AROM  - 2-3 x daily - 5-7 x weekly - 3 sets - 10 reps - Standing Marching  - 2-3 x daily - 5-7 x weekly - 3 sets - 10 reps  Access Code: [email or web address] URL: [email or web address] Date: 07/06/2022 Prepared by: Maylon Peppers  Exercises - Standing Hip Flexion with Resistance Loop  - 2-3 x daily - 5-7 x weekly - 3 sets - 10 reps - Hip Abduction with Resistance Loop  - 2-3 x daily - 5-7 x weekly - 3 sets - 10 reps - Hip Extension with Resistance Loop  - 2-3 x daily - 5-7 x weekly - 3 sets - 10 reps  ASSESSMENT:  CLINICAL IMPRESSION:    Patient continues to remain motivated to work hard during therapy session. Session focused on B  LE strengthening dynamically. Continues to demonstrate R LE weakness with single leg activities and R knee joint stiffness. Patient will continue to benefit from skilled therapy to address remaining deficits in order to improve quality of life and LE strength in preparation for R TKA in August.   OBJECTIVE IMPAIRMENTS: Abnormal gait, decreased activity tolerance, decreased balance, decreased endurance, decreased mobility, difficulty walking, decreased ROM, decreased strength, impaired flexibility, improper body mechanics, postural dysfunction, and pain.   ACTIVITY LIMITATIONS: standing, squatting, stairs, and locomotion level  PARTICIPATION LIMITATIONS: cleaning, laundry, shopping, community activity, and yard work  PERSONAL FACTORS: Age, Fitness, Past/current experiences, and Time since onset of injury/illness/exacerbation are also affecting patient's functional outcome.   REHAB POTENTIAL: Fair    CLINICAL DECISION MAKING: Stable/uncomplicated  EVALUATION COMPLEXITY: Low   GOALS: Goals reviewed with patient? Yes  SHORT TERM GOALS: Target date: 07/27/2022  Patient will be independent in HEP to improve strength/mobility for better functional independence with ADLs. Baseline: Goal status: Goal met   LONG TERM GOALS: Target date: 08/10/2022  Patient will increase FOTO score to equal to or greater than  63 to demonstrate statistically significant improvement in mobility and quality of life.  Baseline: 6/18: 50/100 Goal status: INITIAL  2.  Patient will increase BLE Lewis strength to 4+/5 as to improve functional strength for independent gait, increased standing tolerance and increased ADL ability. Baseline: 6/18: see above  Goal status: INITIAL  3.  Patient will increase six minute walk test distance to >1300' for progression to community ambulator and improve gait ability Baseline: 6/18: 1085' Goal status: INITIAL  4.  Patient will be able to tolerate >20 minutes of standing to be  able to complete household and community activities to show improvement of mobility.  Baseline: 6/18: unable to tolerate 10-15 mins Goal status: INITIAL  PLAN:  PT FREQUENCY: 1-2x/week  PT DURATION: 6 weeks  PLANNED INTERVENTIONS: Therapeutic exercises, Therapeutic activity, Neuromuscular re-education, Balance training, Gait training, Patient/Family education, Self Care, Joint mobilization, Stair training, DME instructions, Spinal mobilization, Cryotherapy, Moist heat, and Manual therapy  PLAN FOR NEXT SESSION: hip strengthening   Maylon Peppers, PT, DPT  Physical Therapist - [place]  [facility]  08/05/2022, 1:43 PM

## 2022-08-09 NOTE — H&P (Signed)
TOTAL KNEE ADMISSION H&P  Patient is being admitted for right total knee arthroplasty.  Subjective:  Chief Complaint: Right knee pain.  HPI: Joanne Lewis, 77 y.o. female has a history of pain and functional disability in the right knee due to arthritis and has failed non-surgical conservative treatments for greater than 12 weeks to include corticosteriod injections, viscosupplementation injections, and activity modification. Onset of symptoms was gradual, starting 8 years ago with gradually worsening course since that time. The patient noted no past surgery on the right knee.  Patient currently rates pain in the right knee at 8 out of 10 with activity. Patient has worsening of pain with activity and weight bearing and pain that interferes with activities of daily living. Patient has evidence of periarticular osteophytes and joint space narrowing by imaging studies. There is no active infection.  Patient Active Problem List   Diagnosis Date Noted   Osteoarthritis of knee 08/13/2020   OA (osteoarthritis) of knee 08/11/2020   Primary osteoarthritis of left knee 08/11/2020   SOB (shortness of breath) 10/10/2017   Coronary artery calcification 09/19/2016   Chest pain 02/03/2016   Elevated troponin 02/03/2016   Anxiety 02/03/2016   Essential hypertension 02/03/2016   Mixed hyperlipidemia 02/03/2016   Chest tightness 01/01/2016    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HCC)    Arthritis    Branch retinal artery occlusion, left eye    Chronic kidney disease    moderate   Fatigue    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 11/25/2017   Hyperkalemia    Hyperlipidemia    Hypertension    Pneumonia    at least 10 years ago   Pre-diabetes     Past Surgical History:  Procedure Laterality Date   COLONOSCOPY WITH PROPOFOL N/A 08/09/2016   Procedure: COLONOSCOPY WITH PROPOFOL;  Surgeon: Scot Jun, MD;  Location: [facility]  ENDOSCOPY;  Service: Endoscopy;  Laterality: N/A;   DILATATION & CURETTAGE/HYSTEROSCOPY WITH MYOSURE N/A 02/20/2018   Procedure: DILATATION & CURETTAGE/HYSTEROSCOPY WITH MYOSURE;  Surgeon: Schermerhorn, Ihor Austin, MD;  Location: [facility] ORS;  Service: Gynecology;  Laterality: N/A;   DILATION AND CURETTAGE OF UTERUS N/A 02/20/2018   Procedure: DILATATION AND CURETTAGE, fractional, resection of endometrial mass, possible myosure, hysteroscopy;  Surgeon: Schermerhorn, Ihor Austin, MD;  Location: [facility] ORS;  Service: Gynecology;  Laterality: N/A;   FEMORAL HERNIA REPAIR Right 11/26/2017   Procedure: HERNIA REPAIR FEMORAL with insertion or mesh;  Surgeon: Ancil Linsey, MD;  Location: [facility] ORS;  Service: General;  Laterality: Right;   HYSTEROSCOPY WITH D & C N/A 02/20/2018   Procedure: DILATATION AND CURETTAGE /HYSTEROSCOPY,;  Surgeon: Schermerhorn, Ihor Austin, MD;  Location: [facility] ORS;  Service: Gynecology;  Laterality: N/A;   TOTAL KNEE ARTHROPLASTY Left 08/11/2020   Procedure: TOTAL KNEE ARTHROPLASTY;  Surgeon: Ollen Gross, MD;  Location: [facility] ORS;  Service: Orthopedics;  Laterality: Left;    Prior to Admission medications   Medication Sig Start Date End Date Taking? Authorizing Provider  atorvastatin (LIPITOR) 20 MG tablet Take 20 mg by mouth every evening.    Yes [provider]  carvedilol (COREG) 6.25 MG tablet Take 6.25 mg by mouth 2 (two) times daily. 06/06/20  Yes [provider]  latanoprost (XALATAN) 0.005 % ophthalmic solution Place 1 drop into both eyes at bedtime.   Yes [provider]  losartan (COZAAR) 100 MG tablet Take 100 mg by mouth daily.   Yes [provider]  timolol (TIMOPTIC) 0.5 % ophthalmic solution Place 1 drop into both eyes in the morning. 06/26/20  Yes [provider]    Allergies  Allergen Reactions   Codeine Diarrhea and Nausea And Vomiting   Other Itching    Pt sensitive to adhesives to skin .     Social History    Socioeconomic History   Marital status: Divorced    Spouse name: Not on file   Number of children: Not on file   Years of education: Not on file   Highest education level: Not on file  Occupational History   Not on file  Tobacco Use   Smoking status: Never   Smokeless tobacco: Never  Vaping Use   Vaping status: Never Used  Substance and Sexual Activity   Alcohol use: No   Drug use: No   Sexual activity: Not on file  Other Topics Concern   Not on file  Social History Narrative   Not on file   Social Determinants of Health   Financial Resource Strain: Not on file  Food Insecurity: Not on file  Transportation Needs: Not on file  Physical Activity: Not on file  Stress: Not on file  Social Connections: Not on file  Intimate Partner Violence: Not on file    Tobacco Use: Low Risk  (08/05/2022)   Patient History    Smoking Tobacco Use: Never    Smokeless Tobacco Use: Never    Passive Exposure: Not on file  Recent Concern: Tobacco Use - Medium Risk (06/17/2022)   [facility]   Patient History    Smoking Tobacco Use: Never    Smokeless Tobacco Use: Never    Passive Exposure: Yes   Social History   Substance and Sexual Activity  Alcohol Use No    Family History  Problem Relation Age of Onset   Heart attack Father    Breast cancer Neg Hx     ROS  Objective:  Physical Exam: Alert and oriented. No acute distress.    Right Knee Exam:  Mild tenderness to palpation about the medial joint line of the right knee.  AROM 0-120 degrees.  No effusion noted.  No instability.  Mild patellofemoral crepitus.    Left Knee Exam:  Well-healed arthroplasty scar.  AROM 0 to 125 degrees  No swelling or effusion  Minimal AP play  No lateral instability with the knee in 30 degrees of flexion or full extension    Sensation and motor function intact in LE. Distal pulses 2+. Calves soft and nontender.      Radiographs:  Weightbearing AP bilateral knees and lateral of the right knee taken in office today and reviewed by myself Reveals bone-on-bone osteoarthritis in the medial compartment with varus alignment. Tricompartmental periarticular osteophytes are noted. Bone-on-bone osteoarthritis in patellofemoral compartment.  Assessment/Plan:  End stage arthritis, right knee   The patient history, physical examination, clinical judgment of the provider and imaging studies are consistent with end stage degenerative joint disease of the right knee and total knee arthroplasty is deemed medically necessary. The treatment options including medical management, injection therapy arthroscopy and arthroplasty were discussed at length. The risks and benefits of total knee arthroplasty were presented and reviewed. The risks due to aseptic loosening, infection, stiffness, patella tracking problems, thromboembolic complications and other imponderables were discussed. The patient acknowledged the explanation, agreed to proceed with the plan and consent was signed. Patient is being admitted for inpatient  treatment for surgery, pain control, PT, OT, prophylactic antibiotics, VTE prophylaxis, progressive ambulation and ADLs and discharge planning. The patient is planning to be discharged  home with OPPT scheduled .   Patient's anticipated LOS is less than 2 midnights, meeting these requirements: - Younger than 21 - Lives within 1 hour of care - Has a competent adult at home to recover with post-op recover - NO history of  - Chronic pain requiring opiods  - Diabetes  - Coronary Artery Disease  - Heart failure  - Heart attack  - Stroke  - DVT/VTE  - Cardiac arrhythmia  - Respiratory Failure/COPD  - Renal failure  - Anemia  - Advanced Liver disease    Therapy Plans: Aurelio Jew, PT at [facility] in [place] Disposition: Home with friend Planned DVT Prophylaxis: Eliquis 2.5mg  BID DME Needed: None PCP: Einar Crow, MD (clearance received) TXA: IV Allergies: codeine (diarrhea) Anesthesia Concerns: None BMI: 31.8 Last HgbA1c: 5.9% Pharmacy: [facility], 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 - Follow-up visit in 2 weeks with Dr. Lequita Halt - Begin physical therapy following surgery - Pre-operative lab work as pre-surgical testing - Prescriptions will be provided in hospital at time of discharge  Weston Brass, PA-C Orthopedic Surgery [facility] [place]

## 2022-08-10 ENCOUNTER — Ambulatory Visit: Payer: Medicare Other | Admitting: Physical Therapy

## 2022-08-10 ENCOUNTER — Encounter: Payer: Self-pay | Admitting: Physical Therapy

## 2022-08-10 DIAGNOSIS — R269 Unspecified abnormalities of gait and mobility: Secondary | ICD-10-CM

## 2022-08-10 DIAGNOSIS — M6281 Muscle weakness (generalized): Secondary | ICD-10-CM | POA: Diagnosis not present

## 2022-08-10 DIAGNOSIS — M25661 Stiffness of right knee, not elsewhere classified: Secondary | ICD-10-CM

## 2022-08-10 DIAGNOSIS — G8929 Other chronic pain: Secondary | ICD-10-CM

## 2022-08-10 NOTE — Therapy (Signed)
OUTPATIENT PHYSICAL THERAPY LOWER EXTREMITY TREATMENT Physical Therapy Progress Note   Dates of reporting period  06/29/22   to   08/10/22    Patient Name: Joanne Lewis MRN: [id number] DOB:March 21, 1945, 77 y.o., female Today's Date: 08/10/2022  END OF SESSION:  PT End of Session - 08/10/22 1249     Visit Number 10    Number of Visits 13    Date for PT Re-Evaluation 08/10/22    Authorization Type MEDICARE PART A AND B    Progress Note Due on Visit 10    PT Start Time 1255    PT Stop Time 1346    PT Time Calculation (min) 51 min    Activity Tolerance Patient tolerated treatment well    Behavior During Therapy WFL for tasks assessed/performed              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HCC)    Arthritis    Branch retinal artery occlusion, left eye    Chronic kidney disease    moderate   Fatigue    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 11/25/2017   Hyperkalemia    Hyperlipidemia    Hypertension    Pneumonia    at least 10 years ago   Pre-diabetes    Past Surgical History:  Procedure Laterality Date   COLONOSCOPY WITH PROPOFOL N/A 08/09/2016   Procedure: COLONOSCOPY WITH PROPOFOL;  Surgeon: Scot Jun, MD;  Location: [facility] ENDOSCOPY;  Service: Endoscopy;  Laterality: N/A;   DILATATION & CURETTAGE/HYSTEROSCOPY WITH MYOSURE N/A 02/20/2018   Procedure: DILATATION & CURETTAGE/HYSTEROSCOPY WITH MYOSURE;  Surgeon: Schermerhorn, Ihor Austin, MD;  Location: [facility] ORS;  Service: Gynecology;  Laterality: N/A;   DILATION AND CURETTAGE OF UTERUS N/A 02/20/2018   Procedure: DILATATION AND CURETTAGE, fractional, resection of endometrial mass, possible myosure, hysteroscopy;  Surgeon: Schermerhorn, Ihor Austin, MD;  Location: [facility] ORS;  Service: Gynecology;  Laterality: N/A;   FEMORAL HERNIA REPAIR Right 11/26/2017   Procedure: HERNIA REPAIR FEMORAL with insertion or mesh;  Surgeon: Ancil Linsey, MD;  Location: [facility]  ORS;  Service: General;  Laterality: Right;   HYSTEROSCOPY WITH D & C N/A 02/20/2018   Procedure: DILATATION AND CURETTAGE /HYSTEROSCOPY,;  Surgeon: Schermerhorn, Ihor Austin, MD;  Location: [facility] ORS;  Service: Gynecology;  Laterality: N/A;   TOTAL KNEE ARTHROPLASTY Left 08/11/2020   Procedure: TOTAL KNEE ARTHROPLASTY;  Surgeon: Ollen Gross, MD;  Location: [facility] ORS;  Service: Orthopedics;  Laterality: Left;   Patient Active Problem List   Diagnosis Date Noted   Osteoarthritis of knee 08/13/2020   OA (osteoarthritis) of knee 08/11/2020   Primary osteoarthritis of left knee 08/11/2020   SOB (shortness of breath) 10/10/2017   Coronary artery calcification 09/19/2016   Chest pain 02/03/2016   Elevated troponin 02/03/2016   Anxiety 02/03/2016   Essential hypertension 02/03/2016   Mixed hyperlipidemia 02/03/2016   Chest tightness 01/01/2016    PCP: Einar Crow, MD  REFERRING PROVIDER: Einar Crow, MD  REFERRING DIAG: [phone] (ICD-10-CM) - Osteoarthrosis, unspecified whether generalized or localized, lower leg M17.11 (ICD-10-CM) - Unilateral primary osteoarthritis, right knee   THERAPY DIAG:  Muscle weakness (generalized)  Gait difficulty  Knee joint stiffness, bilateral  Chronic pain of right knee  Rationale for Evaluation and Treatment: Rehabilitation  ONSET DATE: "years and years"   SUBJECTIVE:   SUBJECTIVE STATEMENT: Patient reports difficulty with standing for long periods of time and hurts a lot more with squatting  to garden or pick up objects. Feels that it is grinding when she is bending over. Unable to stand >10-15 minutes due to R knee pain. After L TKA, patient has more mobility but feels that she is overcompensating on L for the R knee pain. Patient reports tearing a muscle in her back late April/early May 2024 after pulling up a strong weed in her yard.   PERTINENT HISTORY: Planned R TKA on 08/16/22 by Dr. Trudee Grip. S/p L TKA on 08/11/2020 PAIN:  Are  you having pain? Yes: NPRS scale: 4/10 Pain location: R knee pain  Pain description: dull ache Aggravating factors: standing, squatting Relieving factors: sitting   PRECAUTIONS: Fall  WEIGHT BEARING RESTRICTIONS: No  FALLS:  Has patient fallen in last 6 months? No  LIVING ENVIRONMENT: Lives with: lives alone Lives in: House/apartment Stairs: Yes: Internal: 3 steps; none Has following equipment at home: Single point cane and Walker - 2 wheeled  OCCUPATION: retired   PLOF: Independent  PATIENT GOALS: be more mobile, walk with less pain, stand longer and have more stamina   NEXT MD VISIT: 07/2022  OBJECTIVE:   DIAGNOSTIC FINDINGS: N/A  PATIENT SURVEYS:  FOTO 50  COGNITION: Overall cognitive status: Within functional limits for tasks assessed     SENSATION: WFL  POSTURE: rounded shoulders and forward head  PALPATION: No TTP  LOWER EXTREMITY ROM:  Active ROM Right eval Left eval  Hip flexion    Hip extension    Hip abduction    Hip adduction    Hip internal rotation    Hip external rotation    Knee flexion    Knee extension    Ankle dorsiflexion    Ankle plantarflexion    Ankle inversion    Ankle eversion     (Blank rows = not tested)  LOWER EXTREMITY MMT:  MMT Right eval Left eval  Hip flexion 4 4+  Hip extension    Hip abduction 4 4+  Hip adduction    Hip internal rotation    Hip external rotation    Knee flexion 4+ 4+  Knee extension 4+ 4+  Ankle dorsiflexion    Ankle plantarflexion    Ankle inversion    Ankle eversion     (Blank rows = not tested)  FUNCTIONAL TESTS:  5 times sit to stand: 12.5 seconds 6 minute walk test: 1,085'   GAIT: Distance walked: 26' Assistive device utilized: None Level of assistance: Modified independence Comments: R lateral lean, decreased stance time on R, decreased DF on R   TODAY'S TREATMENT:                                                                                                                               DATE: 08/10/22  Subjective: Patient reports no new complaints.  Pt. Remains compliant with HEP and is scheduled for TKE on Monday.    Treatment:  Nustep level 5 (seat 10, arms 9) x  10 minutes to improve flexibility and endurance.   Discussed weekend.        4# ankle wts.: 6" step ups on L/R 20x each (forward/lateral).    4# AW walking around PT gym with consistent heel strike/ toe off.  Standing hamstring curls/ hip abduction/ heel raises/ partial lunges 20x each.  Light UE assist at elevated mat table.    Seated 4# AW:  LAQ/ marching 20x each LE.        Mini squats with chair feedback 20x with no UE support (cuing to keep knees behind toes).  Walking marching with 4# AW @ agility ladder with no UE assist. Focus on hip/knee flexion.  Added alt. UE/LE touches 4 laps.     Not today Step taps 12" step 2 x 15 with no UE support Fwd step ups 6" step 2 x 15 with no UE support, leading with each LE  TRX mini lunge 2 x 10 each LE with B UE support   PATIENT EDUCATION:  Education details: HEP, POC, goals Person educated: Patient Education method: Explanation, Demonstration, and Handouts Education comprehension: verbalized understanding and returned demonstration  HOME EXERCISE PROGRAM: Access Code: [email or web address] URL: [email or web address] Date: 06/29/2022 Prepared by: Maylon Peppers  Exercises - Standing Hip Abduction  - 2-3 x daily - 5-7 x weekly - 3 sets - 10 reps - Standing Hip Extension  - 2-3 x daily - 5-7 x weekly - 3 sets - 10 reps - Standing Hip Flexion AROM  - 2-3 x daily - 5-7 x weekly - 3 sets - 10 reps - Standing Marching  - 2-3 x daily - 5-7 x weekly - 3 sets - 10 reps  Access Code: [email or web address] URL: [email or web address] Date: 07/06/2022 Prepared by: Maylon Peppers  Exercises - Standing Hip Flexion with Resistance Loop  - 2-3 x daily - 5-7 x weekly - 3 sets - 10 reps - Hip Abduction with Resistance Loop  - 2-3 x daily - 5-7 x  weekly - 3 sets - 10 reps - Hip Extension with Resistance Loop  - 2-3 x daily - 5-7 x weekly - 3 sets - 10 reps  ASSESSMENT:  CLINICAL IMPRESSION:    Patient continues to remain motivated to work hard during therapy session. Session focused on B LE strengthening dynamically. Continues to demonstrate R LE weakness with single leg activities and R knee joint stiffness. Patient will continue to benefit from skilled therapy to address remaining deficits in order to improve quality of life and LE strength in preparation for R TKA in August.   OBJECTIVE IMPAIRMENTS: Abnormal gait, decreased activity tolerance, decreased balance, decreased endurance, decreased mobility, difficulty walking, decreased ROM, decreased strength, impaired flexibility, improper body mechanics, postural dysfunction, and pain.   ACTIVITY LIMITATIONS: standing, squatting, stairs, and locomotion level  PARTICIPATION LIMITATIONS: cleaning, laundry, shopping, community activity, and yard work  PERSONAL FACTORS: Age, Fitness, Past/current experiences, and Time since onset of injury/illness/exacerbation are also affecting patient's functional outcome.   REHAB POTENTIAL: Fair    CLINICAL DECISION MAKING: Stable/uncomplicated  EVALUATION COMPLEXITY: Low   GOALS: Goals reviewed with patient? Yes  SHORT TERM GOALS: Target date: 07/27/2022  Patient will be independent in HEP to improve strength/mobility for better functional independence with ADLs. Baseline: Goal status: Goal met   LONG TERM GOALS: Target date: 08/10/2022  Patient will increase FOTO score to equal to or greater than  63 to demonstrate statistically significant improvement in mobility and quality of life.  Baseline: 6/18: 50/100 Goal status: Ongoing  2.  Patient will increase BLE gross strength to 4+/5 as to improve functional strength for independent gait, increased standing tolerance and increased ADL ability. Baseline: 6/18: see above  Goal status:  Partially met  3.  Patient will increase six minute walk test distance to >1300' for progression to community ambulator and improve gait ability Baseline: 6/18: 1085' Goal status: Ongoing  4.  Patient will be able to tolerate >20 minutes of standing to be able to complete household and community activities to show improvement of mobility.  Baseline: 6/18: unable to tolerate 10-15 mins Goal status: Goal met  PLAN:  PT FREQUENCY: 1-2x/week  PT DURATION: 6 weeks  PLANNED INTERVENTIONS: Therapeutic exercises, Therapeutic activity, Neuromuscular re-education, Balance training, Gait training, Patient/Family education, Self Care, Joint mobilization, Stair training, DME instructions, Spinal mobilization, Cryotherapy, Moist heat, and Manual therapy  PLAN FOR NEXT SESSION: CHECK FOTO/discharge next tx. Prior to TKA.   Cammie Mcgee, PT, DPT # [id number] Physical Therapist - [facility]  08/10/2022, 1:54 PM

## 2022-08-12 ENCOUNTER — Encounter: Payer: Self-pay | Admitting: Physical Therapy

## 2022-08-12 ENCOUNTER — Ambulatory Visit: Payer: Medicare Other | Attending: Internal Medicine | Admitting: Physical Therapy

## 2022-08-12 DIAGNOSIS — R269 Unspecified abnormalities of gait and mobility: Secondary | ICD-10-CM | POA: Diagnosis present

## 2022-08-12 DIAGNOSIS — M25662 Stiffness of left knee, not elsewhere classified: Secondary | ICD-10-CM | POA: Insufficient documentation

## 2022-08-12 DIAGNOSIS — M6281 Muscle weakness (generalized): Secondary | ICD-10-CM | POA: Diagnosis present

## 2022-08-12 DIAGNOSIS — M25561 Pain in right knee: Secondary | ICD-10-CM | POA: Diagnosis present

## 2022-08-12 DIAGNOSIS — G8929 Other chronic pain: Secondary | ICD-10-CM | POA: Diagnosis present

## 2022-08-12 DIAGNOSIS — Z96651 Presence of right artificial knee joint: Secondary | ICD-10-CM | POA: Diagnosis present

## 2022-08-12 DIAGNOSIS — M25661 Stiffness of right knee, not elsewhere classified: Secondary | ICD-10-CM | POA: Diagnosis present

## 2022-08-12 NOTE — Therapy (Signed)
OUTPATIENT PHYSICAL THERAPY LOWER EXTREMITY TREATMENT/ RECERTIFICATION   Patient Name: Joanne Lewis MRN: [id number] DOB:[birth year], 77 y.o., female Today's Date: 08/13/2022  END OF SESSION:  PT End of Session - 08/12/22 1233     Visit Number 11    Number of Visits 11    Date for PT Re-Evaluation 08/12/22    Authorization Type MEDICARE PART A AND B    Progress Note Due on Visit 10    PT Start Time 1258    PT Stop Time 1345    PT Time Calculation (min) 47 min    Activity Tolerance Patient tolerated treatment well    Behavior During Therapy WFL for tasks assessed/performed              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HCC)    Arthritis    Branch retinal artery occlusion, left eye    Chronic kidney disease    moderate   Fatigue    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 11/25/2017   Hyperkalemia    Hyperlipidemia    Hypertension    Pneumonia    at least 10 years ago   Pre-diabetes    Past Surgical History:  Procedure Laterality Date   COLONOSCOPY WITH PROPOFOL N/A 08/09/2016   Procedure: COLONOSCOPY WITH PROPOFOL;  Surgeon: Scot Jun, MD;  Location: [facility] ENDOSCOPY;  Service: Endoscopy;  Laterality: N/A;   DILATATION & CURETTAGE/HYSTEROSCOPY WITH MYOSURE N/A 02/20/2018   Procedure: DILATATION & CURETTAGE/HYSTEROSCOPY WITH MYOSURE;  Surgeon: Schermerhorn, Ihor Austin, MD;  Location: [facility] ORS;  Service: Gynecology;  Laterality: N/A;   DILATION AND CURETTAGE OF UTERUS N/A 02/20/2018   Procedure: DILATATION AND CURETTAGE, fractional, resection of endometrial mass, possible myosure, hysteroscopy;  Surgeon: Schermerhorn, Ihor Austin, MD;  Location: [facility] ORS;  Service: Gynecology;  Laterality: N/A;   FEMORAL HERNIA REPAIR Right 11/26/2017   Procedure: HERNIA REPAIR FEMORAL with insertion or mesh;  Surgeon: Ancil Linsey, MD;  Location: [facility] ORS;  Service: General;  Laterality: Right;   HYSTEROSCOPY WITH D & C  N/A 02/20/2018   Procedure: DILATATION AND CURETTAGE /HYSTEROSCOPY,;  Surgeon: Schermerhorn, Ihor Austin, MD;  Location: [facility] ORS;  Service: Gynecology;  Laterality: N/A;   TOTAL KNEE ARTHROPLASTY Left 08/11/2020   Procedure: TOTAL KNEE ARTHROPLASTY;  Surgeon: Ollen Gross, MD;  Location: [facility] ORS;  Service: Orthopedics;  Laterality: Left;   Patient Active Problem List   Diagnosis Date Noted   Osteoarthritis of knee 08/13/2020   OA (osteoarthritis) of knee 08/11/2020   Primary osteoarthritis of left knee 08/11/2020   SOB (shortness of breath) 10/10/2017   Coronary artery calcification 09/19/2016   Chest pain 02/03/2016   Elevated troponin 02/03/2016   Anxiety 02/03/2016   Essential hypertension 02/03/2016   Mixed hyperlipidemia 02/03/2016   Chest tightness 01/01/2016    PCP: Einar Crow, MD  REFERRING PROVIDER: Einar Crow, MD  REFERRING DIAG: [phone] (ICD-10-CM) - Osteoarthrosis, unspecified whether generalized or localized, lower leg M17.11 (ICD-10-CM) - Unilateral primary osteoarthritis, right knee   THERAPY DIAG:  Muscle weakness (generalized)  Gait difficulty  Knee joint stiffness, bilateral  Chronic pain of right knee  Rationale for Evaluation and Treatment: Rehabilitation  ONSET DATE: "years and years"   SUBJECTIVE:   SUBJECTIVE STATEMENT: Patient reports difficulty with standing for long periods of time and hurts a lot more with squatting to garden or pick up objects. Feels that it is grinding when she is bending over. Unable to  stand >10-15 minutes due to R knee pain. After L TKA, patient has more mobility but feels that she is overcompensating on L for the R knee pain. Patient reports tearing a muscle in her back late April/early May 2024 after pulling up a strong weed in her yard.   PERTINENT HISTORY: Planned R TKA on 08/16/22 by Dr. Trudee Grip. S/p L TKA on 08/11/2020 PAIN:  Are you having pain? Yes: NPRS scale: 4/10 Pain location: R knee pain   Pain description: dull ache Aggravating factors: standing, squatting Relieving factors: sitting   PRECAUTIONS: Fall  WEIGHT BEARING RESTRICTIONS: No  FALLS:  Has patient fallen in last 6 months? No  LIVING ENVIRONMENT: Lives with: lives alone Lives in: House/apartment Stairs: Yes: Internal: 3 steps; none Has following equipment at home: Single point cane and Walker - 2 wheeled  OCCUPATION: retired   PLOF: Independent  PATIENT GOALS: be more mobile, walk with less pain, stand longer and have more stamina   NEXT MD VISIT: 07/2022  OBJECTIVE:   DIAGNOSTIC FINDINGS: N/A  PATIENT SURVEYS:  FOTO 50  COGNITION: Overall cognitive status: Within functional limits for tasks assessed     SENSATION: WFL  POSTURE: rounded shoulders and forward head  PALPATION: No TTP  LOWER EXTREMITY ROM:  Active ROM Right eval Left eval  Hip flexion    Hip extension    Hip abduction    Hip adduction    Hip internal rotation    Hip external rotation    Knee flexion    Knee extension    Ankle dorsiflexion    Ankle plantarflexion    Ankle inversion    Ankle eversion     (Blank rows = not tested)  LOWER EXTREMITY MMT:  MMT Right eval Left eval  Hip flexion 4 4+  Hip extension    Hip abduction 4 4+  Hip adduction    Hip internal rotation    Hip external rotation    Knee flexion 4+ 4+  Knee extension 4+ 4+  Ankle dorsiflexion    Ankle plantarflexion    Ankle inversion    Ankle eversion     (Blank rows = not tested)  FUNCTIONAL TESTS:  5 times sit to stand: 12.5 seconds 6 minute walk test: 1,085'   GAIT: Distance walked: 75' Assistive device utilized: None Level of assistance: Modified independence Comments: R lateral lean, decreased stance time on R, decreased DF on R   TODAY'S TREATMENT:                                                                                                                              DATE: 08/13/22  Subjective: Patient reports no  new complaints.  Pt. Remains compliant with HEP and is scheduled for TKE on Monday.    Treatment:  Nustep level 5 (seat 9-10, arms 9)- 0.54 miles x 10 minutes to improve flexibility and endurance.   Discussed upcoming TKA.  4# ankle wts.: 6" step ups on L/R 20x each (forward/lateral).    4# ankle wts.: walking cone taps (forward/ lateral) at agility with no UE assist (SBA/CGA for safety).    4# AW walking around PT gym with consistent heel strike/ toe off.  Standing hamstring curls/ hip abduction/ heel raises 20x each.  Light UE assist at stairs.  Sit to stands 10x2 (no UE assist). Lateral walking in gym with GTB.        Seated 4# AW: LAQ/ marching 20x each LE.  Sit to stands with added 6" step under R foot, then L foot (15x each).  Cuing to slow down/ upright posture.    Not today Step taps 12" step 2 x 15 with no UE support Fwd step ups 6" step 2 x 15 with no UE support, leading with each LE  TRX mini lunge 2 x 10 each LE with B UE support   PATIENT EDUCATION:  Education details: HEP, POC, goals Person educated: Patient Education method: Explanation, Demonstration, and Handouts Education comprehension: verbalized understanding and returned demonstration  HOME EXERCISE PROGRAM: Access Code: [email or web address] URL: [email or web address] Date: 06/29/2022 Prepared by: Maylon Peppers  Exercises - Standing Hip Abduction  - 2-3 x daily - 5-7 x weekly - 3 sets - 10 reps - Standing Hip Extension  - 2-3 x daily - 5-7 x weekly - 3 sets - 10 reps - Standing Hip Flexion AROM  - 2-3 x daily - 5-7 x weekly - 3 sets - 10 reps - Standing Marching  - 2-3 x daily - 5-7 x weekly - 3 sets - 10 reps  Access Code: [email or web address] URL: [email or web address] Date: 07/06/2022 Prepared by: Maylon Peppers  Exercises - Standing Hip Flexion with Resistance Loop  - 2-3 x daily - 5-7 x weekly - 3 sets - 10 reps - Hip Abduction with Resistance Loop  - 2-3 x daily - 5-7 x weekly - 3 sets  - 10 reps - Hip Extension with Resistance Loop  - 2-3 x daily - 5-7 x weekly - 3 sets - 10 reps  ASSESSMENT:  CLINICAL IMPRESSION:    Patient continues to remain motivated to work hard during therapy session. Session focused on B LE strengthening dynamically. Continues to demonstrate R LE weakness with single leg activities and R knee joint stiffness. Patient will continue to benefit from skilled therapy to address remaining deficits in order to improve quality of life and LE strength in preparation for R TKA in August.   OBJECTIVE IMPAIRMENTS: Abnormal gait, decreased activity tolerance, decreased balance, decreased endurance, decreased mobility, difficulty walking, decreased ROM, decreased strength, impaired flexibility, improper body mechanics, postural dysfunction, and pain.   ACTIVITY LIMITATIONS: standing, squatting, stairs, and locomotion level  PARTICIPATION LIMITATIONS: cleaning, laundry, shopping, community activity, and yard work  PERSONAL FACTORS: Age, Fitness, Past/current experiences, and Time since onset of injury/illness/exacerbation are also affecting patient's functional outcome.   REHAB POTENTIAL: Fair    CLINICAL DECISION MAKING: Stable/uncomplicated  EVALUATION COMPLEXITY: Low   GOALS: Goals reviewed with patient? Yes   LONG TERM GOALS: Target date: 08/12/2022  Patient will increase FOTO score to equal to or greater than  63 to demonstrate statistically significant improvement in mobility and quality of life.  Baseline: 6/18: 50/100.  8/1: 71 Goal status: Goal met  2.  Patient will increase BLE gross strength to 4+/5 as to improve functional strength for independent gait, increased standing tolerance and increased ADL ability. Baseline: 6/18: see above  Goal status: Partially met  3.  Patient will increase six minute walk test distance to >1300' for progression to community ambulator and improve gait ability Baseline: 6/18: 1085' Goal status: Unable to  reassess  4.  Patient will be able to tolerate >20 minutes of standing to be able to complete household and community activities to show improvement of mobility.  Baseline: 6/18: unable to tolerate 10-15 mins Goal status: Goal met  PLAN:  PT FREQUENCY: 1x  PLANNED INTERVENTIONS: Therapeutic exercises, Therapeutic activity, Neuromuscular re-education, Balance training, Gait training, Patient/Family education, Self Care, Joint mobilization, Stair training, DME instructions, Spinal mobilization, Cryotherapy, Moist heat, and Manual therapy  PLAN FOR NEXT SESSION: Discharge from PT.  Pt. Will return to PT after TKA.     Cammie Mcgee, PT, DPT # [phone] Physical Therapist - [facility]  08/13/2022, 7:38 PM

## 2022-08-15 NOTE — Anesthesia Preprocedure Evaluation (Signed)
Anesthesia Evaluation  Patient identified by MRN, date of birth, ID band Patient awake    Reviewed: Allergy & Precautions, NPO status , Patient's Chart, lab work & pertinent test results  Airway Mallampati: II  TM Distance: >3 FB Neck ROM: Full    Dental no notable dental hx. (+) Teeth Intact, Dental Advisory Given   Pulmonary neg pulmonary ROS   Pulmonary exam normal breath sounds clear to auscultation       Cardiovascular hypertension, Pt. on medications and Pt. on home beta blockers Normal cardiovascular exam Rhythm:Regular Rate:Normal     Neuro/Psych   Anxiety     negative neurological ROS     GI/Hepatic   Endo/Other    Renal/GU Renal InsufficiencyRenal diseaseLab Results      Component                Value               Date                             K                        4.1                 08/03/2022                         CREATININE               0.93                08/03/2022                    Musculoskeletal  (+) Arthritis , Osteoarthritis,    Abdominal   Peds  Hematology Lab Results      Component                Value               Date                          HGB                      13.1                08/03/2022                HCT                      40.8                08/03/2022                 PLT                      204                 08/03/2022              Anesthesia Other Findings All: codeine  Reproductive/Obstetrics                              Anesthesia Physical Anesthesia Plan  ASA: 3  Anesthesia Plan: Spinal and Regional  Post-op Pain Management: Toradol IV (intra-op)*   Induction:   PONV Risk Score and Plan: 3 and Treatment may vary due to age or medical condition, Midazolam, Propofol infusion and Ondansetron  Airway Management Planned: Nasal Cannula and Natural Airway  Additional Equipment: None  Intra-op Plan:   Post-operative  Plan: Extubation in OR  Informed Consent: I have reviewed the patients History and Physical, chart, labs and discussed the procedure including the risks, benefits and alternatives for the proposed anesthesia with the patient or authorized representative who has indicated his/her understanding and acceptance.     Dental advisory given  Plan Discussed with:   Anesthesia Plan Comments: (Sp w R Adductor)         Anesthesia Quick Evaluation

## 2022-08-16 ENCOUNTER — Ambulatory Visit (HOSPITAL_BASED_OUTPATIENT_CLINIC_OR_DEPARTMENT_OTHER): Payer: Medicare Other | Admitting: Anesthesiology

## 2022-08-16 ENCOUNTER — Other Ambulatory Visit: Payer: Self-pay

## 2022-08-16 ENCOUNTER — Observation Stay (HOSPITAL_COMMUNITY)
Admission: RE | Admit: 2022-08-16 | Discharge: 2022-08-17 | Disposition: A | Discharge status: Home / Self Care | Payer: Medicare Other | Source: Ambulatory Visit | Attending: Orthopedic Surgery | Admitting: Orthopedic Surgery

## 2022-08-16 ENCOUNTER — Encounter (HOSPITAL_COMMUNITY): Payer: Self-pay | Admitting: Orthopedic Surgery

## 2022-08-16 ENCOUNTER — Ambulatory Visit (HOSPITAL_COMMUNITY): Payer: Medicare Other | Admitting: Physician Assistant

## 2022-08-16 ENCOUNTER — Encounter (HOSPITAL_COMMUNITY)
Admission: RE | Disposition: A | Discharge status: Home / Self Care | Payer: Self-pay | Source: Ambulatory Visit | Attending: Orthopedic Surgery

## 2022-08-16 DIAGNOSIS — Z96652 Presence of left artificial knee joint: Secondary | ICD-10-CM | POA: Insufficient documentation

## 2022-08-16 DIAGNOSIS — F419 Anxiety disorder, unspecified: Secondary | ICD-10-CM | POA: Diagnosis not present

## 2022-08-16 DIAGNOSIS — I10 Essential (primary) hypertension: Secondary | ICD-10-CM | POA: Diagnosis not present

## 2022-08-16 DIAGNOSIS — N189 Chronic kidney disease, unspecified: Secondary | ICD-10-CM | POA: Diagnosis not present

## 2022-08-16 DIAGNOSIS — Z79899 Other long term (current) drug therapy: Secondary | ICD-10-CM | POA: Insufficient documentation

## 2022-08-16 DIAGNOSIS — E782 Mixed hyperlipidemia: Secondary | ICD-10-CM

## 2022-08-16 DIAGNOSIS — M1711 Unilateral primary osteoarthritis, right knee: Secondary | ICD-10-CM | POA: Diagnosis not present

## 2022-08-16 DIAGNOSIS — I129 Hypertensive chronic kidney disease with stage 1 through stage 4 chronic kidney disease, or unspecified chronic kidney disease: Secondary | ICD-10-CM | POA: Diagnosis not present

## 2022-08-16 DIAGNOSIS — M179 Osteoarthritis of knee, unspecified: Principal | ICD-10-CM | POA: Diagnosis present

## 2022-08-16 HISTORY — PX: TOTAL KNEE ARTHROPLASTY: SHX125

## 2022-08-16 SURGERY — ARTHROPLASTY, KNEE, TOTAL
Anesthesia: Regional | Site: Knee | Laterality: Right

## 2022-08-16 MED ORDER — ONDANSETRON HCL 4 MG PO TABS: 4.0000 mg | ORAL_TABLET | Freq: Four times a day (QID) | ORAL | Status: DC | PRN

## 2022-08-16 MED ORDER — HYDROMORPHONE HCL 2 MG PO TABS: 2.0000 mg | ORAL_TABLET | ORAL | Status: DC | PRN

## 2022-08-16 MED ORDER — TRANEXAMIC ACID-NACL 1000-0.7 MG/100ML-% IV SOLN
1000.0000 mg | INTRAVENOUS | Status: AC
  Administered 2022-08-16: 1000 mg via INTRAVENOUS
  Filled 2022-08-16: qty 100

## 2022-08-16 MED ORDER — FENTANYL CITRATE PF 50 MCG/ML IJ SOSY
100.0000 ug | PREFILLED_SYRINGE | Freq: Once | INTRAMUSCULAR | Status: AC
  Administered 2022-08-16: 50 ug via INTRAVENOUS
  Filled 2022-08-16: qty 2

## 2022-08-16 MED ORDER — ONDANSETRON HCL 4 MG/2ML IJ SOLN
INTRAMUSCULAR | Status: DC | PRN
Start: 2022-08-16 — End: 2022-08-16
  Administered 2022-08-16: 4 mg via INTRAVENOUS

## 2022-08-16 MED ORDER — LACTATED RINGERS IV SOLN: INTRAVENOUS | Status: DC

## 2022-08-16 MED ORDER — APIXABAN 2.5 MG PO TABS
2.5000 mg | ORAL_TABLET | Freq: Two times a day (BID) | ORAL | Status: DC
  Administered 2022-08-17: 2.5 mg via ORAL
  Filled 2022-08-16: qty 1

## 2022-08-16 MED ORDER — LATANOPROST 0.005 % OP SOLN
1.0000 [drp] | Freq: Every day | OPHTHALMIC | Status: DC
  Administered 2022-08-16: 1 [drp] via OPHTHALMIC
  Filled 2022-08-16: qty 2.5

## 2022-08-16 MED ORDER — HYDROMORPHONE HCL 2 MG PO TABS: 1.0000 mg | ORAL_TABLET | ORAL | Status: DC | PRN

## 2022-08-16 MED ORDER — BUPIVACAINE IN DEXTROSE 0.75-8.25 % IT SOLN
INTRATHECAL | Status: DC | PRN
  Administered 2022-08-16: 1.6 mL via INTRATHECAL

## 2022-08-16 MED ORDER — ACETAMINOPHEN 500 MG PO TABS
1000.0000 mg | ORAL_TABLET | Freq: Four times a day (QID) | ORAL | Status: AC
  Administered 2022-08-16 – 2022-08-17 (×3): 1000 mg via ORAL
  Filled 2022-08-16 (×3): qty 2

## 2022-08-16 MED ORDER — ONDANSETRON HCL 4 MG/2ML IJ SOLN: 4.0000 mg | Freq: Four times a day (QID) | INTRAMUSCULAR | Status: DC | PRN

## 2022-08-16 MED ORDER — METHOCARBAMOL 500 MG PO TABS
500.0000 mg | ORAL_TABLET | Freq: Four times a day (QID) | ORAL | Status: DC | PRN
  Administered 2022-08-16 (×2): 500 mg via ORAL
  Filled 2022-08-16 (×3): qty 1

## 2022-08-16 MED ORDER — CARVEDILOL 6.25 MG PO TABS
6.2500 mg | ORAL_TABLET | Freq: Two times a day (BID) | ORAL | Status: DC
  Filled 2022-08-16: qty 1

## 2022-08-16 MED ORDER — SODIUM CHLORIDE 0.9 % IV SOLN: INTRAVENOUS | Status: DC

## 2022-08-16 MED ORDER — ROPIVACAINE HCL 5 MG/ML IJ SOLN
INTRAMUSCULAR | Status: DC | PRN
  Administered 2022-08-16: 30 mL via PERINEURAL

## 2022-08-16 MED ORDER — PHENYLEPHRINE HCL-NACL 20-0.9 MG/250ML-% IV SOLN
INTRAVENOUS | Status: DC | PRN
Start: 2022-08-16 — End: 2022-08-16
  Administered 2022-08-16: 30 ug/min via INTRAVENOUS

## 2022-08-16 MED ORDER — STERILE WATER FOR IRRIGATION IR SOLN
Status: DC | PRN
  Administered 2022-08-16: 2000 mL

## 2022-08-16 MED ORDER — PHENOL 1.4 % MT LIQD: 1.0000 | OROMUCOSAL | Status: DC | PRN

## 2022-08-16 MED ORDER — CLONIDINE HCL (ANALGESIA) 100 MCG/ML EP SOLN
EPIDURAL | Status: DC | PRN
  Administered 2022-08-16: 100 ug

## 2022-08-16 MED ORDER — SODIUM CHLORIDE (PF) 0.9 % IJ SOLN
INTRAMUSCULAR | Status: AC
  Filled 2022-08-16: qty 50

## 2022-08-16 MED ORDER — METOCLOPRAMIDE HCL 5 MG/ML IJ SOLN: 5.0000 mg | Freq: Three times a day (TID) | INTRAMUSCULAR | Status: DC | PRN

## 2022-08-16 MED ORDER — TIMOLOL MALEATE 0.5 % OP SOLN
1.0000 [drp] | Freq: Every day | OPHTHALMIC | Status: DC
  Administered 2022-08-17: 1 [drp] via OPHTHALMIC
  Filled 2022-08-16: qty 5

## 2022-08-16 MED ORDER — LOSARTAN POTASSIUM 50 MG PO TABS
100.0000 mg | ORAL_TABLET | Freq: Every day | ORAL | Status: DC
  Filled 2022-08-16: qty 2

## 2022-08-16 MED ORDER — SODIUM CHLORIDE 0.9 % IR SOLN
Status: DC | PRN
  Administered 2022-08-16: 1000 mL

## 2022-08-16 MED ORDER — CEFAZOLIN SODIUM-DEXTROSE 2-4 GM/100ML-% IV SOLN
2.0000 g | Freq: Four times a day (QID) | INTRAVENOUS | Status: AC
  Administered 2022-08-16 (×2): 2 g via INTRAVENOUS
  Filled 2022-08-16 (×2): qty 100

## 2022-08-16 MED ORDER — PROPOFOL 500 MG/50ML IV EMUL
INTRAVENOUS | Status: AC
  Filled 2022-08-16: qty 50

## 2022-08-16 MED ORDER — DEXAMETHASONE SODIUM PHOSPHATE 10 MG/ML IJ SOLN
INTRAMUSCULAR | Status: AC
  Filled 2022-08-16: qty 1

## 2022-08-16 MED ORDER — DOCUSATE SODIUM 100 MG PO CAPS
100.0000 mg | ORAL_CAPSULE | Freq: Two times a day (BID) | ORAL | Status: DC
  Administered 2022-08-16 – 2022-08-17 (×3): 100 mg via ORAL
  Filled 2022-08-16 (×3): qty 1

## 2022-08-16 MED ORDER — BUPIVACAINE LIPOSOME 1.3 % IJ SUSP
INTRAMUSCULAR | Status: DC | PRN
  Administered 2022-08-16: 20 mL

## 2022-08-16 MED ORDER — 0.9 % SODIUM CHLORIDE (POUR BTL) OPTIME
TOPICAL | Status: DC | PRN
  Administered 2022-08-16: 1000 mL

## 2022-08-16 MED ORDER — POVIDONE-IODINE 10 % EX SWAB: 2.0000 | Freq: Once | CUTANEOUS | Status: DC

## 2022-08-16 MED ORDER — ACETAMINOPHEN 10 MG/ML IV SOLN
1000.0000 mg | Freq: Four times a day (QID) | INTRAVENOUS | Status: DC
  Administered 2022-08-16: 1000 mg via INTRAVENOUS
  Filled 2022-08-16: qty 100

## 2022-08-16 MED ORDER — CEFAZOLIN SODIUM-DEXTROSE 2-4 GM/100ML-% IV SOLN
2.0000 g | INTRAVENOUS | Status: AC
  Administered 2022-08-16: 2 g via INTRAVENOUS
  Filled 2022-08-16: qty 100

## 2022-08-16 MED ORDER — POLYETHYLENE GLYCOL 3350 17 G PO PACK: 17.0000 g | PACK | Freq: Every day | ORAL | Status: DC | PRN

## 2022-08-16 MED ORDER — PROPOFOL 1000 MG/100ML IV EMUL
INTRAVENOUS | Status: AC
  Filled 2022-08-16: qty 100

## 2022-08-16 MED ORDER — MIDAZOLAM HCL 2 MG/2ML IJ SOLN
2.0000 mg | Freq: Once | INTRAMUSCULAR | Status: DC
  Filled 2022-08-16: qty 2

## 2022-08-16 MED ORDER — CHLORHEXIDINE GLUCONATE 0.12 % MT SOLN
15.0000 mL | Freq: Once | OROMUCOSAL | Status: AC
  Administered 2022-08-16: 15 mL via OROMUCOSAL

## 2022-08-16 MED ORDER — ORAL CARE MOUTH RINSE: 15.0000 mL | Freq: Once | OROMUCOSAL | Status: AC

## 2022-08-16 MED ORDER — BISACODYL 10 MG RE SUPP: 10.0000 mg | Freq: Every day | RECTAL | Status: DC | PRN

## 2022-08-16 MED ORDER — HYDROMORPHONE HCL 1 MG/ML IJ SOLN
0.5000 mg | INTRAMUSCULAR | Status: DC | PRN
  Administered 2022-08-16: 1 mg via INTRAVENOUS
  Filled 2022-08-16: qty 1

## 2022-08-16 MED ORDER — DEXAMETHASONE SODIUM PHOSPHATE 10 MG/ML IJ SOLN
8.0000 mg | Freq: Once | INTRAMUSCULAR | Status: AC
  Administered 2022-08-16: 8 mg via INTRAVENOUS

## 2022-08-16 MED ORDER — METOCLOPRAMIDE HCL 5 MG PO TABS: 5.0000 mg | ORAL_TABLET | Freq: Three times a day (TID) | ORAL | Status: DC | PRN

## 2022-08-16 MED ORDER — TRAMADOL HCL 50 MG PO TABS
50.0000 mg | ORAL_TABLET | Freq: Four times a day (QID) | ORAL | Status: DC | PRN
  Administered 2022-08-16: 100 mg via ORAL
  Administered 2022-08-17: 50 mg via ORAL
  Filled 2022-08-16 (×2): qty 2

## 2022-08-16 MED ORDER — METHOCARBAMOL 500 MG IVPB - SIMPLE MED: 500.0000 mg | Freq: Four times a day (QID) | INTRAVENOUS | Status: DC | PRN

## 2022-08-16 MED ORDER — DIPHENHYDRAMINE HCL 12.5 MG/5ML PO ELIX: 12.5000 mg | ORAL_SOLUTION | ORAL | Status: DC | PRN

## 2022-08-16 MED ORDER — PROPOFOL 500 MG/50ML IV EMUL
INTRAVENOUS | Status: DC | PRN
  Administered 2022-08-16: 80 ug/kg/min via INTRAVENOUS

## 2022-08-16 MED ORDER — SODIUM CHLORIDE (PF) 0.9 % IJ SOLN
INTRAMUSCULAR | Status: AC
  Filled 2022-08-16: qty 10

## 2022-08-16 MED ORDER — FLEET ENEMA 7-19 GM/118ML RE ENEM: 1.0000 | ENEMA | Freq: Once | RECTAL | Status: DC | PRN

## 2022-08-16 MED ORDER — BUPIVACAINE LIPOSOME 1.3 % IJ SUSP: 20.0000 mL | Freq: Once | INTRAMUSCULAR | Status: DC

## 2022-08-16 MED ORDER — MENTHOL 3 MG MT LOZG: 1.0000 | LOZENGE | OROMUCOSAL | Status: DC | PRN

## 2022-08-16 MED ORDER — PHENYLEPHRINE HCL-NACL 20-0.9 MG/250ML-% IV SOLN
INTRAVENOUS | Status: AC
  Filled 2022-08-16: qty 250

## 2022-08-16 MED ORDER — DEXAMETHASONE SODIUM PHOSPHATE 10 MG/ML IJ SOLN
10.0000 mg | Freq: Once | INTRAMUSCULAR | Status: AC
  Administered 2022-08-17: 10 mg via INTRAVENOUS
  Filled 2022-08-16: qty 1

## 2022-08-16 MED ORDER — SODIUM CHLORIDE (PF) 0.9 % IJ SOLN
INTRAMUSCULAR | Status: DC | PRN
  Administered 2022-08-16: 60 mL

## 2022-08-16 MED ORDER — BUPIVACAINE LIPOSOME 1.3 % IJ SUSP
INTRAMUSCULAR | Status: AC
  Filled 2022-08-16: qty 20

## 2022-08-16 MED ORDER — ONDANSETRON HCL 4 MG/2ML IJ SOLN
INTRAMUSCULAR | Status: AC
  Filled 2022-08-16: qty 2

## 2022-08-16 MED ORDER — ATORVASTATIN CALCIUM 20 MG PO TABS: 20.0000 mg | ORAL_TABLET | Freq: Every evening | ORAL | Status: DC

## 2022-08-16 SURGICAL SUPPLY — 49 items
ADH SKN CLS APL DERMABOND .7 (GAUZE/BANDAGES/DRESSINGS) ×1
ATTUNE PS FEM RT SZ 5 CEM KNEE (Femur) IMPLANT
ATTUNE PSRP INSE SZ5 7 KNEE (Insert) IMPLANT
BASE TIBIAL ROT PLAT SZ 5 KNEE (Knees) IMPLANT
BLADE SAG 18X100X1.27 (BLADE) ×1 IMPLANT
BLADE SAW SGTL 11.0X1.19X90.0M (BLADE) ×1 IMPLANT
BNDG CMPR 6 X 5 YARDS HK CLSR (GAUZE/BANDAGES/DRESSINGS) ×1
BNDG ELASTIC 6INX 5YD STR LF (GAUZE/BANDAGES/DRESSINGS) ×1 IMPLANT
BOWL SMART MIX CTS (DISPOSABLE) ×1 IMPLANT
BSPLAT TIB 5 CMNT ROT PLAT STR (Knees) ×1 IMPLANT
CEMENT HV SMART SET (Cement) ×2 IMPLANT
COVER SURGICAL LIGHT HANDLE (MISCELLANEOUS) ×1 IMPLANT
CUFF TOURN SGL QUICK 34 (TOURNIQUET CUFF) ×1
CUFF TRNQT CYL 34X4.125X (TOURNIQUET CUFF) ×1 IMPLANT
DERMABOND ADVANCED .7 DNX12 (GAUZE/BANDAGES/DRESSINGS) ×1 IMPLANT
DRAPE INCISE IOBAN 66X45 STRL (DRAPES) ×1 IMPLANT
DRAPE U-SHAPE 47X51 STRL (DRAPES) ×1 IMPLANT
DRSG AQUACEL AG ADV 3.5X10 (GAUZE/BANDAGES/DRESSINGS) ×1 IMPLANT
DURAPREP 26ML APPLICATOR (WOUND CARE) ×1 IMPLANT
ELECT REM PT RETURN 15FT ADLT (MISCELLANEOUS) ×1 IMPLANT
GLOVE BIO SURGEON STRL SZ 6.5 (GLOVE) IMPLANT
GLOVE BIO SURGEON STRL SZ8 (GLOVE) ×1 IMPLANT
GLOVE BIOGEL PI IND STRL 7.0 (GLOVE) IMPLANT
GLOVE BIOGEL PI IND STRL 8 (GLOVE) ×1 IMPLANT
GOWN STRL REUS W/ TWL LRG LVL3 (GOWN DISPOSABLE) ×1 IMPLANT
GOWN STRL REUS W/TWL LRG LVL3 (GOWN DISPOSABLE) ×1
HANDPIECE INTERPULSE COAX TIP (DISPOSABLE) ×1
IMMOBILIZER KNEE 20 (SOFTGOODS) ×1
IMMOBILIZER KNEE 20 THIGH 36 (SOFTGOODS) ×1 IMPLANT
KIT TURNOVER KIT A (KITS) IMPLANT
MANIFOLD NEPTUNE II (INSTRUMENTS) ×1 IMPLANT
NS IRRIG 1000ML POUR BTL (IV SOLUTION) ×1 IMPLANT
PACK TOTAL KNEE CUSTOM (KITS) ×1 IMPLANT
PADDING CAST COTTON 6X4 STRL (CAST SUPPLIES) ×2 IMPLANT
PATELLA MEDIAL ATTUN 35MM KNEE (Knees) IMPLANT
PIN STEINMAN FIXATION KNEE (PIN) IMPLANT
PROTECTOR NERVE ULNAR (MISCELLANEOUS) ×1 IMPLANT
SET HNDPC FAN SPRY TIP SCT (DISPOSABLE) ×1 IMPLANT
SPIKE FLUID TRANSFER (MISCELLANEOUS) ×1 IMPLANT
SUT MNCRL AB 4-0 PS2 18 (SUTURE) ×1 IMPLANT
SUT STRATAFIX 0 PDS 27 VIOLET (SUTURE) ×1
SUT VIC AB 2-0 CT1 27 (SUTURE) ×3
SUT VIC AB 2-0 CT1 TAPERPNT 27 (SUTURE) ×3 IMPLANT
SUTURE STRATFX 0 PDS 27 VIOLET (SUTURE) ×1 IMPLANT
TIBIAL BASE ROT PLAT SZ 5 KNEE (Knees) ×1 IMPLANT
TRAY FOLEY MTR SLVR 16FR STAT (SET/KITS/TRAYS/PACK) ×1 IMPLANT
TUBE SUCTION HIGH CAP CLEAR NV (SUCTIONS) ×1 IMPLANT
WATER STERILE IRR 1000ML POUR (IV SOLUTION) ×2 IMPLANT
WRAP KNEE MAXI GEL POST OP (GAUZE/BANDAGES/DRESSINGS) ×1 IMPLANT

## 2022-08-16 NOTE — Progress Notes (Signed)
Orthopedic Tech Progress Note Patient Details:  Joanne Lewis [facility] 08-03-1945 [id number] Applied CPM per order. Will remove CPM at 2:30pm. CPM Right Knee CPM Right Knee: On Right Knee Flexion (Degrees): 40 Right Knee Extension (Degrees): 10  Post Interventions Patient Tolerated: Well Instructions Provided: Adjustment of device, Care of device Ortho Devices Type of Ortho Device: CPM padding Ortho Device/Splint Location: RLE Ortho Device/Splint Interventions: Ordered, Application, Adjustment   Post Interventions Patient Tolerated: Well Instructions Provided: Adjustment of device, Care of device  [name] 08/16/2022, 10:48 AM

## 2022-08-16 NOTE — Evaluation (Signed)
Physical Therapy Evaluation Patient Details Name: Joanne Lewis MRN: [id number] DOB: 06-14-1945 Today's Date: 08/16/2022  History of Present Illness  77 y.o. female admitted 08/16/22 for R TKA. L TKA 2022  Clinical Impression  Pt is s/p TKA resulting in the deficits listed below (see PT Problem List). Min assist supine to sit, min assist to transfer bed to recliner. Pt reported dizziness in standing so did not attempt ambulation. In recliner BP was 105/63 in reclined position. Pt reports she had issues with orthostatic hypotension during previous hospitalization for L TKA and had to stay for 4 days. RN aware of pt's dizziness.  Pt will benefit from acute skilled PT to increase their independence and safety with mobility to allow discharge.          If plan is discharge home, recommend the following: A little help with walking and/or transfers;A little help with bathing/dressing/bathroom;Assistance with cooking/housework;Help with stairs or ramp for entrance;Assist for transportation   Can travel by private vehicle        Equipment Recommendations None recommended by PT  Recommendations for Other Services       Functional Status Assessment Patient has had a recent decline in their functional status and demonstrates the ability to make significant improvements in function in a reasonable and predictable amount of time.     Precautions / Restrictions Precautions Precautions: Knee;Fall Restrictions Weight Bearing Restrictions: No Other Position/Activity Restrictions: WBAT      Mobility  Bed Mobility Overal bed mobility: Needs Assistance Bed Mobility: Supine to Sit     Supine to sit: Min assist     General bed mobility comments: min A to support RLE    Transfers Overall transfer level: Needs assistance Equipment used: Rolling walker (2 wheels) Transfers: Sit to/from Stand, Bed to chair/wheelchair/BSC Sit to Stand: Min guard   Step pivot transfers: Min guard        General transfer comment: VCs sequencing, pt reported dizziness in standing so assisted pt to recliner where BP was 105/63 in reclined position. Pt reports she had to stay in hospital for 4 days for L TKA 2* orthostatic hypotension.    Ambulation/Gait                  Stairs            Wheelchair Mobility     Tilt Bed    Modified Rankin (Stroke Patients Only)       Balance Overall balance assessment: Modified Independent                                           Pertinent Vitals/Pain Pain Assessment Pain Assessment: 0-10 Pain Score: 4  Pain Location: R knee Pain Descriptors / Indicators: Sore Pain Intervention(s): Limited activity within patient's tolerance, Monitored during session, RN gave pain meds during session, Patient requesting pain meds-RN notified, Ice applied    Home Living Family/patient expects to be discharged to:: Private residence Living Arrangements: Alone Available Help at Discharge: Neighbor Type of Home: House Home Access: Stairs to enter Entrance Stairs-Rails: None Entrance Stairs-Number of Steps: 3   Home Layout: Two level;Able to live on main level with bedroom/bathroom Home Equipment: Gilmer Mor - single point;Shower seat;Rolling Environmental consultant (2 wheels) Additional Comments: neighbor who is a CNA will be caregiver    Prior Function Prior Level of Function : Independent/Modified Independent  Mobility Comments: no falls in past 6 months, walked without AD ADLs Comments: independent     Hand Dominance        Extremity/Trunk Assessment   Upper Extremity Assessment Upper Extremity Assessment: Overall WFL for tasks assessed    Lower Extremity Assessment Lower Extremity Assessment: RLE deficits/detail RLE Deficits / Details: knee ext -3/5, SLR +2/5 RLE Sensation: WNL RLE Coordination: WNL    Cervical / Trunk Assessment Cervical / Trunk Assessment: Normal  Communication   Communication: No  difficulties  Cognition Arousal/Alertness: Awake/alert Behavior During Therapy: WFL for tasks assessed/performed Overall Cognitive Status: Within Functional Limits for tasks assessed                                          General Comments      Exercises Total Joint Exercises Ankle Circles/Pumps: AROM, Both, 10 reps, Supine Long Arc Quad: AROM, Right, 5 reps, Seated   Assessment/Plan    PT Assessment Patient needs continued PT services  PT Problem List Decreased activity tolerance;Decreased mobility;Decreased strength;Cardiopulmonary status limiting activity       PT Treatment Interventions Gait training;Therapeutic exercise;Balance training;Stair training;Functional mobility training;Therapeutic activities;Patient/family education    PT Goals (Current goals can be found in the Care Plan section)  Acute Rehab PT Goals Patient Stated Goal: yardwork, swim PT Goal Formulation: With patient Time For Goal Achievement: 08/23/22 Potential to Achieve Goals: Good    Frequency 7X/week     Co-evaluation               AM-PAC PT "6 Clicks" Mobility  Outcome Measure Help needed turning from your back to your side while in a flat bed without using bedrails?: A Little Help needed moving from lying on your back to sitting on the side of a flat bed without using bedrails?: A Little Help needed moving to and from a bed to a chair (including a wheelchair)?: A Little Help needed standing up from a chair using your arms (e.g., wheelchair or bedside chair)?: A Little Help needed to walk in hospital room?: A Lot Help needed climbing 3-5 steps with a railing? : Total 6 Click Score: 15    End of Session Equipment Utilized During Treatment: Gait belt Activity Tolerance: Treatment limited secondary to medical complications (Comment) (dizzy in standing) Patient left: in chair;with chair alarm set;with call bell/phone within reach   PT Visit Diagnosis: History of falling  (Z91.81);Muscle weakness (generalized) (M62.81);Other abnormalities of gait and mobility (R26.89)    Time: 1410-1440 PT Time Calculation (min) (ACUTE ONLY): 30 min   Charges:   PT Evaluation $PT Eval Moderate Complexity: 1 Mod PT Treatments $Therapeutic Activity: 8-22 mins PT General Charges $$ ACUTE PT VISIT: 1 Visit         Tamala Ser PT 08/16/2022  Acute Rehabilitation Services  Office [phone]

## 2022-08-16 NOTE — Transfer of Care (Signed)
Immediate Anesthesia Transfer of Care Note  Patient: Adrianna M Basey  Procedure(s) Performed: TOTAL KNEE ARTHROPLASTY (Right: Knee)  Patient Location: PACU  Anesthesia Type:Spinal  Level of Consciousness: awake, alert , and oriented  Airway & Oxygen Therapy: Patient Spontanous Breathing  Post-op Assessment: Report given to RN and Post -op Vital signs reviewed and stable  Post vital signs: Reviewed and stable  Last Vitals:  Vitals Value Taken Time  BP    Temp    Pulse 53 08/16/22 0946  Resp 10 08/16/22 0946  SpO2 100 % 08/16/22 0946  Vitals shown include unfiled device data.  Last Pain:  Vitals:   08/16/22 0810  PainSc: 0-No pain         Complications: No notable events documented.

## 2022-08-16 NOTE — Interval H&P Note (Signed)
History and Physical Interval Note:  08/16/2022 6:31 AM  [place]  has presented today for surgery, with the diagnosis of right knee osteoarthritis.  The various methods of treatment have been discussed with the patient and family. After consideration of risks, benefits and other options for treatment, the patient has consented to  Procedure(s): TOTAL KNEE ARTHROPLASTY (Right) as a surgical intervention.  The patient's history has been reviewed, patient examined, no change in status, stable for surgery.  I have reviewed the patient's chart and labs.  Questions were answered to the patient's satisfaction.     Homero Fellers 

## 2022-08-16 NOTE — Discharge Instructions (Addendum)
Joanne Gross, MD Total Joint Specialist [facility] [address] [place], [place] [address] [phone]  TOTAL KNEE REPLACEMENT POSTOPERATIVE DIRECTIONS    Knee Rehabilitation, Guidelines Following Surgery  Results after knee surgery are often greatly improved when you follow the exercise, range of motion and muscle strengthening exercises prescribed by your doctor. Safety measures are also important to protect the knee from further injury. If any of these exercises cause you to have increased pain or swelling in your knee joint, decrease the amount until you are comfortable again and slowly increase them. If you have problems or questions, call your caregiver or physical therapist for advice.   BLOOD CLOT PREVENTION Take a 2.5 mg Eliquis twice a day for three weeks following surgery. Then take an 81 mg Aspirin once a day for three weeks. Then discontinue Aspirin. You may resume your vitamins/supplements once you have discontinued the Xarelto. Do not take any NSAIDs (Advil, Aleve, Ibuprofen, Meloxicam, etc.) until you have discontinued the Xarelto.   HOME CARE INSTRUCTIONS  Remove items at home which could result in a fall. This includes throw rugs or furniture in walking pathways.  ICE to the affected knee as much as tolerated. Icing helps control swelling. If the swelling is well controlled you will be more comfortable and rehab easier. Continue to use ice on the knee for pain and swelling from surgery. You may notice swelling that will progress down to the foot and ankle. This is normal after surgery. Elevate the leg when you are not up walking on it.    Continue to use the breathing machine which will help keep your temperature down. It is common for your temperature to cycle up and down following surgery, especially at night when you are not up moving around and exerting yourself. The breathing machine keeps your lungs expanded and your temperature  down. Do not place pillow under the operative knee, focus on keeping the knee straight while resting  DIET You may resume your previous home diet once you are discharged from the hospital.  DRESSING / WOUND CARE / SHOWERING Keep your bulky bandage on for 2 days. On the third post-operative day you may remove the Ace bandage and gauze. There is a waterproof adhesive bandage on your skin which will stay in place until your first follow-up appointment. Once you remove this you will not need to place another bandage You may begin showering 3 days following surgery, but do not submerge the incision under water.  ACTIVITY For the first 5 days, the key is rest and control of pain and swelling Do your home exercises twice a day starting on post-operative day 3. On the days you go to physical therapy, just do the home exercises once that day. You should rest, ice and elevate the leg for 50 minutes out of every hour. Get up and walk/stretch for 10 minutes per hour. After 5 days you can increase your activity slowly as tolerated. Walk with your walker as instructed. Use the walker until you are comfortable transitioning to a cane. Walk with the cane in the opposite hand of the operative leg. You may discontinue the cane once you are comfortable and walking steadily. Avoid periods of inactivity such as sitting longer than an hour when not asleep. This helps prevent blood clots.  You may discontinue the knee immobilizer once you are able to perform a straight leg raise while lying down. You may resume a sexual relationship in one month or  when given the OK by your doctor.  You may return to work once you are cleared by your doctor.  Do not drive a car for 6 weeks or until released by your surgeon.  Do not drive while taking narcotics.  TED HOSE STOCKINGS Wear the elastic stockings on both legs for three weeks following surgery during the day. You may remove them at night for sleeping.  WEIGHT  BEARING Weight bearing as tolerated with assist device (walker, cane, etc) as directed, use it as long as suggested by your surgeon or therapist, typically at least 4-6 weeks.  POSTOPERATIVE CONSTIPATION PROTOCOL Constipation - defined medically as fewer than three stools per week and severe constipation as less than one stool per week.  One of the most common issues patients have following surgery is constipation.  Even if you have a regular bowel pattern at home, your normal regimen is likely to be disrupted due to multiple reasons following surgery.  Combination of anesthesia, postoperative narcotics, change in appetite and fluid intake all can affect your bowels.  In order to avoid complications following surgery, here are some recommendations in order to help you during your recovery period.  Colace (docusate) - Pick up an over-the-counter form of Colace or another stool softener and take twice a day as long as you are requiring postoperative pain medications.  Take with a full glass of water daily.  If you experience loose stools or diarrhea, hold the colace until you stool forms back up. If your symptoms do not get better within 1 week or if they get worse, check with your doctor. Dulcolax (bisacodyl) - Pick up over-the-counter and take as directed by the product packaging as needed to assist with the movement of your bowels.  Take with a full glass of water.  Use this product as needed if not relieved by Colace only.  MiraLax (polyethylene glycol) - Pick up over-the-counter to have on hand. MiraLax is a solution that will increase the amount of water in your bowels to assist with bowel movements.  Take as directed and can mix with a glass of water, juice, soda, coffee, or tea. Take if you go more than two days without a movement. Do not use MiraLax more than once per day. Call your doctor if you are still constipated or irregular after using this medication for 7 days in a row.  If you continue  to have problems with postoperative constipation, please contact the office for further assistance and recommendations.  If you experience "the worst abdominal pain ever" or develop nausea or vomiting, please contact the office immediatly for further recommendations for treatment.  ITCHING If you experience itching with your medications, try taking only a single pain pill, or even half a pain pill at a time.  You can also use Benadryl over the counter for itching or also to help with sleep.   MEDICATIONS See your medication summary on the "After Visit Summary" that the nursing staff will review with you prior to discharge.  You may have some home medications which will be placed on hold until you complete the course of blood thinner medication.  It is important for you to complete the blood thinner medication as prescribed by your surgeon.  Continue your approved medications as instructed at time of discharge.  PRECAUTIONS If you experience chest pain or shortness of breath - call 911 immediately for transfer to the hospital emergency department.  If you develop a fever greater that 101 F, purulent  drainage from wound, increased redness or drainage from wound, foul odor from the wound/dressing, or calf pain - CONTACT YOUR SURGEON.                                                   FOLLOW-UP APPOINTMENTS Make sure you keep all of your appointments after your operation with your surgeon and caregivers. You should call the office at the above phone number and make an appointment for approximately two weeks after the date of your surgery or on the date instructed by your surgeon outlined in the "After Visit Summary".  RANGE OF MOTION AND STRENGTHENING EXERCISES  Rehabilitation of the knee is important following a knee injury or an operation. After just a few days of immobilization, the muscles of the thigh which control the knee become weakened and shrink (atrophy). Knee exercises are designed to build up  the tone and strength of the thigh muscles and to improve knee motion. Often times heat used for twenty to thirty minutes before working out will loosen up your tissues and help with improving the range of motion but do not use heat for the first two weeks following surgery. These exercises can be done on a training (exercise) mat, on the floor, on a table or on a bed. Use what ever works the best and is most comfortable for you Knee exercises include:  Leg Lifts - While your knee is still immobilized in a splint or cast, you can do straight leg raises. Lift the leg to 60 degrees, hold for 3 sec, and slowly lower the leg. Repeat 10-20 times 2-3 times daily. Perform this exercise against resistance later as your knee gets better.  Quad and Hamstring Sets - Tighten up the muscle on the front of the thigh (Quad) and hold for 5-10 sec. Repeat this 10-20 times hourly. Hamstring sets are done by pushing the foot backward against an object and holding for 5-10 sec. Repeat as with quad sets.  Leg Slides: Lying on your back, slowly slide your foot toward your buttocks, bending your knee up off the floor (only go as far as is comfortable). Then slowly slide your foot back down until your leg is flat on the floor again. Angel Wings: Lying on your back spread your legs to the side as far apart as you can without causing discomfort.  A rehabilitation program following serious knee injuries can speed recovery and prevent re-injury in the future due to weakened muscles. Contact your doctor or a physical therapist for more information on knee rehabilitation.   POST-OPERATIVE OPIOID TAPER INSTRUCTIONS: It is important to wean off of your opioid medication as soon as possible. If you do not need pain medication after your surgery it is ok to stop day one. Opioids include: Codeine, Hydrocodone(Norco, Vicodin), Oxycodone(Percocet, oxycontin) and hydromorphone amongst others.  Long term and even short term use of opiods can  cause: Increased pain response Dependence Constipation Depression Respiratory depression And more.  Withdrawal symptoms can include Flu like symptoms Nausea, vomiting And more Techniques to manage these symptoms Hydrate well Eat regular healthy meals Stay active Use relaxation techniques(deep breathing, meditating, yoga) Do Not substitute Alcohol to help with tapering If you have been on opioids for less than two weeks and do not have pain than it is ok to stop all together.  Plan to   wean off of opioids This plan should start within one week post op of your joint replacement. Maintain the same interval or time between taking each dose and first decrease the dose.  Cut the total daily intake of opioids by one tablet each day Next start to increase the time between doses. The last dose that should be eliminated is the evening dose.   IF YOU ARE TRANSFERRED TO A SKILLED REHAB FACILITY If the patient is transferred to a skilled rehab facility following release from the hospital, a list of the current medications will be sent to the facility for the patient to continue.  When discharged from the skilled rehab facility, please have the facility set up the patient's Home Health Physical Therapy prior to being released. Also, the skilled facility will be responsible for providing the patient with their medications at time of release from the facility to include their pain medication, the muscle relaxants, and their blood thinner medication. If the patient is still at the rehab facility at time of the two week follow up appointment, the skilled rehab facility will also need to assist the patient in arranging follow up appointment in our office and any transportation needs.  MAKE SURE YOU:  Understand these instructions.  Get help right away if you are not doing well or get worse.   DENTAL ANTIBIOTICS:  In most cases prophylactic antibiotics for Dental procdeures after total joint surgery are  not necessary.  Exceptions are as follows:  1. History of prior total joint infection  2. Severely immunocompromised (Organ Transplant, cancer chemotherapy, Rheumatoid biologic meds such as Humera)  3. Poorly controlled diabetes (A1C &gt; 8.0, blood glucose over 200)  If you have one of these conditions, contact your surgeon for an antibiotic prescription, prior to your dental procedure.    Pick up stool softner and laxative for home use following surgery while on pain medications. Do not submerge incision under water. Please use good hand washing techniques while changing dressing each day. May shower starting three days after surgery. Please use a clean towel to pat the incision dry following showers. Continue to use ice for pain and swelling after surgery. Do not use any lotions or creams on the incision until instructed by your surgeon.  Information on my medicine - ELIQUIS (apixaban)  This medication education was reviewed with me or my healthcare representative as part of my discharge preparation.  The pharmacist that spoke with me during my hospital stay was:    Why was Eliquis prescribed for you? Eliquis was prescribed for you to reduce the risk of blood clots forming after orthopedic surgery.    What do You need to know about Eliquis? Take your Eliquis TWICE DAILY - one tablet in the morning and one tablet in the evening with or without food.  It would be best to take the dose about the same time each day.  If you have difficulty swallowing the tablet whole please discuss with your pharmacist how to take the medication safely.  Take Eliquis exactly as prescribed by your doctor and DO NOT stop taking Eliquis without talking to the doctor who prescribed the medication.  Stopping without other medication to take the place of Eliquis may increase your risk of developing a clot.  After discharge, you should have regular check-up appointments with your healthcare  provider that is prescribing your Eliquis.  What do you do if you miss a dose? If a dose of ELIQUIS is not taken at the scheduled  time, take it as soon as possible on the same day and twice-daily administration should be resumed.  The dose should not be doubled to make up for a missed dose.  Do not take more than one tablet of ELIQUIS at the same time.  Important Safety Information A possible side effect of Eliquis is bleeding. You should call your healthcare provider right away if you experience any of the following: Bleeding from an injury or your nose that does not stop. Unusual colored urine (red or dark brown) or unusual colored stools (red or black). Unusual bruising for unknown reasons. A serious fall or if you hit your head (even if there is no bleeding).  Some medicines may interact with Eliquis and might increase your risk of bleeding or clotting while on Eliquis. To help avoid this, consult your healthcare provider or pharmacist prior to using any new prescription or non-prescription medications, including herbals, vitamins, non-steroidal anti-inflammatory drugs (NSAIDs) and supplements.  This website has more information on Eliquis (apixaban): http://www.eliquis.com/eliquis/home

## 2022-08-16 NOTE — Anesthesia Procedure Notes (Signed)
Anesthesia Regional Block: Adductor canal block   Pre-Anesthetic Checklist: , timeout performed,  Correct Patient, Correct Site, Correct Laterality,  Correct Procedure, Correct Position, site marked,  Risks and benefits discussed,  Surgical consent,  Pre-op evaluation,  At surgeon's request and post-op pain management  Laterality: Lower and Right  Prep: chloraprep       Needles:  Injection technique: Single-shot  Needle Type: Echogenic Needle     Needle Length: 9cm  Needle Gauge: 22     Additional Needles:   Procedures:,,,, ultrasound used (permanent image in chart),,    Narrative:  Start time: 08/16/2022 7:50 AM End time: 08/16/2022 7:56 AM Injection made incrementally with aspirations every 5 mL.  Performed by: Personally  Anesthesiologist: Trevor Iha, MD  Additional Notes: Block assessed prior to surgery. Pt tolerated procedure well.

## 2022-08-16 NOTE — Anesthesia Postprocedure Evaluation (Signed)
Anesthesia Post Note  Patient: Joanne Lewis  Procedure(s) Performed: TOTAL KNEE ARTHROPLASTY (Right: Knee)     Patient location during evaluation: Nursing Unit Anesthesia Type: Regional and Spinal Level of consciousness: oriented and awake and alert Pain management: pain level controlled Vital Signs Assessment: post-procedure vital signs reviewed and stable Respiratory status: spontaneous breathing and respiratory function stable Cardiovascular status: blood pressure returned to baseline and stable Postop Assessment: no headache, no backache, no apparent nausea or vomiting and patient able to bend at knees Anesthetic complications: no   No notable events documented.  Last Vitals:  Vitals:   08/16/22 1000 08/16/22 1015  BP: (!) 114/53 (!) 122/46  Pulse: (!) 50 (!) 48  Resp: 10 11  Temp:    SpO2: 98% 97%    Last Pain:  Vitals:   08/16/22 1015  PainSc: 0-No pain                 Trevor Iha

## 2022-08-16 NOTE — Op Note (Signed)
OPERATIVE REPORT-TOTAL KNEE ARTHROPLASTY   Pre-operative diagnosis- Osteoarthritis  Right knee(s)  Post-operative diagnosis- Osteoarthritis Right knee(s)  Procedure-  Right  Total Knee Arthroplasty  Surgeon- Gus Rankin. , MD  Assistant- Rene Paci, PA-C   Anesthesia-   Adductor canal block and spinal  EBL-30 mL   Drains None  Tourniquet time-  Total Tourniquet Time Documented: Thigh (Right) - 35 minutes Total: Thigh (Right) - 35 minutes     Complications- None  Condition-PACU - hemodynamically stable.   Brief Clinical Note  Joanne Lewis is a 77 y.o. year old female with end stage OA of her right knee with progressively worsening pain and dysfunction. She has constant pain, with activity and at rest and significant functional deficits with difficulties even with ADLs. She has had extensive non-op management including analgesics, injections of cortisone and viscosupplements, and home exercise program, but remains in significant pain with significant dysfunction.Radiographs show bone on bone arthritis medial and patellofemoral. She presents now for right Total Knee Arthroplasty.     Procedure in detail---   The patient is brought into the operating room and positioned supine on the operating table. After successful administration of  Adductor canal block and spinal,   a tourniquet is placed high on the  Right thigh(s) and the lower extremity is prepped and draped in the usual sterile fashion. Time out is performed by the operating team and then the  Right lower extremity is wrapped in Esmarch, knee flexed and the tourniquet inflated to 300 mmHg.       A midline incision is made with a ten blade through the subcutaneous tissue to the level of the extensor mechanism. A fresh blade is used to make a medial parapatellar arthrotomy. Soft tissue over the proximal medial tibia is subperiosteally elevated to the joint line with a knife and into the semimembranosus bursa with a  Cobb elevator. Soft tissue over the proximal lateral tibia is elevated with attention being paid to avoiding the patellar tendon on the tibial tubercle. The patella is everted, knee flexed 90 degrees and the ACL and PCL are removed. Findings are bone on bone medial and patellofemoral with large global osteophytes        The drill is used to create a starting hole in the distal femur and the canal is thoroughly irrigated with sterile saline to remove the fatty contents. The 5 degree Right  valgus alignment guide is placed into the femoral canal and the distal femoral cutting block is pinned to remove 9 mm off the distal femur. Resection is made with an oscillating saw.      The tibia is subluxed forward and the menisci are removed. The extramedullary alignment guide is placed referencing proximally at the medial aspect of the tibial tubercle and distally along the second metatarsal axis and tibial crest. The block is pinned to remove 2mm off the more deficient medial  side. Resection is made with an oscillating saw. Size 5is the most appropriate size for the tibia and the proximal tibia is prepared with the modular drill and keel punch for that size.      The femoral sizing guide is placed and size 5 is most appropriate. Rotation is marked off the epicondylar axis and confirmed by creating a rectangular flexion gap at 90 degrees. The size 5 cutting block is pinned in this rotation and the anterior, posterior and chamfer cuts are made with the oscillating saw. The intercondylar block is then placed and that cut is made.  Trial size 5 tibial component, trial size 5 posterior stabilized femur and a 7  mm posterior stabilized rotating platform insert trial is placed. Full extension is achieved with excellent varus/valgus and anterior/posterior balance throughout full range of motion. The patella is everted and thickness measured to be 22  mm. Free hand resection is taken to 12 mm, a 35 template is placed, lug  holes are drilled, trial patella is placed, and it tracks normally. Osteophytes are removed off the posterior femur with the trial in place. All trials are removed and the cut bone surfaces prepared with pulsatile lavage. Cement is mixed and once ready for implantation, the size 5 tibial implant, size  5 posterior stabilized femoral component, and the size 35 patella are cemented in place and the patella is held with the clamp. The trial insert is placed and the knee held in full extension. The Exparel (20 ml mixed with 60 ml saline) is injected into the extensor mechanism, posterior capsule, medial and lateral gutters and subcutaneous tissues.  All extruded cement is removed and once the cement is hard the permanent 7 mm posterior stabilized rotating platform insert is placed into the tibial tray.      The wound is copiously irrigated with saline solution and the extensor mechanism closed with # 0 Stratofix suture. The tourniquet is released for a total tourniquet time of 35  minutes. Flexion against gravity is 140 degrees and the patella tracks normally. Subcutaneous tissue is closed with 2.0 vicryl and subcuticular with running 4.0 Monocryl. The incision is cleaned and dried and steri-strips and a bulky sterile dressing are applied. The limb is placed into a knee immobilizer and the patient is awakened and transported to recovery in stable condition.      Please note that a surgical assistant was a medical necessity for this procedure in order to perform it in a safe and expeditious manner. Surgical assistant was necessary to retract the ligaments and vital neurovascular structures to prevent injury to them and also necessary for proper positioning of the limb to allow for anatomic placement of the prosthesis.   Gus Rankin , MD    08/16/2022, 9:24 AM

## 2022-08-16 NOTE — Anesthesia Procedure Notes (Addendum)
Spinal  Patient location during procedure: OR Start time: 08/16/2022 8:16 AM End time: 08/16/2022 8:18 AM Reason for block: surgical anesthesia Staffing Performed: resident/CRNA  Anesthesiologist: Trevor Iha, MD Resident/CRNA: Doran Clay, CRNA Performed by: Doran Clay, CRNA Authorized by: Trevor Iha, MD   Preanesthetic Checklist Completed: patient identified, IV checked, site marked, risks and benefits discussed, surgical consent, monitors and equipment checked, pre-op evaluation and timeout performed Spinal Block Patient position: sitting Prep: DuraPrep Patient monitoring: heart rate, cardiac monitor, continuous pulse ox and blood pressure Approach: midline Location: L3-4 Injection technique: single-shot Needle Needle type: Pencan  Needle gauge: 24 G Needle length: 10 cm Needle insertion depth: 7 cm Assessment Sensory level: T6 Events: CSF return Additional Notes Timeout performed. Patient in sitting position. L3-4 identified. Cleansed with Duraprep. SAB without difficulty. To supine position.

## 2022-08-17 ENCOUNTER — Encounter (HOSPITAL_COMMUNITY): Payer: Self-pay | Admitting: Orthopedic Surgery

## 2022-08-17 DIAGNOSIS — M1711 Unilateral primary osteoarthritis, right knee: Secondary | ICD-10-CM | POA: Diagnosis not present

## 2022-08-17 MED ORDER — SODIUM CHLORIDE 0.9 % IV BOLUS
250.0000 mL | Freq: Once | INTRAVENOUS | Status: AC
  Administered 2022-08-17: 250 mL via INTRAVENOUS

## 2022-08-17 MED ORDER — METHOCARBAMOL 500 MG PO TABS: 500.0000 mg | ORAL_TABLET | Freq: Four times a day (QID) | ORAL | 0 refills | Status: AC | PRN

## 2022-08-17 MED ORDER — HYDROMORPHONE HCL 2 MG PO TABS: 2.0000 mg | ORAL_TABLET | Freq: Four times a day (QID) | ORAL | 0 refills | Status: AC | PRN

## 2022-08-17 MED ORDER — ONDANSETRON HCL 4 MG PO TABS: 4.0000 mg | ORAL_TABLET | Freq: Four times a day (QID) | ORAL | 0 refills | Status: AC | PRN

## 2022-08-17 MED ORDER — TRAMADOL HCL 50 MG PO TABS: 50.0000 mg | ORAL_TABLET | Freq: Four times a day (QID) | ORAL | 0 refills | Status: AC | PRN

## 2022-08-17 MED ORDER — APIXABAN 2.5 MG PO TABS: 2.5000 mg | ORAL_TABLET | Freq: Two times a day (BID) | ORAL | 0 refills | Status: AC

## 2022-08-17 MED ORDER — SODIUM CHLORIDE 0.9 % IV BOLUS: 250.0000 mL | Freq: Once | INTRAVENOUS | Status: DC

## 2022-08-17 NOTE — Progress Notes (Signed)
Provided discharge education/instructions, all questions and concerns addressed. Pt not in any distress, discharged home with all of her belongings accompanied by her friend.

## 2022-08-17 NOTE — TOC Transition Note (Signed)
Transition of Care [facility]) - CM/SW Discharge Note  Patient Details  Name: Joanne Lewis MRN: [id number] Date of Birth: 06-Apr-1945  Transition of Care [facility] CM/SW Contact:  Ewing Schlein, LCSW Phone Number: 08/17/2022, 11:52 AM  Clinical Narrative: Patient is expected to discharge home after working with PT. CSW met with patient to confirm discharge plan. Patient will go home with OPPT at [facility]. Patient has a rolling walker, shower chair, and cane at home so there are no DME needs at this time. TOC signing off.    Final next level of care: OP Rehab Barriers to Discharge: No Barriers Identified  Patient Goals and CMS Choice Choice offered to / list presented to : NA  Discharge Plan and Services Additional resources added to the After Visit Summary for       DME Arranged: N/A DME Agency: NA  Social Determinants of Health (SDOH) Interventions SDOH Screenings   Food Insecurity: No Food Insecurity (08/16/2022)  Housing: Low Risk  (08/16/2022)  Transportation Needs: No Transportation Needs (08/16/2022)  Utilities: Not At Risk (08/16/2022)  Tobacco Use: Low Risk  (08/16/2022)  Recent Concern: Tobacco Use - Medium Risk (06/17/2022)   Received from [facility], [facility]   Readmission Risk Interventions     No data to display

## 2022-08-17 NOTE — Progress Notes (Signed)
Physical Therapy Treatment Patient Details Name: Joanne Lewis MRN: [id number] DOB: [birth year] Today's Date: 08/17/2022   History of Present Illness 77 y.o. female admitted 08/16/22 for R TKA. L TKA 2022    PT Comments  POD # 1 am session General Comments: AxO x 3 very pleasant Lady who lives home alone but has a "very goodAcademic librarian who can help. Assisted OOB to amb required increased time.  General bed mobility comments: demonstarted and instructed how to use a belt to self assist LE.  General transfer comment: VC's on proper hand placementr as well as increased time.  Asissted on/off BSC.  Monitoring BP's  General Gait Details: tolerated an increased distance while monitoring BP's. No major c/o dizziness this session.  Did c/o mild feeling "funny" at end of 25 feet.  Returned to room in recliner and performed a few TKR TE's followed by ice. Pt will need another PT session to complete HEP Education and practice stairs.    If plan is discharge home, recommend the following: A little help with walking and/or transfers;A little help with bathing/dressing/bathroom;Assistance with cooking/housework;Help with stairs or ramp for entrance;Assist for transportation   Can travel by private vehicle        Equipment Recommendations  None recommended by PT    Recommendations for Other Services       Precautions / Restrictions Precautions Precautions: Knee;Fall Precaution Comments: no pillow under knee Restrictions Weight Bearing Restrictions: No RLE Weight Bearing: Weight bearing as tolerated     Mobility  Bed Mobility Overal bed mobility: Needs Assistance Bed Mobility: Supine to Sit     Supine to sit: Supervision, Min guard     General bed mobility comments: demonstarted and instructed how to use a belt to self assist LE    Transfers Overall transfer level: Needs assistance Equipment used: Rolling walker (2 wheels) Transfers: Sit to/from Stand Sit to Stand: Supervision, Min  guard           General transfer comment: VC's on proper hand placementr as well as increased time.  Asissted on/off BSC.  Monitoring BP's    Ambulation/Gait Ambulation/Gait assistance: Supervision, Min guard Gait Distance (Feet): 22 Feet Assistive device: Rolling walker (2 wheels) Gait Pattern/deviations: Step-to pattern, Decreased stance time - right Gait velocity: decreased     General Gait Details: tolerated an increased distance while monitoring BP's.   Stairs             Wheelchair Mobility     Tilt Bed    Modified Rankin (Stroke Patients Only)       Balance                                            Cognition Arousal/Alertness: Awake/alert Behavior During Therapy: WFL for tasks assessed/performed Overall Cognitive Status: Within Functional Limits for tasks assessed                                 General Comments: AxO x 3 very pleasant Lady who lives home alone but has a "very goodAcademic librarian who can help.        Exercises  Total Knee Replacement TE's following HEP handout 10 reps B LE ankle pumps 05 reps towel squeezes 05 reps knee presses 05 reps heel slides  05 reps SLR's 05 reps ABD  Educated on use of gait belt to assist with TE's Followed by ICE     General Comments        Pertinent Vitals/Pain Pain Assessment Pain Assessment: 0-10 Pain Score: 5  Pain Location: R knee Pain Descriptors / Indicators: Operative site guarding, Tender Pain Intervention(s): Monitored during session, Premedicated before session, Repositioned, Ice applied    Home Living                          Prior Function            PT Goals (current goals can now be found in the care plan section) Progress towards PT goals: Progressing toward goals    Frequency    7X/week      PT Plan Current plan remains appropriate    Co-evaluation              AM-PAC PT "6 Clicks" Mobility   Outcome Measure   Help needed turning from your back to your side while in a flat bed without using bedrails?: A Little Help needed moving from lying on your back to sitting on the side of a flat bed without using bedrails?: A Little Help needed moving to and from a bed to a chair (including a wheelchair)?: A Little Help needed standing up from a chair using your arms (e.g., wheelchair or bedside chair)?: A Little Help needed to walk in hospital room?: A Little Help needed climbing 3-5 steps with a railing? : A Lot 6 Click Score: 17    End of Session Equipment Utilized During Treatment: Gait belt Activity Tolerance: Treatment limited secondary to medical complications (Comment) Patient left: in chair;with chair alarm set;with call bell/phone within reach   PT Visit Diagnosis: History of falling (Z91.81);Muscle weakness (generalized) (M62.81);Other abnormalities of gait and mobility (R26.89)     Time: 1010-1050 PT Time Calculation (min) (ACUTE ONLY): 40 min  Charges:    $Gait Training: 8-22 mins $Therapeutic Exercise: 8-22 mins $Therapeutic Activity: 8-22 mins PT General Charges $$ ACUTE PT VISIT: 1 Visit                     Felecia Shelling  PTA Acute  Rehabilitation Services Office M-F          [phone]

## 2022-08-17 NOTE — Plan of Care (Signed)

## 2022-08-17 NOTE — Plan of Care (Signed)

## 2022-08-17 NOTE — Progress Notes (Signed)
Physical Therapy Treatment Patient Details Name: Joanne Lewis MRN: [id number] DOB: [birth year] Today's Date: 08/17/2022   History of Present Illness 77 y.o. female admitted 08/16/22 for R TKA. L TKA 2022    PT Comments  POD # 1 pm session with Neighbor who is a CNA Assisted with amb in hallway and practicing stairs.  General stair comments: with Neighbor "hands on" using safety belt while navigating 2 steps up forward with walker. Pt has met her mobility goals to D/C to home today. Addressed all mobility questions, discussed appropriate activity, educated on use of ICE.  Pt ready for D/C to home.    If plan is discharge home, recommend the following: A little help with walking and/or transfers;A little help with bathing/dressing/bathroom;Assistance with cooking/housework;Help with stairs or ramp for entrance;Assist for transportation   Can travel by private vehicle        Equipment Recommendations  None recommended by PT    Recommendations for Other Services       Precautions / Restrictions Precautions Precautions: Knee;Fall Precaution Comments: no pillow under knee Restrictions Weight Bearing Restrictions: No RLE Weight Bearing: Weight bearing as tolerated     Mobility  Bed Mobility Overal bed mobility: Needs Assistance Bed Mobility: Supine to Sit     Supine to sit: Supervision, Min guard     General bed mobility comments: OOB in recliner    Transfers Overall transfer level: Needs assistance Equipment used: Rolling walker (2 wheels) Transfers: Sit to/from Stand Sit to Stand: Supervision           General transfer comment: VC's on proper hand placementr as well as increased time.  Had neighbor "hands on" assist using safety belt.    Ambulation/Gait Ambulation/Gait assistance: Supervision Gait Distance (Feet): 25 Feet Assistive device: Rolling walker (2 wheels) Gait Pattern/deviations: Step-to pattern, Decreased stance time - right Gait velocity:  decreased     General Gait Details: had Neighbor "hands on" assist using safety belt.   Stairs Stairs: Yes Stairs assistance: Min assist Stair Management: No rails, Step to pattern, Forwards, With walker Number of Stairs: 2 General stair comments: with Neighbor "hands on" using safety belt while navigating 2 steps up forward with walker.   Wheelchair Mobility     Tilt Bed    Modified Rankin (Stroke Patients Only)       Balance                                            Cognition Arousal/Alertness: Awake/alert Behavior During Therapy: WFL for tasks assessed/performed Overall Cognitive Status: Within Functional Limits for tasks assessed                                 General Comments: AxO x 3 very pleasant Lady who lives home alone but has a "very goodAcademic librarian who can help.        Exercises      General Comments        Pertinent Vitals/Pain Pain Assessment Pain Assessment: 0-10 Pain Score: 5  Pain Location: R knee Pain Descriptors / Indicators: Operative site guarding, Tender Pain Intervention(s): Monitored during session, Premedicated before session, Repositioned, Ice applied    Home Living  Prior Function            PT Goals (current goals can now be found in the care plan section) Progress towards PT goals: Progressing toward goals    Frequency    7X/week      PT Plan Current plan remains appropriate    Co-evaluation              AM-PAC PT "6 Clicks" Mobility   Outcome Measure  Help needed turning from your back to your side while in a flat bed without using bedrails?: A Little Help needed moving from lying on your back to sitting on the side of a flat bed without using bedrails?: A Little Help needed moving to and from a bed to a chair (including a wheelchair)?: A Little Help needed standing up from a chair using your arms (e.g., wheelchair or bedside chair)?:  A Little Help needed to walk in hospital room?: A Little Help needed climbing 3-5 steps with a railing? : A Lot 6 Click Score: 17    End of Session Equipment Utilized During Treatment: Gait belt Activity Tolerance: Treatment limited secondary to medical complications (Comment) Patient left: in chair;with chair alarm set;with call bell/phone within reach   PT Visit Diagnosis: History of falling (Z91.81);Muscle weakness (generalized) (M62.81);Other abnormalities of gait and mobility (R26.89)     Time: 4098-1191 PT Time Calculation (min) (ACUTE ONLY): 15 min  Charges:    $Gait Training: 8-22 mins  PT General Charges $$ ACUTE PT VISIT: 1 Visit                     Felecia Shelling  PTA Acute  Rehabilitation Services Office M-F          [phone]

## 2022-08-17 NOTE — Progress Notes (Signed)
   Subjective: 1 Day Post-Op Procedure(s) (LRB): TOTAL KNEE ARTHROPLASTY (Right) Patient reports pain as mild.   Patient seen in rounds by Dr. Lequita Halt. Patient is feeling well this AM, had hypotension yesterday with therapy. Had issues with same for left TKA, prophylactic bolus given this AM. Will order one additional bolus prior to therapy. No other issues, foley catheter removed.  We will continue therapy today.  Objective: Vital signs in last 24 hours: Temp:  [97.2 F (36.2 C)-98.2 F (36.8 C)] 97.9 F (36.6 C) (08/06 0623) Pulse Rate:  [45-63] 62 (08/06 0623) Resp:  [10-20] 16 (08/06 0623) BP: (102-149)/(45-93) 131/54 (08/06 0623) SpO2:  [94 %-100 %] 98 % (08/06 0623) Weight:  [83.7 kg] 83.7 kg (08/05 1735)  Intake/Output from previous day:  Intake/Output Summary (Last 24 hours) at 08/17/2022 0823 Last data filed at 08/17/2022 0649 Gross per 24 hour  Intake 2758.72 ml  Output 1805 ml  Net 953.72 ml     Intake/Output this shift: No intake/output data recorded.  Labs: Recent Labs    08/17/22 0337  HGB 10.4*   Recent Labs    08/17/22 0337  WBC 10.0  RBC 3.46*  HCT 32.0*  PLT 136*   Recent Labs    08/17/22 0337  NA 136  K 4.9  CL 105  CO2 25  BUN 16  CREATININE 0.99  GLUCOSE 145*  CALCIUM 8.5*   No results for input(s): "LABPT", "INR" in the last 72 hours.  Exam: General - Patient is Alert and Oriented Extremity - Neurologically intact Neurovascular intact Sensation intact distally Dorsiflexion/Plantar flexion intact Dressing - dressing C/D/I Motor Function - intact, moving foot and toes well on exam.   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   Weight as of this encounter: 83.7 kg. Advance diet Up with therapy   Patient's anticipated LOS is less than 2 midnights, meeting these requirements: - Lives within 1 hour of care - Has a competent adult at home to recover with post-op recover - NO history of  - Chronic pain requiring opioids  - Diabetes  - Coronary Artery Disease  - Heart failure  - Heart attack  - Stroke  - DVT/VTE  - Cardiac arrhythmia  - Respiratory Failure/COPD  - Renal failure  - Anemia  - Advanced Liver disease  DVT Prophylaxis -  eliquis Weight bearing as tolerated. Continue therapy.  Plan is to go Home after hospital stay. Possible discharge later today if meeting goals with therapy and no issues with hypotension. Scheduled for OPPT at [facility]) Follow-up in the office in 2 weeks  The PDMP database was reviewed today prior to any opioid medications being prescribed to this patient.  Arther Abbott, PA-C Orthopedic Surgery [phone] 08/17/2022, 8:23 AM

## 2022-08-17 NOTE — Progress Notes (Signed)
Physical Therapy Treatment Patient Details Name: Joanne Lewis MRN: [id number] DOB: 1945/04/27 Today's Date: 08/17/2022   History of Present Illness 77 y.o. female admitted 08/16/22 for R TKA. L TKA 2022    PT Comments  POD # 1 pm session Assisted with amb in hallway and practiced stairs up forward with walker .  Performed a few TKR TE's followed by ICE. Pt has met her mobility goals to D/C to home today.  RN notified. Will repeat stair training with Neighbor when she arrives to pick pt up.     If plan is discharge home, recommend the following: A little help with walking and/or transfers;A little help with bathing/dressing/bathroom;Assistance with cooking/housework;Help with stairs or ramp for entrance;Assist for transportation   Can travel by private vehicle        Equipment Recommendations  None recommended by PT    Recommendations for Other Services       Precautions / Restrictions Precautions Precautions: Knee;Fall Precaution Comments: no pillow under knee Restrictions Weight Bearing Restrictions: No RLE Weight Bearing: Weight bearing as tolerated     Mobility  Bed Mobility Overal bed mobility: Needs Assistance Bed Mobility: Supine to Sit     Supine to sit: Supervision, Min guard     General bed mobility comments: OOB in recliner    Transfers Overall transfer level: Needs assistance Equipment used: Rolling walker (2 wheels) Transfers: Sit to/from Stand Sit to Stand: Supervision           General transfer comment: VC's on proper hand placementr as well as increased time.  Asissted on/off BSC.  No c/o's dizziness.    Ambulation/Gait Ambulation/Gait assistance: Supervision, Min guard Gait Distance (Feet): 65 Feet Assistive device: Rolling walker (2 wheels) Gait Pattern/deviations: Step-to pattern, Decreased stance time - right Gait velocity: decreased     General Gait Details: tolerated an increased distance with no c/o's  dizziness   Stairs Stairs: Yes Stairs assistance: Min assist Stair Management: No rails, Step to pattern, Forwards, With walker Number of Stairs: 4     Wheelchair Mobility     Tilt Bed    Modified Rankin (Stroke Patients Only)       Balance                                            Cognition Arousal/Alertness: Awake/alert Behavior During Therapy: WFL for tasks assessed/performed Overall Cognitive Status: Within Functional Limits for tasks assessed                                 General Comments: AxO x 3 very pleasant Lady who lives home alone but has a "very goodAcademic librarian who can help.        Exercises  05 reps seated knee bends  05 reps LAQ's  05 resp SLR Following HEP handout    General Comments        Pertinent Vitals/Pain Pain Assessment Pain Assessment: 0-10 Pain Score: 5  Pain Location: R knee Pain Descriptors / Indicators: Operative site guarding, Tender Pain Intervention(s): Monitored during session, Premedicated before session, Repositioned, Ice applied    Home Living                          Prior Function  PT Goals (current goals can now be found in the care plan section) Progress towards PT goals: Progressing toward goals    Frequency    7X/week      PT Plan Current plan remains appropriate    Co-evaluation              AM-PAC PT "6 Clicks" Mobility   Outcome Measure  Help needed turning from your back to your side while in a flat bed without using bedrails?: A Little Help needed moving from lying on your back to sitting on the side of a flat bed without using bedrails?: A Little Help needed moving to and from a bed to a chair (including a wheelchair)?: A Little Help needed standing up from a chair using your arms (e.g., wheelchair or bedside chair)?: A Little Help needed to walk in hospital room?: A Little Help needed climbing 3-5 steps with a railing? : A  Lot 6 Click Score: 17    End of Session Equipment Utilized During Treatment: Gait belt Activity Tolerance: Treatment limited secondary to medical complications (Comment) Patient left: in chair;with chair alarm set;with call bell/phone within reach   PT Visit Diagnosis: History of falling (Z91.81);Muscle weakness (generalized) (M62.81);Other abnormalities of gait and mobility (R26.89)     Time: 8657-8469 PT Time Calculation (min) (ACUTE ONLY): 27 min  Charges:    $Gait Training: 8-22 mins $Therapeutic Exercise: 8-22 mins PT General Charges $$ ACUTE PT VISIT: 1 Visit                     Felecia Shelling  PTA Acute  Rehabilitation Services Office M-F          [phone]

## 2022-08-17 NOTE — Care Management Obs Status (Signed)
MEDICARE OBSERVATION STATUS NOTIFICATION   Patient Details  Name: Joanne Lewis MRN: [id number] Date of Birth: 1945-10-14   Medicare Observation Status Notification Given:  Yes    Ewing Schlein, LCSW 08/17/2022, 11:52 AM

## 2022-08-19 ENCOUNTER — Ambulatory Visit: Payer: Medicare Other | Admitting: Physical Therapy

## 2022-08-20 ENCOUNTER — Ambulatory Visit: Payer: Medicare Other

## 2022-08-20 DIAGNOSIS — M6281 Muscle weakness (generalized): Secondary | ICD-10-CM

## 2022-08-20 DIAGNOSIS — M25661 Stiffness of right knee, not elsewhere classified: Secondary | ICD-10-CM

## 2022-08-20 NOTE — Therapy (Cosign Needed Addendum)
OUTPATIENT PHYSICAL THERAPY KNEE EVALUATION  Patient Name: Joanne Lewis MRN: [id number] DOB:01-10-1946, 77 y.o., female Today's Date: 08/20/2022  END OF SESSION:  PT End of Session - 08/20/22 1233     Visit Number 1    Number of Visits 25    Date for PT Re-Evaluation 11/12/22    Authorization Type MEDICARE PART A AND B    PT Start Time 1110    PT Stop Time 1200    PT Time Calculation (min) 50 min    Activity Tolerance Patient limited by pain;Treatment limited secondary to medical complications (Comment)   Severe swelling in RLE   Behavior During Therapy [facility] for tasks assessed/performed            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HCC)    Arthritis    Branch retinal artery occlusion, left eye    Chronic kidney disease    moderate   Fatigue    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 11/25/2017   Hyperkalemia    Hyperlipidemia    Hypertension    Pneumonia    at least 10 years ago   Pre-diabetes    Patient Active Problem List   Diagnosis Date Noted   Osteoarthritis of right knee 08/16/2022   Osteoarthritis of knee 08/13/2020   OA (osteoarthritis) of knee 08/11/2020   Primary osteoarthritis of left knee 08/11/2020   SOB (shortness of breath) 10/10/2017   Coronary artery calcification 09/19/2016   Chest pain 02/03/2016   Elevated troponin 02/03/2016   Anxiety 02/03/2016   Essential hypertension 02/03/2016   Mixed hyperlipidemia 02/03/2016   Chest tightness 01/01/2016    PCP: Lauro Regulus, MD  REFERRING PROVIDER: Lauro Regulus, MD  REFERRING DIAG: s/p R TKA  RATIONALE FOR EVALUATION AND TREATMENT: Rehabilitation  THERAPY DIAG: Muscle weakness (generalized)  Stiffness of right knee, not elsewhere classified  ONSET DATE: 08/16/2022  FOLLOW-UP APPT SCHEDULED WITH REFERRING PROVIDER: Yes    SUBJECTIVE:                                                                                                                                                                                          SUBJECTIVE STATEMENT:  s/p R TKR  PERTINENT HISTORY:  Joanne Lewis, 77 y.o. female has a history of pain and functional disability in the right knee due to arthritis and has failed non-surgical conservative treatments for greater than 12 weeks to include corticosteriod injections, viscosupplementation injections, and activity modification. She reports to physical therapy today s/p right TKA on 08/16/2022. "Since the surgery, I've felt well except  for yesterday. I woke up with significant pain in my right leg (8/10) and it kept me from walking or performing any exercise." Pt also complains of severe swelling in RLE. Pt reports slight pain in hip right hip flexor with exercises prescribed from hospital.   PAIN:   Pain Intensity: Present: 0/10, Best: 0/10, Worst: 8/10 Pain location: Right Knee Pain quality: intermittent, incisional   Radiating pain: No  Swelling: Yes  Popping, catching, locking: No  Numbness/Tingling: No Focal weakness or buckling: No Aggravating factors: Post-Op Exercises, Straight Leg Raise  Relieving factors: Resting, Ice  24-hour pain behavior: AM: Sharp Pain PM: Movement Reduces the pain throughout the day History of prior back, hip, or knee injury, pain, surgery, or therapy: Yes Left TKA Dominant hand: right Imaging: Yes  Prior level of function: Independent with household mobility with device Two-Wheeled Walker  Occupational demands: Retired  Presenter, broadcasting: Social research officer, government, Walking on [employer] flags: Negative for personal history of cancer, chills/fever, and denies night sweats, nausea, vomiting, unexplained weight gain/loss, unrelenting pain  PRECAUTIONS: Knee  WEIGHT BEARING RESTRICTIONS: No  FALLS: Has patient fallen in last 6 months? No  Living Environment Lives with: lives alone Lives in: House/apartment Stairs: 13 Steps Left Rail going up    Patient  Goals: "I would like to get stronger, build endurance and return to gardening."    OBJECTIVE:   Patient Surveys  FOTO 72, predicted to 60  Cognition Patient is oriented to person, place, and time.  Recent memory is intact.  Remote memory is intact.  Attention span and concentration are intact.  Expressive speech is intact.  Patient's fund of knowledge is within normal limits for educational level.    Gross Musculoskeletal Assessment Tremor: None Tone: Normal Severe swelling noted in R thigh and calf compared to LLE. PT measured lower extremity swelling 10cm inferior/superior to patella: R Inf: 43cm, L Inf: 37.5, R Sup: 59cm,  L Sup: 53cm.   GAIT: Distance walked: 43m  Assistive device utilized: Environmental consultant - 2 wheeled Level of assistance: Modified independence Comments: Patient presents with antalgic step through gait pattern. Decreased knee flexion during swing.  Posture:  AROM AROM (Normal range in degrees) AROM  Hip Right Left  Flexion (125) WNL WNL  Extension (15)    Abduction (40)    Adduction     Internal Rotation (45)    External Rotation (45)        Knee    Flexion (135) 96* 121  Extension (0) -14* (lacking) 8      Ankle    Dorsiflexion (20) WNL WNL  Plantarflexion (50) WNL WNL  Inversion (35) WNL WNL  Eversion (15 WNL   (* = pain; Blank rows = not tested)  LE MMT: MMT (out of 5) Right Left  Hip flexion    Hip extension    Hip abduction    Hip adduction    Hip internal rotation    Hip external rotation    Knee flexion    Knee extension    Ankle dorsiflexion 5 5  Ankle plantarflexion 5 5  Ankle inversion    Ankle eversion    (* = pain; Blank rows = not tested)  Sensation Grossly intact to light touch bilateral LEs as determined by testing dermatomes L2-S2. Proprioception, and hot/cold testing deferred on this date.  Reflexes Deferred  Muscle Length Deferred  Palpation Location LEFT  RIGHT           Quadriceps 0 0  Medial Hamstrings 0 2  Lateral Hamstrings 0 1  Lateral Hamstring tendon  1  Medial Hamstring tendon  1  Quadriceps tendon    Patella    Patellar Tendon    Tibial Tuberosity    Medial joint line    Lateral joint line    MCL    LCL    Adductor Tubercle    Pes Anserine tendon    Infrapatellar fat pad    Fibular head    Popliteal fossa 0 1  (Blank rows = not tested) Graded on 0-4 scale (0 = no pain, 1 = pain, 2 = pain with wincing/grimacing/flinching, 3 = pain with withdrawal, 4 = unwilling to allow palpation), (Blank rows = not tested)  Passive Accessory Motion Tibiofemoral: Deferred  Fibula on Femur: Deferred  Patellofemoral: Deferred   VASCULAR Dorsalis pedis and posterior tibial pulses are palpable  WELL Criteria for DVT:  4 Points - High Risk Factor for "Likely" DVT  Calf Swelling Greater than 3 cm compared to contralateral limb Localized Tenderness along the the deep venous system Entire Leg Swollen  Major Swelling within 12 weeks   TODAY'S TREATMENT: Deferred  PATIENT EDUCATION:  Education details: Plan of Care, HEP, Post-op Care  Person educated: Patient Education method: Explanation Education comprehension: verbalized understanding   HOME EXERCISE PROGRAM:  Ankle Pumps - 7x a week - 1 Daily - 1-2 sets - 10 reps Straight Leg Raise - 7x a week - 1 Daily - 1-2 sets - 10 reps   ASSESSMENT:  CLINICAL IMPRESSION: Patient is a 77 y.o. female who was seen today for physical therapy evaluation and treatment for post-op right TKA. Patient lives at home alone with a couple steps to enter; currently supported by a friend who assists with transportation and transfers. Patient currently presents with decreased ROM, strength and mobility limiting her functional mobility and participation in the gardening. Gross AROM/PROM in ankle and hip are normal. Palpation presents with tenderness along the joint line, popliteal fossa and medial and lateral hamstrings. Right leg presented with  significant swelling in comparison to contralateral leg. PT measured lower extremity swelling 10cm inferior/superior to patella: R Inf - 43cm, R Sup. 59cm, L Inf. - 37.5, L - Sup. 53cm.  PT performed Wells Criteria and patient scored with 4 points (high risk factor for likely DVT); PT recommended pt to consult with surgeon office regarding swelling and pt agrees.   OBJECTIVE IMPAIRMENTS: Abnormal gait, decreased balance, decreased endurance, difficulty walking, decreased ROM, decreased strength, and pain.   ACTIVITY LIMITATIONS: bending, sitting, standing, squatting, and transfers  PARTICIPATION LIMITATIONS: community activity and yard work  PERSONAL FACTORS: Age, Past/current experiences, and 3+ comorbidities: CKD, Hyperkalemia, HLD, HTN , PAN  are also affecting patient's functional outcome.   REHAB POTENTIAL: Good  CLINICAL DECISION MAKING: Stable/uncomplicated  EVALUATION COMPLEXITY: Moderate   GOALS: Goals reviewed with patient? Yes  SHORT TERM GOALS: Target date: 10/15/2022  Pt will be independent with HEP to improve strength and decrease knee pain to improve pain-free function at home and work. Baseline:  Goal status: INITIAL   LONG TERM GOALS: Target date: 11/12/2022  Pt will increase FOTO to at least 66 to demonstrate significant improvement in function at home and work related to knee pain  Baseline: 08/20/22: 53 Goal status: INITIAL  2.  Pt will decrease worst knee pain by at least 3 points on the NPRS in order to demonstrate clinically significant reduction in knee pain. Baseline: 08/09: 8/10 Goal status: INITIAL  3. Pt will be able to  perform deep squat without pain in order to demonstrate improvement in function as she returns to gardening. Baseline:  al status: INITIAL  4. Pt will be able to achieve less than 5 degrees of knee extension and at least 115 degrees of knee flexion in order to normalize gait pattern and improve functional mobility.  Baseline: 08/09:  -14 to 96 al status: INITIAL  PLAN: PT FREQUENCY: 1-2x/week  PT DURATION: 12 weeks  PLANNED INTERVENTIONS: Therapeutic exercises, Therapeutic activity, Neuromuscular re-education, Balance training, Gait training, Patient/Family education, Self Care, Joint mobilization, Joint manipulation, Vestibular training, Canalith repositioning, Orthotic/Fit training, DME instructions, Dry Needling, Electrical stimulation, Spinal manipulation, Spinal mobilization, Cryotherapy, Moist heat, Taping, Traction, Ultrasound, Ionotophoresis 4mg /ml Dexamethasone, Manual therapy, and Re-evaluation.  PLAN FOR NEXT SESSION: STM, LE Strengthening, Progress and modify HEP.    Lynnea Maizes PT, DPT, GCS   , SPT

## 2022-08-23 NOTE — Discharge Summary (Signed)
Patient ID: Joanne Lewis MRN: [id number] DOB/AGE: 1945-08-25 77 y.o.  Admit date: 08/16/2022 Discharge date: 08/17/2022  Admission Diagnoses:  Principal Problem:   OA (osteoarthritis) of knee Active Problems:   Osteoarthritis of right knee   Discharge Diagnoses:  Same  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HCC)    Arthritis    Branch retinal artery occlusion, left eye    Chronic kidney disease    moderate   Fatigue    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 11/25/2017   Hyperkalemia    Hyperlipidemia    Hypertension    Pneumonia    at least 10 years ago   Pre-diabetes     Surgeries: Procedure(s): TOTAL KNEE ARTHROPLASTY on 08/16/2022   Consultants:   Discharged Condition: Improved  Hospital Course: Joanne Lewis is an 77 y.o. female who was admitted 08/16/2022 for operative treatment ofOA (osteoarthritis) of knee. Patient has severe unremitting pain that affects sleep, daily activities, and work/hobbies. After pre-op clearance the patient was taken to the operating room on 08/16/2022 and underwent  Procedure(s): TOTAL KNEE ARTHROPLASTY.    Patient was given perioperative antibiotics:  Anti-infectives (From admission, onward)    Start     Dose/Rate Route Frequency Ordered Stop   08/16/22 1430  ceFAZolin (ANCEF) IVPB 2g/100 mL premix        2 g 200 mL/hr over 30 Minutes Intravenous Every 6 hours 08/16/22 1017 08/16/22 2045   08/16/22 0600  ceFAZolin (ANCEF) IVPB 2g/100 mL premix        2 g 200 mL/hr over 30 Minutes Intravenous On call to O.R. 08/16/22 1191 08/16/22 0851        Patient was given sequential compression devices, early ambulation, and chemoprophylaxis to prevent DVT.  Patient benefited maximally from hospital stay and there were no complications.    Recent vital signs: No data found.   Recent laboratory studies: No results for input(s): "WBC", "HGB", "HCT", "PLT", "NA", "K", "CL", "CO2",  "BUN", "CREATININE", "GLUCOSE", "INR", "CALCIUM" in the last 72 hours.  Invalid input(s): "PT", "2"   Discharge Medications:   Allergies as of 08/17/2022       Reactions   Codeine Diarrhea, Nausea And Vomiting   Other Itching   Pt sensitive to adhesives to skin .         Medication List     TAKE these medications    apixaban 2.5 MG Tabs tablet Commonly known as: ELIQUIS Take 1 tablet (2.5 mg total) by mouth every 12 (twelve) hours for 20 days. Then take one 81 mg aspirin once a day for three weeks. Then discontinue aspirin.   atorvastatin 20 MG tablet Commonly known as: LIPITOR Take 20 mg by mouth every evening. Notes to patient: Resume home regimen   carvedilol 6.25 MG tablet Commonly known as: COREG Take 6.25 mg by mouth 2 (two) times daily. Notes to patient: Resume home regimen   HYDROmorphone 2 MG tablet Commonly known as: DILAUDID Take 1-1.5 tablets (2-3 mg total) by mouth every 6 (six) hours as needed for severe pain. Notes to patient: Last dose given 08/05 02:27pm   latanoprost 0.005 % ophthalmic solution Commonly known as: XALATAN Place 1 drop into both eyes at bedtime.   losartan 100 MG tablet Commonly known as: COZAAR Take 100 mg by mouth daily. Notes to patient: Resume home regimen   methocarbamol 500 MG tablet Commonly known as: ROBAXIN Take 1 tablet (500 mg total) by  mouth every 6 (six) hours as needed for muscle spasms. Notes to patient: Last dose given 08/05 09:42pm   ondansetron 4 MG tablet Commonly known as: ZOFRAN Take 1 tablet (4 mg total) by mouth every 6 (six) hours as needed for nausea. Notes to patient: Last dose given 08/05 08:26am   timolol 0.5 % ophthalmic solution Commonly known as: TIMOPTIC Place 1 drop into both eyes in the morning.   traMADol 50 MG tablet Commonly known as: ULTRAM Take 1-2 tablets (50-100 mg total) by mouth every 6 (six) hours as needed for moderate pain. Notes to patient: Last dose given 08/06 01:52pm                Discharge Care Instructions  (From admission, onward)           Start     Ordered   08/17/22 0000  Weight bearing as tolerated        08/17/22 1502   08/17/22 0000  Change dressing       Comments: You may remove the bulky bandage (ACE wrap and gauze) two days after surgery. You will have an adhesive waterproof bandage underneath. Leave this in place until your first follow-up appointment.   08/17/22 1502            Diagnostic Studies: No results found.  Disposition: Discharge disposition: 01-Home or Self Care       Discharge Instructions     Call MD / Call 911   Complete by: As directed    If you experience chest pain or shortness of breath, CALL 911 and be transported to the hospital emergency room.  If you develope a fever above 101 F, pus (white drainage) or increased drainage or redness at the wound, or calf pain, call your surgeon's office.   Change dressing   Complete by: As directed    You may remove the bulky bandage (ACE wrap and gauze) two days after surgery. You will have an adhesive waterproof bandage underneath. Leave this in place until your first follow-up appointment.   Constipation Prevention   Complete by: As directed    Drink plenty of fluids.  Prune juice may be helpful.  You may use a stool softener, such as Colace (over the counter) 100 mg twice a day.  Use MiraLax (over the counter) for constipation as needed.   Diet - low sodium heart healthy   Complete by: As directed    Do not put a pillow under the knee. Place it under the heel.   Complete by: As directed    Driving restrictions   Complete by: As directed    No driving for two weeks   Post-operative opioid taper instructions:   Complete by: As directed    POST-OPERATIVE OPIOID TAPER INSTRUCTIONS: It is important to wean off of your opioid medication as soon as possible. If you do not need pain medication after your surgery it is ok to stop day one. Opioids  include: Codeine, Hydrocodone(Norco, Vicodin), Oxycodone(Percocet, oxycontin) and hydromorphone amongst others.  Long term and even short term use of opiods can cause: Increased pain response Dependence Constipation Depression Respiratory depression And more.  Withdrawal symptoms can include Flu like symptoms Nausea, vomiting And more Techniques to manage these symptoms Hydrate well Eat regular healthy meals Stay active Use relaxation techniques(deep breathing, meditating, yoga) Do Not substitute Alcohol to help with tapering If you have been on opioids for less than two weeks and do not have pain than it is  ok to stop all together.  Plan to wean off of opioids This plan should start within one week post op of your joint replacement. Maintain the same interval or time between taking each dose and first decrease the dose.  Cut the total daily intake of opioids by one tablet each day Next start to increase the time between doses. The last dose that should be eliminated is the evening dose.      TED hose   Complete by: As directed    Use stockings (TED hose) for three weeks on both leg(s).  You may remove them at night for sleeping.   Weight bearing as tolerated   Complete by: As directed         Follow-up Information     Aluisio, Homero Fellers, MD. Schedule an appointment as soon as possible for a visit in 2 week(s).   Specialty: Orthopedic Surgery Contact information: [address] [place] [address] [phone]                  Signed: Arther Abbott 08/23/2022, 8:22 AM

## 2022-08-24 ENCOUNTER — Ambulatory Visit: Payer: Medicare Other

## 2022-08-24 ENCOUNTER — Encounter: Payer: Self-pay | Admitting: Physical Therapy

## 2022-08-24 DIAGNOSIS — M6281 Muscle weakness (generalized): Secondary | ICD-10-CM | POA: Diagnosis not present

## 2022-08-24 DIAGNOSIS — Z96651 Presence of right artificial knee joint: Secondary | ICD-10-CM

## 2022-08-24 DIAGNOSIS — R269 Unspecified abnormalities of gait and mobility: Secondary | ICD-10-CM

## 2022-08-24 DIAGNOSIS — M25661 Stiffness of right knee, not elsewhere classified: Secondary | ICD-10-CM

## 2022-08-24 NOTE — Therapy (Signed)
OUTPATIENT PHYSICAL THERAPY KNEE EVALUATION  Patient Name: Joanne Lewis MRN: [id number] DOB:1945/12/28, 77 y.o., female Today's Date: 08/24/2022  END OF SESSION:  PT End of Session - 08/24/22 1554     Visit Number 2    Number of Visits 25    Date for PT Re-Evaluation 11/12/22    PT Start Time 1345    PT Stop Time 1430    PT Time Calculation (min) 45 min    Activity Tolerance Patient limited by pain    Behavior During Therapy [facility] for tasks assessed/performed             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HCC)    Arthritis    Branch retinal artery occlusion, left eye    Chronic kidney disease    moderate   Fatigue    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 11/25/2017   Hyperkalemia    Hyperlipidemia    Hypertension    Pneumonia    at least 10 years ago   Pre-diabetes    Patient Active Problem List   Diagnosis Date Noted   Osteoarthritis of right knee 08/16/2022   Osteoarthritis of knee 08/13/2020   OA (osteoarthritis) of knee 08/11/2020   Primary osteoarthritis of left knee 08/11/2020   SOB (shortness of breath) 10/10/2017   Coronary artery calcification 09/19/2016   Chest pain 02/03/2016   Elevated troponin 02/03/2016   Anxiety 02/03/2016   Essential hypertension 02/03/2016   Mixed hyperlipidemia 02/03/2016   Chest tightness 01/01/2016    PCP: Lauro Regulus, MD  REFERRING PROVIDER: Lauro Regulus, MD  REFERRING DIAG: s/p R TKA  RATIONALE FOR EVALUATION AND TREATMENT: Rehabilitation  THERAPY DIAG: Total knee replacement status, right  Muscle weakness (generalized)  Stiffness of right knee, not elsewhere classified  Gait difficulty  ONSET DATE: 08/16/2022  FOLLOW-UP APPT SCHEDULED WITH REFERRING PROVIDER: Yes    SUBJECTIVE:                                                                                                                                                                                          SUBJECTIVE STATEMENT:  s/p R TKR  PERTINENT HISTORY:  Joanne Lewis, 77 y.o. female has a history of pain and functional disability in the right knee due to arthritis and has failed non-surgical conservative treatments for greater than 12 weeks to include corticosteriod injections, viscosupplementation injections, and activity modification. She reports to physical therapy today s/p right TKA on 08/16/2022. "Since the surgery, I've felt well except for yesterday. I woke up with significant pain in my right  leg (8/10) and it kept me from walking or performing any exercise." Pt also complains of severe swelling in RLE. Pt reports slight pain in hip right hip flexor with exercises prescribed from hospital.   PAIN:   Pain Intensity: Present: 0/10, Best: 0/10, Worst: 8/10 Pain location: Right Knee Pain quality: intermittent, incisional   Radiating pain: No  Swelling: Yes  Popping, catching, locking: No  Numbness/Tingling: No Focal weakness or buckling: No Aggravating factors: Post-Op Exercises, Straight Leg Raise  Relieving factors: Resting, Ice  24-hour pain behavior: AM: Sharp Pain PM: Movement Reduces the pain throughout the day History of prior back, hip, or knee injury, pain, surgery, or therapy: Yes Left TKA Dominant hand: right Imaging: Yes  Prior level of function: Independent with household mobility with device Two-Wheeled Walker  Occupational demands: Retired  Presenter, broadcasting: Social research officer, government, Walking on [employer] flags: Negative for personal history of cancer, chills/fever, and denies night sweats, nausea, vomiting, unexplained weight gain/loss, unrelenting pain  PRECAUTIONS: Knee  WEIGHT BEARING RESTRICTIONS: No  FALLS: Has patient fallen in last 6 months? No  Living Environment Lives with: lives alone Lives in: House/apartment Stairs: 13 Steps Left Rail going up    Patient Goals: "I would like to get stronger, build endurance and return to  gardening."    OBJECTIVE:   Patient Surveys  FOTO 62, predicted to 32  Cognition Patient is oriented to person, place, and time.  Recent memory is intact.  Remote memory is intact.  Attention span and concentration are intact.  Expressive speech is intact.  Patient's fund of knowledge is within normal limits for educational level.    Gross Musculoskeletal Assessment Tremor: None Tone: Normal Severe swelling noted in R thigh and calf compared to LLE. PT measured lower extremity swelling 10cm inferior/superior to patella: R Inf: 43cm, L Inf: 37.5, R Sup: 59cm,  L Sup: 53cm.   GAIT: Distance walked: 42m  Assistive device utilized: Environmental consultant - 2 wheeled Level of assistance: Modified independence Comments: Patient presents with antalgic step through gait pattern. Decreased knee flexion during swing.  Posture:  AROM AROM (Normal range in degrees) AROM  Hip Right Left  Flexion (125) WNL WNL  Extension (15)    Abduction (40)    Adduction     Internal Rotation (45)    External Rotation (45)        Knee    Flexion (135) 96* 121  Extension (0) -14* (lacking) 8      Ankle    Dorsiflexion (20) WNL WNL  Plantarflexion (50) WNL WNL  Inversion (35) WNL WNL  Eversion (15 WNL   (* = pain; Blank rows = not tested)  LE MMT: MMT (out of 5) Right Left  Hip flexion    Hip extension    Hip abduction    Hip adduction    Hip internal rotation    Hip external rotation    Knee flexion    Knee extension    Ankle dorsiflexion 5 5  Ankle plantarflexion 5 5  Ankle inversion    Ankle eversion    (* = pain; Blank rows = not tested)  Sensation Grossly intact to light touch bilateral LEs as determined by testing dermatomes L2-S2. Proprioception, and hot/cold testing deferred on this date.  Reflexes Deferred  Muscle Length Deferred  Palpation Location LEFT  RIGHT           Quadriceps 0 0  Medial Hamstrings 0 2  Lateral Hamstrings 0 1  Lateral Hamstring tendon  1  Medial  Hamstring tendon  1  Quadriceps tendon    Patella    Patellar Tendon    Tibial Tuberosity    Medial joint line    Lateral joint line    MCL    LCL    Adductor Tubercle    Pes Anserine tendon    Infrapatellar fat pad    Fibular head    Popliteal fossa 0 1  (Blank rows = not tested) Graded on 0-4 scale (0 = no pain, 1 = pain, 2 = pain with wincing/grimacing/flinching, 3 = pain with withdrawal, 4 = unwilling to allow palpation), (Blank rows = not tested)  Passive Accessory Motion Tibiofemoral: Deferred  Fibula on Femur: Deferred  Patellofemoral: Deferred   VASCULAR Dorsalis pedis and posterior tibial pulses are palpable  WELL Criteria for DVT:  4 Points - High Risk Factor for "Likely" DVT  Calf Swelling Greater than 3 cm compared to contralateral limb Localized Tenderness along the the deep venous system Entire Leg Swollen  Major Swelling within 12 weeks   TODAY'S TREATMENT (08/24/2022)  Subjective: Pt reports not feeling well since surgery. Says her RLE is still very swollen and bruised. Says she has been trying doing her HEP but she has the most trouble with SLR. Pain right now is 3.5/10 but she took her pain meds beforehand which helped.  Objective:  Therex: Supine R quad sets: 1x15 - Verbal/tactile cues for correct form  Gentle R hip/knee flexion PROM in supine: 1x10 sets with 2-3 second holds at end-range  Supine R Heel Slides: 1x8   Sitting LAQ on R: 2x15 - verbal/tactile cues for proper form, ensuring no compensation with hip flexors  Seated alternating marches: 2x20   Manual: Supine Grade 1-2 R patellar mobilizations: superior/inferior + medial/lateral - x5 minutes   PATIENT EDUCATION:  Education details: Plan of Care, HEP, Post-op Care  Person educated: Patient Education method: Explanation Education comprehension: verbalized understanding   HOME EXERCISE PROGRAM:  Ankle Pumps - 7x a week - 1 Daily - 1-2 sets - 10 reps Straight Leg Raise - 7x  a week - 1 Daily - 1-2 sets - 10 reps   ASSESSMENT:  CLINICAL IMPRESSION: Pt presents to PT with 3.5/10 R knee pain; still demonstrates significant swelling and bruising of the entire R LE. Session today consisted of gentle stretching of the R knee and exercises aimed to improve R knee ROM and RLE strength. Pt continues to be very pain limited and demonstrates decreased R knee flexion and extension. Pt educated on continued use of ice at home, as well as optimal positioning of RLE when at rest to promote decreased swelling and increased knee extension. Pt will benefit from continued PT services upon discharge to safely address deficits listed in patient problem list for decreased caregiver assistance and eventual return to PLOF.   OBJECTIVE IMPAIRMENTS: Abnormal gait, decreased balance, decreased endurance, difficulty walking, decreased ROM, decreased strength, and pain.   ACTIVITY LIMITATIONS: bending, sitting, standing, squatting, and transfers  PARTICIPATION LIMITATIONS: community activity and yard work  PERSONAL FACTORS: Age, Past/current experiences, and 3+ comorbidities: CKD, Hyperkalemia, HLD, HTN , PAN  are also affecting patient's functional outcome.   REHAB POTENTIAL: Good  CLINICAL DECISION MAKING: Stable/uncomplicated  EVALUATION COMPLEXITY: Moderate   GOALS: Goals reviewed with patient? Yes  SHORT TERM GOALS: Target date: 10/15/2022  Pt will be independent with HEP to improve strength and decrease knee pain to improve pain-free function at home and work. Baseline:  Goal status: INITIAL  LONG TERM GOALS: Target date: 11/12/2022  Pt will increase FOTO to at least 66 to demonstrate significant improvement in function at home and work related to knee pain  Baseline: 08/20/22: 53 Goal status: INITIAL  2.  Pt will decrease worst knee pain by at least 3 points on the NPRS in order to demonstrate clinically significant reduction in knee pain. Baseline: 08/09: 8/10 Goal  status: INITIAL  3. Pt will be able to perform deep squat without pain in order to demonstrate improvement in function as she returns to gardening. Baseline:  Goal status: INITIAL  4. Pt will be able to achieve less than 5 degrees of knee extension and at least 115 degrees of knee flexion in order to normalize gait pattern and improve functional mobility.  Baseline: 08/09: -14 to 96 Goal status: INITIAL  PLAN: PT FREQUENCY: 1-2x/week  PT DURATION: 12 weeks  PLANNED INTERVENTIONS: Therapeutic exercises, Therapeutic activity, Neuromuscular re-education, Balance training, Gait training, Patient/Family education, Self Care, Joint mobilization, Joint manipulation, Vestibular training, Canalith repositioning, Orthotic/Fit training, DME instructions, Dry Needling, Electrical stimulation, Spinal manipulation, Spinal mobilization, Cryotherapy, Moist heat, Taping, Traction, Ultrasound, Ionotophoresis 4mg /ml Dexamethasone, Manual therapy, and Re-evaluation.  PLAN FOR NEXT SESSION: Progress R knee PROM/AROM exercises as tolerated, progress R LE strengthening as tolerated    , SPT  Maylon Peppers, PT, DPT Physical Therapist - [place]  [facility]   08/24/22, 3:57 PM

## 2022-08-27 ENCOUNTER — Encounter: Payer: Self-pay | Admitting: Physical Therapy

## 2022-08-27 ENCOUNTER — Ambulatory Visit: Payer: Medicare Other | Admitting: Physical Therapy

## 2022-08-27 DIAGNOSIS — M6281 Muscle weakness (generalized): Secondary | ICD-10-CM

## 2022-08-27 DIAGNOSIS — M25661 Stiffness of right knee, not elsewhere classified: Secondary | ICD-10-CM

## 2022-08-27 DIAGNOSIS — R269 Unspecified abnormalities of gait and mobility: Secondary | ICD-10-CM

## 2022-08-27 DIAGNOSIS — Z96651 Presence of right artificial knee joint: Secondary | ICD-10-CM

## 2022-08-27 NOTE — Therapy (Signed)
OUTPATIENT PHYSICAL THERAPY KNEE TREATMENT  Patient Name: Joanne Lewis MRN: [id number] DOB:14-Nov-1945, 77 y.o., female Today's Date: 08/27/2022  END OF SESSION:  PT End of Session - 08/27/22 1321     Visit Number 3    Number of Visits 25    Date for PT Re-Evaluation 11/12/22    Activity Tolerance Patient limited by pain    Behavior During Therapy [facility] for tasks assessed/performed            1332 to 1430  (58 minutes).  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HCC)    Arthritis    Branch retinal artery occlusion, left eye    Chronic kidney disease    moderate   Fatigue    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 11/25/2017   Hyperkalemia    Hyperlipidemia    Hypertension    Pneumonia    at least 10 years ago   Pre-diabetes    Patient Active Problem List   Diagnosis Date Noted   Osteoarthritis of right knee 08/16/2022   Osteoarthritis of knee 08/13/2020   OA (osteoarthritis) of knee 08/11/2020   Primary osteoarthritis of left knee 08/11/2020   SOB (shortness of breath) 10/10/2017   Coronary artery calcification 09/19/2016   Chest pain 02/03/2016   Elevated troponin 02/03/2016   Anxiety 02/03/2016   Essential hypertension 02/03/2016   Mixed hyperlipidemia 02/03/2016   Chest tightness 01/01/2016    PCP: Lauro Regulus, MD  REFERRING PROVIDER: Lauro Regulus, MD  REFERRING DIAG: s/p R TKA  RATIONALE FOR EVALUATION AND TREATMENT: Rehabilitation  THERAPY DIAG: Total knee replacement status, right  Muscle weakness (generalized)  Stiffness of right knee, not elsewhere classified  Gait difficulty  Knee joint stiffness, bilateral  ONSET DATE: 08/16/2022  FOLLOW-UP APPT SCHEDULED WITH REFERRING PROVIDER: Yes    SUBJECTIVE:                                                                                                                                                                                          SUBJECTIVE STATEMENT:  s/p R TKR  PERTINENT HISTORY:  Joanne Lewis, 77 y.o. female has a history of pain and functional disability in the right knee due to arthritis and has failed non-surgical conservative treatments for greater than 12 weeks to include corticosteriod injections, viscosupplementation injections, and activity modification. She reports to physical therapy today s/p right TKA on 08/16/2022. "Since the surgery, I've felt well except for yesterday. I woke up with significant pain in my right leg (8/10) and it kept me from walking or performing any exercise."  Pt also complains of severe swelling in RLE. Pt reports slight pain in hip right hip flexor with exercises prescribed from hospital.   PAIN:   Pain Intensity: Present: 0/10, Best: 0/10, Worst: 8/10 Pain location: Right Knee Pain quality: intermittent, incisional   Radiating pain: No  Swelling: Yes  Popping, catching, locking: No  Numbness/Tingling: No Focal weakness or buckling: No Aggravating factors: Post-Op Exercises, Straight Leg Raise  Relieving factors: Resting, Ice  24-hour pain behavior: AM: Sharp Pain PM: Movement Reduces the pain throughout the day History of prior back, hip, or knee injury, pain, surgery, or therapy: Yes Left TKA Dominant hand: right Imaging: Yes  Prior level of function: Independent with household mobility with device Two-Wheeled Walker  Occupational demands: Retired  Presenter, broadcasting: Social research officer, government, Walking on [employer] flags: Negative for personal history of cancer, chills/fever, and denies night sweats, nausea, vomiting, unexplained weight gain/loss, unrelenting pain  PRECAUTIONS: Knee  WEIGHT BEARING RESTRICTIONS: No  FALLS: Has patient fallen in last 6 months? No  Living Environment Lives with: lives alone Lives in: House/apartment Stairs: 13 Steps Left Rail going up    Patient Goals: "I would like to get stronger, build endurance and return to gardening."     OBJECTIVE:   Patient Surveys  FOTO 42, predicted to 34  Cognition Patient is oriented to person, place, and time.  Recent memory is intact.  Remote memory is intact.  Attention span and concentration are intact.  Expressive speech is intact.  Patient's fund of knowledge is within normal limits for educational level.    Gross Musculoskeletal Assessment Tremor: None Tone: Normal Severe swelling noted in R thigh and calf compared to LLE. PT measured lower extremity swelling 10cm inferior/superior to patella: R Inf: 43cm, L Inf: 37.5, R Sup: 59cm,  L Sup: 53cm.   GAIT: Distance walked: 3m  Assistive device utilized: Environmental consultant - 2 wheeled Level of assistance: Modified independence Comments: Patient presents with antalgic step through gait pattern. Decreased knee flexion during swing.  Posture:  AROM AROM (Normal range in degrees) AROM  Hip Right Left  Flexion (125) WNL WNL  Extension (15)    Abduction (40)    Adduction     Internal Rotation (45)    External Rotation (45)        Knee    Flexion (135) 96* 121  Extension (0) -14* (lacking) 8      Ankle    Dorsiflexion (20) WNL WNL  Plantarflexion (50) WNL WNL  Inversion (35) WNL WNL  Eversion (15 WNL   (* = pain; Blank rows = not tested)  LE MMT: MMT (out of 5) Right Left  Hip flexion    Hip extension    Hip abduction    Hip adduction    Hip internal rotation    Hip external rotation    Knee flexion    Knee extension    Ankle dorsiflexion 5 5  Ankle plantarflexion 5 5  Ankle inversion    Ankle eversion    (* = pain; Blank rows = not tested)  Sensation Grossly intact to light touch bilateral LEs as determined by testing dermatomes L2-S2. Proprioception, and hot/cold testing deferred on this date.  Reflexes Deferred  Muscle Length Deferred  Palpation Location LEFT  RIGHT           Quadriceps 0 0  Medial Hamstrings 0 2  Lateral Hamstrings 0 1  Lateral Hamstring tendon  1  Medial Hamstring tendon   1  Quadriceps tendon  Patella    Patellar Tendon    Tibial Tuberosity    Medial joint line    Lateral joint line    MCL    LCL    Adductor Tubercle    Pes Anserine tendon    Infrapatellar fat pad    Fibular head    Popliteal fossa 0 1  (Blank rows = not tested) Graded on 0-4 scale (0 = no pain, 1 = pain, 2 = pain with wincing/grimacing/flinching, 3 = pain with withdrawal, 4 = unwilling to allow palpation), (Blank rows = not tested)  Passive Accessory Motion Tibiofemoral: Deferred  Fibula on Femur: Deferred  Patellofemoral: Deferred   VASCULAR Dorsalis pedis and posterior tibial pulses are palpable  WELL Criteria for DVT:  4 Points - High Risk Factor for "Likely" DVT  Calf Swelling Greater than 3 cm compared to contralateral limb Localized Tenderness along the the deep venous system Entire Leg Swollen  Major Swelling within 12 weeks   TODAY'S TREATMENT: 08/27/2022  Subjective: Pt reports continued frustration/ pain since surgery. Pt. Presents with RLE swelling/ significant brusing.  Pt. Reports compliance with HEP but limited due to pain.  Pt. Has been icing frequently and trying to elevate R LE to manage swelling.    Objective:  Therex: Supine R quad sets: 1x15 - Verbal/tactile cues for correct form  Supine R Heel Slides/ marching: 10x  (pain limited AAROM).    Sitting LAQ on R: 2x15 - verbal/tactile cues for proper form, ensuring no compensation with hip flexors  Reviewed HEP  Manual: Supine Grade 1-2 R patellar mobilizations: superior/inferior + medial/lateral - x5 minutes  Supine R hip/knee AA/PROM 8x with static holds (as tolerated)  Supine R knee AROM (-12 to 74 deg.) Supine R knee PROM (-8 to 88 deg.).   Supine R distal quad/knee STM and assessment of swelling.   Supine R hamstring/ gastroc gentle stretches 4x each with holds.     PATIENT EDUCATION:  Education details: Plan of Care, HEP, Post-op Care  Person educated: Patient Education  method: Explanation Education comprehension: verbalized understanding   HOME EXERCISE PROGRAM:  Ankle Pumps - 7x a week - 1 Daily - 1-2 sets - 10 reps Straight Leg Raise - 7x a week - 1 Daily - 1-2 sets - 10 reps  Access Code: [email or web address] URL: [email or web address] Date: 08/27/2022 Prepared by: Dorene Grebe  Exercises - Active Straight Leg Raise with Quad Set  - 2 x daily - 7 x weekly - 2 sets - 10 reps - Supine Quad Set  - 2 x daily - 7 x weekly - 2 sets - 10 reps - Supine Heel Slide with Strap  - 2 x daily - 7 x weekly - 2 sets - 10 reps - Seated Long Arc Quad  - 2 x daily - 7 x weekly - 2 sets - 10 reps - Seated Knee Flexion Extension AAROM with Overpressure  - 2 x daily - 7 x weekly - 1 sets - 10 reps   ASSESSMENT:  CLINICAL IMPRESSION: Pt presents to PT with 6/10 R knee pain; still demonstrates significant swelling and bruising of the entire R LE. Session today consisted of gentle stretching of the R knee and exercises aimed to improve R knee ROM and RLE strength. Pt continues to be very pain limited and demonstrates decreased R knee flexion and extension. Pt educated on continued use of ice at home, as well as optimal positioning of RLE when at rest to promote decreased swelling and increased  knee extension. Pt will benefit from continued PT services upon discharge to safely address deficits listed in patient problem list for decreased caregiver assistance and eventual return to PLOF.   OBJECTIVE IMPAIRMENTS: Abnormal gait, decreased balance, decreased endurance, difficulty walking, decreased ROM, decreased strength, and pain.   ACTIVITY LIMITATIONS: bending, sitting, standing, squatting, and transfers  PARTICIPATION LIMITATIONS: community activity and yard work  PERSONAL FACTORS: Age, Past/current experiences, and 3+ comorbidities: CKD, Hyperkalemia, HLD, HTN , PAN  are also affecting patient's functional outcome.   REHAB POTENTIAL: Good  CLINICAL DECISION  MAKING: Stable/uncomplicated  EVALUATION COMPLEXITY: Moderate   GOALS: Goals reviewed with patient? Yes  SHORT TERM GOALS: Target date: 10/15/2022  Pt will be independent with HEP to improve strength and decrease knee pain to improve pain-free function at home and work. Baseline:  Goal status: INITIAL   LONG TERM GOALS: Target date: 11/12/2022  Pt will increase FOTO to at least 66 to demonstrate significant improvement in function at home and work related to knee pain  Baseline: 08/20/22: 53 Goal status: INITIAL  2.  Pt will decrease worst knee pain by at least 3 points on the NPRS in order to demonstrate clinically significant reduction in knee pain. Baseline: 08/09: 8/10 Goal status: INITIAL  3. Pt will be able to perform deep squat without pain in order to demonstrate improvement in function as she returns to gardening. Baseline:  Goal status: INITIAL  4. Pt will be able to achieve less than 5 degrees of knee extension and at least 115 degrees of knee flexion in order to normalize gait pattern and improve functional mobility.  Baseline: 08/09: -14 to 96 Goal status: INITIAL  PLAN: PT FREQUENCY: 1-2x/week  PT DURATION: 12 weeks  PLANNED INTERVENTIONS: Therapeutic exercises, Therapeutic activity, Neuromuscular re-education, Balance training, Gait training, Patient/Family education, Self Care, Joint mobilization, Joint manipulation, Vestibular training, Canalith repositioning, Orthotic/Fit training, DME instructions, Dry Needling, Electrical stimulation, Spinal manipulation, Spinal mobilization, Cryotherapy, Moist heat, Taping, Traction, Ultrasound, Ionotophoresis 4mg /ml Dexamethasone, Manual therapy, and Re-evaluation.  PLAN FOR NEXT SESSION: Progress R knee PROM/AROM exercises as tolerated, progress R LE strengthening as tolerated  Cammie Mcgee, PT, DPT # [id number] Physical Therapist - [facility]  08/27/22, 1:22 PM

## 2022-08-31 ENCOUNTER — Ambulatory Visit: Payer: Medicare Other | Admitting: Physical Therapy

## 2022-08-31 DIAGNOSIS — M6281 Muscle weakness (generalized): Secondary | ICD-10-CM | POA: Diagnosis not present

## 2022-08-31 DIAGNOSIS — M25661 Stiffness of right knee, not elsewhere classified: Secondary | ICD-10-CM

## 2022-08-31 DIAGNOSIS — R269 Unspecified abnormalities of gait and mobility: Secondary | ICD-10-CM

## 2022-08-31 DIAGNOSIS — Z96651 Presence of right artificial knee joint: Secondary | ICD-10-CM

## 2022-08-31 NOTE — Therapy (Signed)
OUTPATIENT PHYSICAL THERAPY KNEE TREATMENT  Patient Name: Joanne Lewis MRN: [id number] DOB:1945-06-23, 77 y.o., female Today's Date: 08/31/2022  END OF SESSION:  PT End of Session - 08/31/22 1102     Visit Number 4    Number of Visits 25    Date for PT Re-Evaluation 11/12/22    PT Start Time 1102    PT Stop Time 1200    PT Time Calculation (min) 58 min    Activity Tolerance Patient limited by pain    Behavior During Therapy [facility] for tasks assessed/performed            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HCC)    Arthritis    Branch retinal artery occlusion, left eye    Chronic kidney disease    moderate   Fatigue    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 11/25/2017   Hyperkalemia    Hyperlipidemia    Hypertension    Pneumonia    at least 10 years ago   Pre-diabetes    Patient Active Problem List   Diagnosis Date Noted   Osteoarthritis of right knee 08/16/2022   Osteoarthritis of knee 08/13/2020   OA (osteoarthritis) of knee 08/11/2020   Primary osteoarthritis of left knee 08/11/2020   SOB (shortness of breath) 10/10/2017   Coronary artery calcification 09/19/2016   Chest pain 02/03/2016   Elevated troponin 02/03/2016   Anxiety 02/03/2016   Essential hypertension 02/03/2016   Mixed hyperlipidemia 02/03/2016   Chest tightness 01/01/2016    PCP: Lauro Regulus, MD  REFERRING PROVIDER: Lauro Regulus, MD  REFERRING DIAG: s/p R TKA  RATIONALE FOR EVALUATION AND TREATMENT: Rehabilitation  THERAPY DIAG: Total knee replacement status, right  Muscle weakness (generalized)  Stiffness of right knee, not elsewhere classified  Gait difficulty  Knee joint stiffness, bilateral  ONSET DATE: 08/16/2022  FOLLOW-UP APPT SCHEDULED WITH REFERRING PROVIDER: Yes    SUBJECTIVE:                                                                                                                                                                                          SUBJECTIVE STATEMENT:  s/p R TKR  PERTINENT HISTORY:  Joanne Lewis, 77 y.o. female has a history of pain and functional disability in the right knee due to arthritis and has failed non-surgical conservative treatments for greater than 12 weeks to include corticosteriod injections, viscosupplementation injections, and activity modification. She reports to physical therapy today s/p right TKA on 08/16/2022. "Since the surgery, I've felt well except for yesterday. I woke up with significant  pain in my right leg (8/10) and it kept me from walking or performing any exercise." Pt also complains of severe swelling in RLE. Pt reports slight pain in hip right hip flexor with exercises prescribed from hospital.   PAIN:   Pain Intensity: Present: 0/10, Best: 0/10, Worst: 8/10 Pain location: Right Knee Pain quality: intermittent, incisional   Radiating pain: No  Swelling: Yes  Popping, catching, locking: No  Numbness/Tingling: No Focal weakness or buckling: No Aggravating factors: Post-Op Exercises, Straight Leg Raise  Relieving factors: Resting, Ice  24-hour pain behavior: AM: Sharp Pain PM: Movement Reduces the pain throughout the day History of prior back, hip, or knee injury, pain, surgery, or therapy: Yes Left TKA Dominant hand: right Imaging: Yes  Prior level of function: Independent with household mobility with device Two-Wheeled Walker  Occupational demands: Retired  Presenter, broadcasting: Social research officer, government, Walking on [employer] flags: Negative for personal history of cancer, chills/fever, and denies night sweats, nausea, vomiting, unexplained weight gain/loss, unrelenting pain  PRECAUTIONS: Knee  WEIGHT BEARING RESTRICTIONS: No  FALLS: Has patient fallen in last 6 months? No  Living Environment Lives with: lives alone Lives in: House/apartment Stairs: 13 Steps Left Rail going up    Patient Goals: "I would like to get stronger,  build endurance and return to gardening."    OBJECTIVE:   Patient Surveys  FOTO 3, predicted to 80  Cognition Patient is oriented to person, place, and time.  Recent memory is intact.  Remote memory is intact.  Attention span and concentration are intact.  Expressive speech is intact.  Patient's fund of knowledge is within normal limits for educational level.    Gross Musculoskeletal Assessment Tremor: None Tone: Normal Severe swelling noted in R thigh and calf compared to LLE. PT measured lower extremity swelling 10cm inferior/superior to patella: R Inf: 43cm, L Inf: 37.5, R Sup: 59cm,  L Sup: 53cm.   GAIT: Distance walked: 68m  Assistive device utilized: Environmental consultant - 2 wheeled Level of assistance: Modified independence Comments: Patient presents with antalgic step through gait pattern. Decreased knee flexion during swing.  Posture:  AROM AROM (Normal range in degrees) AROM  Hip Right Left  Flexion (125) WNL WNL  Extension (15)    Abduction (40)    Adduction     Internal Rotation (45)    External Rotation (45)        Knee    Flexion (135) 96* 121  Extension (0) -14* (lacking) 8      Ankle    Dorsiflexion (20) WNL WNL  Plantarflexion (50) WNL WNL  Inversion (35) WNL WNL  Eversion (15 WNL   (* = pain; Blank rows = not tested)  LE MMT: MMT (out of 5) Right Left  Hip flexion    Hip extension    Hip abduction    Hip adduction    Hip internal rotation    Hip external rotation    Knee flexion    Knee extension    Ankle dorsiflexion 5 5  Ankle plantarflexion 5 5  Ankle inversion    Ankle eversion    (* = pain; Blank rows = not tested)  Sensation Grossly intact to light touch bilateral LEs as determined by testing dermatomes L2-S2. Proprioception, and hot/cold testing deferred on this date.  Reflexes Deferred  Muscle Length Deferred  Palpation Location LEFT  RIGHT           Quadriceps 0 0  Medial Hamstrings 0 2  Lateral Hamstrings 0 1  Lateral  Hamstring tendon  1  Medial Hamstring tendon  1  Quadriceps tendon    Patella    Patellar Tendon    Tibial Tuberosity    Medial joint line    Lateral joint line    MCL    LCL    Adductor Tubercle    Pes Anserine tendon    Infrapatellar fat pad    Fibular head    Popliteal fossa 0 1  (Blank rows = not tested) Graded on 0-4 scale (0 = no pain, 1 = pain, 2 = pain with wincing/grimacing/flinching, 3 = pain with withdrawal, 4 = unwilling to allow palpation), (Blank rows = not tested)  Passive Accessory Motion Tibiofemoral: Deferred  Fibula on Femur: Deferred  Patellofemoral: Deferred   VASCULAR Dorsalis pedis and posterior tibial pulses are palpable  WELL Criteria for DVT:  4 Points - High Risk Factor for "Likely" DVT  Calf Swelling Greater than 3 cm compared to contralateral limb Localized Tenderness along the the deep venous system Entire Leg Swollen  Major Swelling within 12 weeks  8/16: Supine R knee AROM (-12 to 74 deg.) Supine R knee PROM (-8 to 88 deg.).     TODAY'S TREATMENT: 08/31/2022  Subjective:  Pt. Reports 4-5/10 R knee pain prior to tx. Session.  Pt. States she did a little better this weekend as compared to last Friday.  Pt. Has been walking frequently with use of RW around house.  Improvement noted in bruising swelling since last PT tx. Session.    Objective:  Therex: Supine R quad sets: 10x with verbal/tactile cues for correct form.  Supine R Heel Slides/ marching: 10x  (pain limited AAROM).    Supine knee to chest with green ball 10x (cuing for proper hip/knee flexion).  Supine bolster SAQ/ bridging 10x2 each.  Pt. States pain in R knee is 2.5/10 (slight decrease since last tx.)  Sitting LAQ on R: 2x15 - verbal/tactile cues for proper form, ensuring no compensation with hip flexors  12" step R knee lunges with holds 5x.    Walking in //-bars with light UE assist/ progressing to L UE only.  Check SPC use next tx. Session.    Reviewed  HEP  Manual: Supine Grade II-III R patellar mobilizations: superior/inferior + medial/lateral - x5 minutes  Supine R hip/knee AA/PROM 10x with static holds (as tolerated)  Supine R distal quad/knee STM and reassessment of swelling.   Supine R hamstring/ gastroc gentle stretches 4x each with holds.    Pt. Will ice at home.     PATIENT EDUCATION:  Education details: Plan of Care, HEP, Post-op Care  Person educated: Patient Education method: Explanation Education comprehension: verbalized understanding   HOME EXERCISE PROGRAM:  Ankle Pumps - 7x a week - 1 Daily - 1-2 sets - 10 reps Straight Leg Raise - 7x a week - 1 Daily - 1-2 sets - 10 reps  Access Code: [email or web address] URL: [email or web address] Date: 08/27/2022 Prepared by: Dorene Grebe  Exercises - Active Straight Leg Raise with Quad Set  - 2 x daily - 7 x weekly - 2 sets - 10 reps - Supine Quad Set  - 2 x daily - 7 x weekly - 2 sets - 10 reps - Supine Heel Slide with Strap  - 2 x daily - 7 x weekly - 2 sets - 10 reps - Seated Long Arc Quad  - 2 x daily - 7 x weekly - 2 sets - 10 reps - Seated Knee Flexion Extension AAROM with  Overpressure  - 2 x daily - 7 x weekly - 1 sets - 10 reps   ASSESSMENT:  CLINICAL IMPRESSION: Pt presents to PT with 4-5/10 R knee pain; still demonstrates moderate swelling and bruising of the entire R LE (posterior aspect).  Pt continues to be very pain limited and demonstrates limited R knee flexion and extension.  Pt. Progressing to use of SPC by next tx. Session and will focus on knee ROM with HEP.  Pt. Returns to MD this afternoon to check knee.  Pt educated on continued use of ice at home, as well as optimal positioning of RLE when at rest to promote decreased swelling and increased knee extension. Pt will benefit from continued PT services upon discharge to safely address deficits listed in patient problem list for decreased caregiver assistance and eventual return to  PLOF.   OBJECTIVE IMPAIRMENTS: Abnormal gait, decreased balance, decreased endurance, difficulty walking, decreased ROM, decreased strength, and pain.   ACTIVITY LIMITATIONS: bending, sitting, standing, squatting, and transfers  PARTICIPATION LIMITATIONS: community activity and yard work  PERSONAL FACTORS: Age, Past/current experiences, and 3+ comorbidities: CKD, Hyperkalemia, HLD, HTN , PAN  are also affecting patient's functional outcome.   REHAB POTENTIAL: Good  CLINICAL DECISION MAKING: Stable/uncomplicated  EVALUATION COMPLEXITY: Moderate   GOALS: Goals reviewed with patient? Yes  SHORT TERM GOALS: Target date: 10/15/2022  Pt will be independent with HEP to improve strength and decrease knee pain to improve pain-free function at home and work. Baseline:  Goal status: INITIAL   LONG TERM GOALS: Target date: 11/12/2022  Pt will increase FOTO to at least 66 to demonstrate significant improvement in function at home and work related to knee pain  Baseline: 08/20/22: 53 Goal status: INITIAL  2.  Pt will decrease worst knee pain by at least 3 points on the NPRS in order to demonstrate clinically significant reduction in knee pain. Baseline: 08/09: 8/10 Goal status: INITIAL  3. Pt will be able to perform deep squat without pain in order to demonstrate improvement in function as she returns to gardening. Baseline:  Goal status: INITIAL  4. Pt will be able to achieve less than 5 degrees of knee extension and at least 115 degrees of knee flexion in order to normalize gait pattern and improve functional mobility.  Baseline: 08/09: -14 to 96 Goal status: INITIAL  PLAN: PT FREQUENCY: 1-2x/week  PT DURATION: 12 weeks  PLANNED INTERVENTIONS: Therapeutic exercises, Therapeutic activity, Neuromuscular re-education, Balance training, Gait training, Patient/Family education, Self Care, Joint mobilization, Joint manipulation, Vestibular training, Canalith repositioning, Orthotic/Fit  training, DME instructions, Dry Needling, Electrical stimulation, Spinal manipulation, Spinal mobilization, Cryotherapy, Moist heat, Taping, Traction, Ultrasound, Ionotophoresis 4mg /ml Dexamethasone, Manual therapy, and Re-evaluation.  PLAN FOR NEXT SESSION: Progress R knee PROM/AROM exercises as tolerated, progress R LE strengthening as tolerated.  Use SPC/ discuss MD f/u.   Cammie Mcgee, PT, DPT # [phone] Physical Therapist - [facility]  08/31/22, 5:52 PM

## 2022-09-02 ENCOUNTER — Encounter: Payer: Medicare Other | Admitting: Physical Therapy

## 2022-09-03 ENCOUNTER — Ambulatory Visit: Payer: Medicare Other | Admitting: Physical Therapy

## 2022-09-03 DIAGNOSIS — R269 Unspecified abnormalities of gait and mobility: Secondary | ICD-10-CM

## 2022-09-03 DIAGNOSIS — Z96651 Presence of right artificial knee joint: Secondary | ICD-10-CM

## 2022-09-03 DIAGNOSIS — M6281 Muscle weakness (generalized): Secondary | ICD-10-CM | POA: Diagnosis not present

## 2022-09-03 DIAGNOSIS — M25661 Stiffness of right knee, not elsewhere classified: Secondary | ICD-10-CM

## 2022-09-03 NOTE — Therapy (Signed)
OUTPATIENT PHYSICAL THERAPY KNEE TREATMENT  Patient Name: Joanne Lewis MRN: [id number] DOB:06-22-1945, 77 y.o., female Today's Date: 09/03/2022  END OF SESSION:  PT End of Session - 09/03/22 1312     Visit Number 5    Number of Visits 25    Date for PT Re-Evaluation 11/12/22    PT Start Time 1107    PT Stop Time 1200    PT Time Calculation (min) 53 min    Activity Tolerance Patient limited by pain    Behavior During Therapy [facility] for tasks assessed/performed            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HCC)    Arthritis    Branch retinal artery occlusion, left eye    Chronic kidney disease    moderate   Fatigue    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 11/25/2017   Hyperkalemia    Hyperlipidemia    Hypertension    Pneumonia    at least 10 years ago   Pre-diabetes    Patient Active Problem List   Diagnosis Date Noted   Osteoarthritis of right knee 08/16/2022   Osteoarthritis of knee 08/13/2020   OA (osteoarthritis) of knee 08/11/2020   Primary osteoarthritis of left knee 08/11/2020   SOB (shortness of breath) 10/10/2017   Coronary artery calcification 09/19/2016   Chest pain 02/03/2016   Elevated troponin 02/03/2016   Anxiety 02/03/2016   Essential hypertension 02/03/2016   Mixed hyperlipidemia 02/03/2016   Chest tightness 01/01/2016    PCP: Lauro Regulus, MD  REFERRING PROVIDER: Lauro Regulus, MD  REFERRING DIAG: s/p R TKA  RATIONALE FOR EVALUATION AND TREATMENT: Rehabilitation  THERAPY DIAG: Total knee replacement status, right  Muscle weakness (generalized)  Stiffness of right knee, not elsewhere classified  Gait difficulty  ONSET DATE: 08/16/2022  FOLLOW-UP APPT SCHEDULED WITH REFERRING PROVIDER: Yes    SUBJECTIVE:                                                                                                                                                                                          SUBJECTIVE STATEMENT:  s/p R TKR  PERTINENT HISTORY:  Joanne Lewis, 77 y.o. female has a history of pain and functional disability in the right knee due to arthritis and has failed non-surgical conservative treatments for greater than 12 weeks to include corticosteriod injections, viscosupplementation injections, and activity modification. She reports to physical therapy today s/p right TKA on 08/16/2022. "Since the surgery, I've felt well except for yesterday. I woke up with significant pain in my right leg (  8/10) and it kept me from walking or performing any exercise." Pt also complains of severe swelling in RLE. Pt reports slight pain in hip right hip flexor with exercises prescribed from hospital.   PAIN:   Pain Intensity: Present: 0/10, Best: 0/10, Worst: 8/10 Pain location: Right Knee Pain quality: intermittent, incisional   Radiating pain: No  Swelling: Yes  Popping, catching, locking: No  Numbness/Tingling: No Focal weakness or buckling: No Aggravating factors: Post-Op Exercises, Straight Leg Raise  Relieving factors: Resting, Ice  24-hour pain behavior: AM: Sharp Pain PM: Movement Reduces the pain throughout the day History of prior back, hip, or knee injury, pain, surgery, or therapy: Yes Left TKA Dominant hand: right Imaging: Yes  Prior level of function: Independent with household mobility with device Two-Wheeled Walker  Occupational demands: Retired  Presenter, broadcasting: Social research officer, government, Walking on [employer] flags: Negative for personal history of cancer, chills/fever, and denies night sweats, nausea, vomiting, unexplained weight gain/loss, unrelenting pain  PRECAUTIONS: Knee  WEIGHT BEARING RESTRICTIONS: No  FALLS: Has patient fallen in last 6 months? No  Living Environment Lives with: lives alone Lives in: House/apartment Stairs: 13 Steps Left Rail going up    Patient Goals: "I would like to get stronger, build endurance and return to  gardening."    OBJECTIVE:   Patient Surveys  FOTO 47, predicted to 41  Cognition Patient is oriented to person, place, and time.  Recent memory is intact.  Remote memory is intact.  Attention span and concentration are intact.  Expressive speech is intact.  Patient's fund of knowledge is within normal limits for educational level.    Gross Musculoskeletal Assessment Tremor: None Tone: Normal Severe swelling noted in R thigh and calf compared to LLE. PT measured lower extremity swelling 10cm inferior/superior to patella: R Inf: 43cm, L Inf: 37.5, R Sup: 59cm,  L Sup: 53cm.   GAIT: Distance walked: 101m  Assistive device utilized: Environmental consultant - 2 wheeled Level of assistance: Modified independence Comments: Patient presents with antalgic step through gait pattern. Decreased knee flexion during swing.  Posture:  AROM AROM (Normal range in degrees) AROM  Hip Right Left  Flexion (125) WNL WNL  Extension (15)    Abduction (40)    Adduction     Internal Rotation (45)    External Rotation (45)        Knee    Flexion (135) 96* 121  Extension (0) -14* (lacking) 8      Ankle    Dorsiflexion (20) WNL WNL  Plantarflexion (50) WNL WNL  Inversion (35) WNL WNL  Eversion (15 WNL   (* = pain; Blank rows = not tested)  LE MMT: MMT (out of 5) Right Left  Hip flexion    Hip extension    Hip abduction    Hip adduction    Hip internal rotation    Hip external rotation    Knee flexion    Knee extension    Ankle dorsiflexion 5 5  Ankle plantarflexion 5 5  Ankle inversion    Ankle eversion    (* = pain; Blank rows = not tested)  Sensation Grossly intact to light touch bilateral LEs as determined by testing dermatomes L2-S2. Proprioception, and hot/cold testing deferred on this date.  Reflexes Deferred  Muscle Length Deferred  Palpation Location LEFT  RIGHT           Quadriceps 0 0  Medial Hamstrings 0 2  Lateral Hamstrings 0 1  Lateral Hamstring tendon  1  Medial  Hamstring tendon  1  Quadriceps tendon    Patella    Patellar Tendon    Tibial Tuberosity    Medial joint line    Lateral joint line    MCL    LCL    Adductor Tubercle    Pes Anserine tendon    Infrapatellar fat pad    Fibular head    Popliteal fossa 0 1  (Blank rows = not tested) Graded on 0-4 scale (0 = no pain, 1 = pain, 2 = pain with wincing/grimacing/flinching, 3 = pain with withdrawal, 4 = unwilling to allow palpation), (Blank rows = not tested)  Passive Accessory Motion Tibiofemoral: Deferred  Fibula on Femur: Deferred  Patellofemoral: Deferred   VASCULAR Dorsalis pedis and posterior tibial pulses are palpable  WELL Criteria for DVT:  4 Points - High Risk Factor for "Likely" DVT  Calf Swelling Greater than 3 cm compared to contralateral limb Localized Tenderness along the the deep venous system Entire Leg Swollen  Major Swelling within 12 weeks  8/16: Supine R knee AROM (-12 to 74 deg.) Supine R knee PROM (-8 to 88 deg.).     TODAY'S TREATMENT: 09/03/2022  Subjective:  Pt. Had MD appt. To remove bandage and assess healing.  Pt. Presents with redness/ swelling in R posterior aspect of knee.  R knee/lower leg swelling and ecchymosis present (improving).  Pt. Entered PT with use of RW and PT discussed discharging RW and progress to SPC/ no assistive device.      Objective:  Therex: Supine R quad sets/ marching/ SLR: 10x with verbal/tactile cues for correct form.    Sitting LAQ/ marching/ heel raises on R: 2x15 - verbal/tactile cues for proper form, ensuring no compensation with hip flexors  12" step R and L knee lunges with holds 5x.    Walking in //-bars with light UE assist/ progressing to L UE only.  Forward/ lateral with cuing for upright posture.    Nustep seat 8-6 L3 for 10 min. (Focus on R knee in midline to increase flexion)- B UE/LE with average of 40 SPM.     Sit to stands from gray chair with light UE assist on arm rests with R knee flexion/  equal LE wt. Bearing.    Amb. Outside with use of SPC and consistent 2-point gait pattern and progressing to no assistive device by end of tx. Session.  Discharge use of RW.    Reviewed HEP  Manual: Supine Grade II-III R patellar mobilizations: superior/inferior + medial/lateral - x5 minutes  Supine R hip/knee AA/PROM 5x with static holds (as tolerated)  Supine R distal quad/knee STM and reassessment of swelling/ incision healing.   Supine R hamstring/ gastroc gentle stretches 4x each with holds.    Pt. Will ice at home.     PATIENT EDUCATION:  Education details: Plan of Care, HEP, Post-op Care  Person educated: Patient Education method: Explanation Education comprehension: verbalized understanding   HOME EXERCISE PROGRAM:  Ankle Pumps - 7x a week - 1 Daily - 1-2 sets - 10 reps Straight Leg Raise - 7x a week - 1 Daily - 1-2 sets - 10 reps  Access Code: [email or web address] URL: [email or web address] Date: 08/27/2022 Prepared by: Dorene Grebe  Exercises - Active Straight Leg Raise with Quad Set  - 2 x daily - 7 x weekly - 2 sets - 10 reps - Supine Quad Set  - 2 x daily - 7 x weekly - 2 sets - 10 reps - Supine  Heel Slide with Strap  - 2 x daily - 7 x weekly - 2 sets - 10 reps - Seated Long Arc Quad  - 2 x daily - 7 x weekly - 2 sets - 10 reps - Seated Knee Flexion Extension AAROM with Overpressure  - 2 x daily - 7 x weekly - 1 sets - 10 reps   ASSESSMENT:  CLINICAL IMPRESSION: Pt progressing well with R knee/hip AROM but remains pain limited with knee flexion >90 deg.  Good incision healing with skin glue/ scabs in place.  Mild warmth, redness over incision with good incision healing.  Pt. Has progressed to ambulating with no assistive device on level surfaces safely and may benefit from use of SPC on outside/grassy terrain.  Pt will benefit from continued PT services upon discharge to safely address deficits listed in patient problem list for decreased caregiver  assistance and eventual return to PLOF.   OBJECTIVE IMPAIRMENTS: Abnormal gait, decreased balance, decreased endurance, difficulty walking, decreased ROM, decreased strength, and pain.   ACTIVITY LIMITATIONS: bending, sitting, standing, squatting, and transfers  PARTICIPATION LIMITATIONS: community activity and yard work  PERSONAL FACTORS: Age, Past/current experiences, and 3+ comorbidities: CKD, Hyperkalemia, HLD, HTN , PAN  are also affecting patient's functional outcome.   REHAB POTENTIAL: Good  CLINICAL DECISION MAKING: Stable/uncomplicated  EVALUATION COMPLEXITY: Moderate   GOALS: Goals reviewed with patient? Yes  SHORT TERM GOALS: Target date: 10/15/2022  Pt will be independent with HEP to improve strength and decrease knee pain to improve pain-free function at home and work. Baseline:  Goal status: INITIAL   LONG TERM GOALS: Target date: 11/12/2022  Pt will increase FOTO to at least 66 to demonstrate significant improvement in function at home and work related to knee pain  Baseline: 08/20/22: 53 Goal status: INITIAL  2.  Pt will decrease worst knee pain by at least 3 points on the NPRS in order to demonstrate clinically significant reduction in knee pain. Baseline: 08/09: 8/10 Goal status: INITIAL  3. Pt will be able to perform deep squat without pain in order to demonstrate improvement in function as she returns to gardening. Baseline:  Goal status: INITIAL  4. Pt will be able to achieve less than 5 degrees of knee extension and at least 115 degrees of knee flexion in order to normalize gait pattern and improve functional mobility.  Baseline: 08/09: -14 to 96 Goal status: INITIAL  PLAN: PT FREQUENCY: 1-2x/week  PT DURATION: 12 weeks  PLANNED INTERVENTIONS: Therapeutic exercises, Therapeutic activity, Neuromuscular re-education, Balance training, Gait training, Patient/Family education, Self Care, Joint mobilization, Joint manipulation, Vestibular training,  Canalith repositioning, Orthotic/Fit training, DME instructions, Dry Needling, Electrical stimulation, Spinal manipulation, Spinal mobilization, Cryotherapy, Moist heat, Taping, Traction, Ultrasound, Ionotophoresis 4mg /ml Dexamethasone, Manual therapy, and Re-evaluation.  PLAN FOR NEXT SESSION: Progress R knee PROM/AROM exercises as tolerated, progress R LE strengthening as tolerated.  Check knee AROM.    Cammie Mcgee, PT, DPT # [phone] Physical Therapist - [facility]  09/03/22, 1:14 PM

## 2022-09-07 ENCOUNTER — Ambulatory Visit: Payer: Medicare Other | Admitting: Physical Therapy

## 2022-09-07 ENCOUNTER — Encounter: Payer: Self-pay | Admitting: Physical Therapy

## 2022-09-07 DIAGNOSIS — M25661 Stiffness of right knee, not elsewhere classified: Secondary | ICD-10-CM

## 2022-09-07 DIAGNOSIS — M6281 Muscle weakness (generalized): Secondary | ICD-10-CM | POA: Diagnosis not present

## 2022-09-07 DIAGNOSIS — R269 Unspecified abnormalities of gait and mobility: Secondary | ICD-10-CM

## 2022-09-07 DIAGNOSIS — Z96651 Presence of right artificial knee joint: Secondary | ICD-10-CM

## 2022-09-07 NOTE — Therapy (Signed)
OUTPATIENT PHYSICAL THERAPY KNEE TREATMENT  Patient Name: Joanne Lewis MRN: [id number] DOB:10-17-1945, 77 y.o., female Today's Date: 09/07/2022  END OF SESSION:  PT End of Session - 09/07/22 0753     Visit Number 6    Number of Visits 25    Date for PT Re-Evaluation 11/12/22    PT Start Time 0816    PT Stop Time 0906    PT Time Calculation (min) 50 min    Activity Tolerance Patient limited by pain    Behavior During Therapy [facility] for tasks assessed/performed            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HCC)    Arthritis    Branch retinal artery occlusion, left eye    Chronic kidney disease    moderate   Fatigue    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 11/25/2017   Hyperkalemia    Hyperlipidemia    Hypertension    Pneumonia    at least 10 years ago   Pre-diabetes    Patient Active Problem List   Diagnosis Date Noted   Osteoarthritis of right knee 08/16/2022   Osteoarthritis of knee 08/13/2020   OA (osteoarthritis) of knee 08/11/2020   Primary osteoarthritis of left knee 08/11/2020   SOB (shortness of breath) 10/10/2017   Coronary artery calcification 09/19/2016   Chest pain 02/03/2016   Elevated troponin 02/03/2016   Anxiety 02/03/2016   Essential hypertension 02/03/2016   Mixed hyperlipidemia 02/03/2016   Chest tightness 01/01/2016    PCP: Lauro Regulus, MD  REFERRING PROVIDER: Lauro Regulus, MD  REFERRING DIAG: s/p R TKA  RATIONALE FOR EVALUATION AND TREATMENT: Rehabilitation  THERAPY DIAG: Total knee replacement status, right  Muscle weakness (generalized)  Stiffness of right knee, not elsewhere classified  Gait difficulty  ONSET DATE: 08/16/2022  FOLLOW-UP APPT SCHEDULED WITH REFERRING PROVIDER: Yes    SUBJECTIVE:                                                                                                                                                                                          SUBJECTIVE STATEMENT:  s/p R TKR  PERTINENT HISTORY:  Joanne Lewis, 77 y.o. female has a history of pain and functional disability in the right knee due to arthritis and has failed non-surgical conservative treatments for greater than 12 weeks to include corticosteriod injections, viscosupplementation injections, and activity modification. She reports to physical therapy today s/p right TKA on 08/16/2022. "Since the surgery, I've felt well except for yesterday. I woke up with significant pain in my right leg (  8/10) and it kept me from walking or performing any exercise." Pt also complains of severe swelling in RLE. Pt reports slight pain in hip right hip flexor with exercises prescribed from hospital.   PAIN:   Pain Intensity: Present: 0/10, Best: 0/10, Worst: 8/10 Pain location: Right Knee Pain quality: intermittent, incisional   Radiating pain: No  Swelling: Yes  Popping, catching, locking: No  Numbness/Tingling: No Focal weakness or buckling: No Aggravating factors: Post-Op Exercises, Straight Leg Raise  Relieving factors: Resting, Ice  24-hour pain behavior: AM: Sharp Pain PM: Movement Reduces the pain throughout the day History of prior back, hip, or knee injury, pain, surgery, or therapy: Yes Left TKA Dominant hand: right Imaging: Yes  Prior level of function: Independent with household mobility with device Two-Wheeled Walker  Occupational demands: Retired  Presenter, broadcasting: Social research officer, government, Walking on [employer] flags: Negative for personal history of cancer, chills/fever, and denies night sweats, nausea, vomiting, unexplained weight gain/loss, unrelenting pain  PRECAUTIONS: Knee  WEIGHT BEARING RESTRICTIONS: No  FALLS: Has patient fallen in last 6 months? No  Living Environment Lives with: lives alone Lives in: House/apartment Stairs: 13 Steps Left Rail going up    Patient Goals: "I would like to get stronger, build endurance and return to  gardening."    OBJECTIVE:   Patient Surveys  FOTO 70, predicted to 84  Cognition Patient is oriented to person, place, and time.  Recent memory is intact.  Remote memory is intact.  Attention span and concentration are intact.  Expressive speech is intact.  Patient's fund of knowledge is within normal limits for educational level.    Gross Musculoskeletal Assessment Tremor: None Tone: Normal Severe swelling noted in R thigh and calf compared to LLE. PT measured lower extremity swelling 10cm inferior/superior to patella: R Inf: 43cm, L Inf: 37.5, R Sup: 59cm,  L Sup: 53cm.   GAIT: Distance walked: 48m  Assistive device utilized: Environmental consultant - 2 wheeled Level of assistance: Modified independence Comments: Patient presents with antalgic step through gait pattern. Decreased knee flexion during swing.  Posture:  AROM AROM (Normal range in degrees) AROM  Hip Right Left  Flexion (125) WNL WNL  Extension (15)    Abduction (40)    Adduction     Internal Rotation (45)    External Rotation (45)        Knee    Flexion (135) 96* 121  Extension (0) -14* (lacking) 8      Ankle    Dorsiflexion (20) WNL WNL  Plantarflexion (50) WNL WNL  Inversion (35) WNL WNL  Eversion (15 WNL   (* = pain; Blank rows = not tested)  LE MMT: MMT (out of 5) Right Left  Hip flexion    Hip extension    Hip abduction    Hip adduction    Hip internal rotation    Hip external rotation    Knee flexion    Knee extension    Ankle dorsiflexion 5 5  Ankle plantarflexion 5 5  Ankle inversion    Ankle eversion    (* = pain; Blank rows = not tested)  Sensation Grossly intact to light touch bilateral LEs as determined by testing dermatomes L2-S2. Proprioception, and hot/cold testing deferred on this date.  Reflexes Deferred  Muscle Length Deferred  Palpation Location LEFT  RIGHT           Quadriceps 0 0  Medial Hamstrings 0 2  Lateral Hamstrings 0 1  Lateral Hamstring tendon  1  Medial  Hamstring tendon  1  Quadriceps tendon    Patella    Patellar Tendon    Tibial Tuberosity    Medial joint line    Lateral joint line    MCL    LCL    Adductor Tubercle    Pes Anserine tendon    Infrapatellar fat pad    Fibular head    Popliteal fossa 0 1  (Blank rows = not tested) Graded on 0-4 scale (0 = no pain, 1 = pain, 2 = pain with wincing/grimacing/flinching, 3 = pain with withdrawal, 4 = unwilling to allow palpation), (Blank rows = not tested)  Passive Accessory Motion Tibiofemoral: Deferred  Fibula on Femur: Deferred  Patellofemoral: Deferred  VASCULAR Dorsalis pedis and posterior tibial pulses are palpable  WELL Criteria for DVT:  4 Points - High Risk Factor for "Likely" DVT  Calf Swelling Greater than 3 cm compared to contralateral limb Localized Tenderness along the the deep venous system Entire Leg Swollen  Major Swelling within 12 weeks  8/16: Supine R knee AROM (-12 to 74 deg.) Supine R knee PROM (-8 to 88 deg.).     TODAY'S TREATMENT: 09/07/2022  Subjective:  Pt. Reports 4/10 R knee pain prior to tx. Session.  R knee/lower leg swelling and ecchymosis present (improving).  Pt. Entered PT with use of SPC and no issues.  Pt. Took 1 Tramadol this morning prior to tx. Session.    Objective:  Therex:  Nustep seat 7-6 L4 for 10 min. (Focus on R knee in midline to increase flexion)- B LE only with average of 40+ SPM.    Supine R quad sets/ marching/ SLR: 10x with verbal/tactile cues for correct form.    Sitting LAQ/ marching/ heel raises on R: 2x15 - verbal/tactile cues for proper form.  12" step R and L knee lunges with holds 5x.    Walking in hallway with no UE assist working on consistent step pattern/ BOS.     Reviewed HEP  Manual:  Supine R hip/knee AA/PROM 6x with static holds (as tolerated)  Seated MET to increase R knee flexion 5x 5 sec. Holds with OP.  Pain tolerable.    Supine R hamstring/ gastroc gentle stretches 3x each with  holds.   Supine Grade II-III R patellar mobilizations: superior/inferior + medial/lateral - x5 minutes (increase patellar mobility today due to less swelling in joint).    Supine R distal quad/knee STM (tenderness over medial aspect).  Scar massage with good incision healing noted.    Pt. Will ice at home.     PATIENT EDUCATION:  Education details: Plan of Care, HEP, Post-op Care  Person educated: Patient Education method: Explanation Education comprehension: verbalized understanding   HOME EXERCISE PROGRAM:  Ankle Pumps - 7x a week - 1 Daily - 1-2 sets - 10 reps Straight Leg Raise - 7x a week - 1 Daily - 1-2 sets - 10 reps  Access Code: [email or web address] URL: [email or web address] Date: 08/27/2022 Prepared by: Dorene Grebe  Exercises - Active Straight Leg Raise with Quad Set  - 2 x daily - 7 x weekly - 2 sets - 10 reps - Supine Quad Set  - 2 x daily - 7 x weekly - 2 sets - 10 reps - Supine Heel Slide with Strap  - 2 x daily - 7 x weekly - 2 sets - 10 reps - Seated Long Arc Quad  - 2 x daily - 7 x weekly - 2 sets - 10 reps -  Seated Knee Flexion Extension AAROM with Overpressure  - 2 x daily - 7 x weekly - 1 sets - 10 reps   ASSESSMENT:  CLINICAL IMPRESSION: Pt progressing well with R knee/hip AROM but remains pain limited with knee flexion >90 deg.    Mild warmth, redness over incision with good incision healing.  Pt. Has progressed to ambulating with no assistive device on level surfaces safely and may benefit from use of SPC on outside/grassy terrain.  Pt will benefit from continued PT services upon discharge to safely address deficits listed in patient problem list for decreased caregiver assistance and eventual return to PLOF.   OBJECTIVE IMPAIRMENTS: Abnormal gait, decreased balance, decreased endurance, difficulty walking, decreased ROM, decreased strength, and pain.   ACTIVITY LIMITATIONS: bending, sitting, standing, squatting, and transfers  PARTICIPATION  LIMITATIONS: community activity and yard work  PERSONAL FACTORS: Age, Past/current experiences, and 3+ comorbidities: CKD, Hyperkalemia, HLD, HTN , PAN  are also affecting patient's functional outcome.   REHAB POTENTIAL: Good  CLINICAL DECISION MAKING: Stable/uncomplicated  EVALUATION COMPLEXITY: Moderate   GOALS: Goals reviewed with patient? Yes  SHORT TERM GOALS: Target date: 10/15/2022  Pt will be independent with HEP to improve strength and decrease knee pain to improve pain-free function at home and work. Baseline:  Goal status: INITIAL   LONG TERM GOALS: Target date: 11/12/2022  Pt will increase FOTO to at least 66 to demonstrate significant improvement in function at home and work related to knee pain  Baseline: 08/20/22: 53 Goal status: INITIAL  2.  Pt will decrease worst knee pain by at least 3 points on the NPRS in order to demonstrate clinically significant reduction in knee pain. Baseline: 08/09: 8/10 Goal status: INITIAL  3. Pt will be able to perform deep squat without pain in order to demonstrate improvement in function as she returns to gardening. Baseline:  Goal status: INITIAL  4. Pt will be able to achieve less than 5 degrees of knee extension and at least 115 degrees of knee flexion in order to normalize gait pattern and improve functional mobility.  Baseline: 08/09: -14 to 96 Goal status: INITIAL  PLAN: PT FREQUENCY: 1-2x/week  PT DURATION: 12 weeks  PLANNED INTERVENTIONS: Therapeutic exercises, Therapeutic activity, Neuromuscular re-education, Balance training, Gait training, Patient/Family education, Self Care, Joint mobilization, Joint manipulation, Vestibular training, Canalith repositioning, Orthotic/Fit training, DME instructions, Dry Needling, Electrical stimulation, Spinal manipulation, Spinal mobilization, Cryotherapy, Moist heat, Taping, Traction, Ultrasound, Ionotophoresis 4mg /ml Dexamethasone, Manual therapy, and Re-evaluation.  PLAN FOR NEXT  SESSION: Progress R knee PROM/AROM exercises as tolerated, progress R LE strengthening as tolerated.  Check knee AROM.    Cammie Mcgee, PT, DPT # [phone] Physical Therapist - [facility]  09/07/22, 7:15 PM

## 2022-09-09 ENCOUNTER — Ambulatory Visit: Payer: Medicare Other | Admitting: Physical Therapy

## 2022-09-09 ENCOUNTER — Encounter: Payer: Self-pay | Admitting: Physical Therapy

## 2022-09-09 DIAGNOSIS — M6281 Muscle weakness (generalized): Secondary | ICD-10-CM | POA: Diagnosis not present

## 2022-09-09 DIAGNOSIS — M25661 Stiffness of right knee, not elsewhere classified: Secondary | ICD-10-CM

## 2022-09-09 DIAGNOSIS — R269 Unspecified abnormalities of gait and mobility: Secondary | ICD-10-CM

## 2022-09-09 DIAGNOSIS — Z96651 Presence of right artificial knee joint: Secondary | ICD-10-CM

## 2022-09-09 NOTE — Therapy (Signed)
OUTPATIENT PHYSICAL THERAPY KNEE TREATMENT  Patient Name: Joanne Lewis MRN: [id number] DOB:[birth year], 77 y.o., female Today's Date: 09/09/2022  END OF SESSION:  PT End of Session - 09/09/22 0747     Visit Number 7    Number of Visits 25    Date for PT Re-Evaluation 11/12/22    PT Start Time 0747    PT Stop Time 0844    PT Time Calculation (min) 57 min    Activity Tolerance Patient limited by pain    Behavior During Therapy [facility] for tasks assessed/performed            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HCC)    Arthritis    Branch retinal artery occlusion, left eye    Chronic kidney disease    moderate   Fatigue    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 11/25/2017   Hyperkalemia    Hyperlipidemia    Hypertension    Pneumonia    at least 10 years ago   Pre-diabetes    Patient Active Problem List   Diagnosis Date Noted   Osteoarthritis of right knee 08/16/2022   Osteoarthritis of knee 08/13/2020   OA (osteoarthritis) of knee 08/11/2020   Primary osteoarthritis of left knee 08/11/2020   SOB (shortness of breath) 10/10/2017   Coronary artery calcification 09/19/2016   Chest pain 02/03/2016   Elevated troponin 02/03/2016   Anxiety 02/03/2016   Essential hypertension 02/03/2016   Mixed hyperlipidemia 02/03/2016   Chest tightness 01/01/2016    PCP: Lauro Regulus, MD  REFERRING PROVIDER: Lauro Regulus, MD  REFERRING DIAG: s/p R TKA  RATIONALE FOR EVALUATION AND TREATMENT: Rehabilitation  THERAPY DIAG: Total knee replacement status, right  Muscle weakness (generalized)  Stiffness of right knee, not elsewhere classified  Gait difficulty  ONSET DATE: 08/16/2022  FOLLOW-UP APPT SCHEDULED WITH REFERRING PROVIDER: Yes    SUBJECTIVE:                                                                                                                                                                                          SUBJECTIVE STATEMENT:  s/p R TKR  PERTINENT HISTORY:  [place] M Netzley, 77 y.o. female has a history of pain and functional disability in the right knee due to arthritis and has failed non-surgical conservative treatments for greater than 12 weeks to include corticosteriod injections, viscosupplementation injections, and activity modification. She reports to physical therapy today s/p right TKA on 08/16/2022. "Since the surgery, I've felt well except for yesterday. I woke up with significant pain in my right leg (  8/10) and it kept me from walking or performing any exercise." Pt also complains of severe swelling in RLE. Pt reports slight pain in hip right hip flexor with exercises prescribed from hospital.   PAIN:   Pain Intensity: Present: 0/10, Best: 0/10, Worst: 8/10 Pain location: Right Knee Pain quality: intermittent, incisional   Radiating pain: No  Swelling: Yes  Popping, catching, locking: No  Numbness/Tingling: No Focal weakness or buckling: No Aggravating factors: Post-Op Exercises, Straight Leg Raise  Relieving factors: Resting, Ice  24-hour pain behavior: AM: Sharp Pain PM: Movement Reduces the pain throughout the day History of prior back, hip, or knee injury, pain, surgery, or therapy: Yes Left TKA Dominant hand: right Imaging: Yes  Prior level of function: Independent with household mobility with device Two-Wheeled Walker  Occupational demands: Retired  Presenter, broadcasting: Social research officer, government, Walking on [employer] flags: Negative for personal history of cancer, chills/fever, and denies night sweats, nausea, vomiting, unexplained weight gain/loss, unrelenting pain  PRECAUTIONS: Knee  WEIGHT BEARING RESTRICTIONS: No  FALLS: Has patient fallen in last 6 months? No  Living Environment Lives with: lives alone Lives in: House/apartment Stairs: 13 Steps Left Rail going up    Patient Goals: "I would like to get stronger, build endurance and return to  gardening."    OBJECTIVE:   Patient Surveys  FOTO 60, predicted to 19  Cognition Patient is oriented to person, place, and time.  Recent memory is intact.  Remote memory is intact.  Attention span and concentration are intact.  Expressive speech is intact.  Patient's fund of knowledge is within normal limits for educational level.    Gross Musculoskeletal Assessment Tremor: None Tone: Normal Severe swelling noted in R thigh and calf compared to LLE. PT measured lower extremity swelling 10cm inferior/superior to patella: R Inf: 43cm, L Inf: 37.5, R Sup: 59cm,  L Sup: 53cm.   GAIT: Distance walked: 56m  Assistive device utilized: Environmental consultant - 2 wheeled Level of assistance: Modified independence Comments: Patient presents with antalgic step through gait pattern. Decreased knee flexion during swing.  Posture:  AROM AROM (Normal range in degrees) AROM  Hip Right Left  Flexion (125) WNL WNL  Extension (15)    Abduction (40)    Adduction     Internal Rotation (45)    External Rotation (45)        Knee    Flexion (135) 96* 121  Extension (0) -14* (lacking) 8      Ankle    Dorsiflexion (20) WNL WNL  Plantarflexion (50) WNL WNL  Inversion (35) WNL WNL  Eversion (15 WNL   (* = pain; Blank rows = not tested)  LE MMT: MMT (out of 5) Right Left  Hip flexion    Hip extension    Hip abduction    Hip adduction    Hip internal rotation    Hip external rotation    Knee flexion    Knee extension    Ankle dorsiflexion 5 5  Ankle plantarflexion 5 5  Ankle inversion    Ankle eversion    (* = pain; Blank rows = not tested)  Sensation Grossly intact to light touch bilateral LEs as determined by testing dermatomes L2-S2. Proprioception, and hot/cold testing deferred on this date.  Reflexes Deferred  Muscle Length Deferred  Palpation Location LEFT  RIGHT           Quadriceps 0 0  Medial Hamstrings 0 2  Lateral Hamstrings 0 1  Lateral Hamstring tendon  1  Medial  Hamstring tendon  1  Quadriceps tendon    Patella    Patellar Tendon    Tibial Tuberosity    Medial joint line    Lateral joint line    MCL    LCL    Adductor Tubercle    Pes Anserine tendon    Infrapatellar fat pad    Fibular head    Popliteal fossa 0 1  (Blank rows = not tested) Graded on 0-4 scale (0 = no pain, 1 = pain, 2 = pain with wincing/grimacing/flinching, 3 = pain with withdrawal, 4 = unwilling to allow palpation), (Blank rows = not tested)  Passive Accessory Motion Tibiofemoral: Deferred  Fibula on Femur: Deferred  Patellofemoral: Deferred  VASCULAR Dorsalis pedis and posterior tibial pulses are palpable  WELL Criteria for DVT:  4 Points - High Risk Factor for "Likely" DVT  Calf Swelling Greater than 3 cm compared to contralateral limb Localized Tenderness along the the deep venous system Entire Leg Swollen  Major Swelling within 12 weeks  8/16: Supine R knee AROM (-12 to 74 deg.) Supine R knee PROM (-8 to 88 deg.).     TODAY'S TREATMENT: 09/09/2022  Subjective:  Pt. Reports no R knee pain prior to tx. Session.  Pt. Took Tramadol prior to tx. Session.  Pt. Entered PT with use of SPC and no issues.  Pt. Took 1 Tramadol this morning prior to tx. Session and was able to step out of passenger side of SUV.      Objective:  Therex:  Nustep seat 8-5 L4 for 10 min. (Focus on R knee in midline to increase flexion)- B LE only with average of 50+ SPM.    Supine R quad sets/ SLR/ heel slides: 10x with verbal/tactile cues for correct form.  Static holds as tolerated.     5xSTS: 13.6 sec./ 12.82 sec.   Seated LAQ on R: 2x15 - verbal/tactile cues for proper form.  Pt. Remain tender/ sensitive with palpation.  Pt. Instructed to start scar massage on proximal 1/2 of incision.    Ascending/ descending stairs with recip. Gait pattern and light UE assist.  Good eccentric quad control.    Walking outside on sidewalks/ grassy terrain with [facility] working on consistent  step pattern/ BOS.     Reviewed HEP  Manual:  Supine R hip/knee AA/PROM 6x with static holds (as tolerated)- 104 deg. Flexion after stretches (pain limited).    Seated MET to increase R knee flexion 5x 5 sec. Holds with OP.  Pain tolerable.    Supine R hamstring/ gastroc gentle stretches 3x each with holds.   Supine Grade II-III R patellar mobilizations: superior/inferior + medial/lateral - x5 minutes (increase patellar mobility today due to less swelling in joint).    Pt. Will ice at home.     PATIENT EDUCATION:  Education details: Plan of Care, HEP, Post-op Care  Person educated: Patient Education method: Explanation Education comprehension: verbalized understanding   HOME EXERCISE PROGRAM:  Ankle Pumps - 7x a week - 1 Daily - 1-2 sets - 10 reps Straight Leg Raise - 7x a week - 1 Daily - 1-2 sets - 10 reps  Access Code: [email or web address] URL: [email or web address] Date: 08/27/2022 Prepared by: Dorene Grebe  Exercises - Active Straight Leg Raise with Quad Set  - 2 x daily - 7 x weekly - 2 sets - 10 reps - Supine Quad Set  - 2 x daily - 7 x weekly - 2 sets - 10 reps - Supine  Heel Slide with Strap  - 2 x daily - 7 x weekly - 2 sets - 10 reps - Seated Long Arc Quad  - 2 x daily - 7 x weekly - 2 sets - 10 reps - Seated Knee Flexion Extension AAROM with Overpressure  - 2 x daily - 7 x weekly - 1 sets - 10 reps   ASSESSMENT:  CLINICAL IMPRESSION: Pt. progressing well with R knee/hip AROM but remains pain limited with knee flexion >100 deg.    Good incision healing on proximal 1/2 of incision and pt. Able to start scar massage.  Pt. Has light scabbing on distal aspect of incision.  Pt. Has progressed to ambulating with no assistive device on level surfaces safely and may benefit from use of SPC on outside/grassy terrain. Pt. Progressing well with eccentric quad control/ recip. Gait pattern on stairs.  Pt will benefit from continued PT services upon discharge to safely  address deficits listed in patient problem list for decreased caregiver assistance and eventual return to PLOF.   OBJECTIVE IMPAIRMENTS: Abnormal gait, decreased balance, decreased endurance, difficulty walking, decreased ROM, decreased strength, and pain.   ACTIVITY LIMITATIONS: bending, sitting, standing, squatting, and transfers  PARTICIPATION LIMITATIONS: community activity and yard work  PERSONAL FACTORS: Age, Past/current experiences, and 3+ comorbidities: CKD, Hyperkalemia, HLD, HTN , PAN  are also affecting patient's functional outcome.   REHAB POTENTIAL: Good  CLINICAL DECISION MAKING: Stable/uncomplicated  EVALUATION COMPLEXITY: Moderate   GOALS: Goals reviewed with patient? Yes  SHORT TERM GOALS: Target date: 10/15/2022  Pt will be independent with HEP to improve strength and decrease knee pain to improve pain-free function at home and work. Baseline:  Goal status: Goal met   LONG TERM GOALS: Target date: 11/12/2022  Pt will increase FOTO to at least 66 to demonstrate significant improvement in function at home and work related to knee pain  Baseline: 08/20/22: 53 Goal status: INITIAL  2.  Pt will decrease worst knee pain by at least 3 points on the NPRS in order to demonstrate clinically significant reduction in knee pain. Baseline: 08/09: 8/10 Goal status: INITIAL  3. Pt will be able to perform deep squat without pain in order to demonstrate improvement in function as she returns to gardening. Baseline:  Goal status: INITIAL  4. Pt will be able to achieve less than 5 degrees of knee extension and at least 115 degrees of knee flexion in order to normalize gait pattern and improve functional mobility.  Baseline: 08/09: -14 to 96 Goal status: INITIAL  PLAN: PT FREQUENCY: 1-2x/week  PT DURATION: 12 weeks  PLANNED INTERVENTIONS: Therapeutic exercises, Therapeutic activity, Neuromuscular re-education, Balance training, Gait training, Patient/Family education, Self  Care, Joint mobilization, Joint manipulation, Vestibular training, Canalith repositioning, Orthotic/Fit training, DME instructions, Dry Needling, Electrical stimulation, Spinal manipulation, Spinal mobilization, Cryotherapy, Moist heat, Taping, Traction, Ultrasound, Ionotophoresis 4mg /ml Dexamethasone, Manual therapy, and Re-evaluation.  PLAN FOR NEXT SESSION: Progress R knee PROM/AROM exercises as tolerated, progress R LE strengthening as tolerated.  Check knee AROM.    Cammie Mcgee, PT, DPT # [phone] Physical Therapist - [facility]  09/09/22, 8:59 AM

## 2022-09-14 ENCOUNTER — Encounter: Payer: Medicare Other | Admitting: Physical Therapy

## 2022-09-15 ENCOUNTER — Ambulatory Visit: Payer: Medicare Other | Attending: Internal Medicine | Admitting: Physical Therapy

## 2022-09-15 DIAGNOSIS — Z96651 Presence of right artificial knee joint: Secondary | ICD-10-CM | POA: Diagnosis present

## 2022-09-15 DIAGNOSIS — M25661 Stiffness of right knee, not elsewhere classified: Secondary | ICD-10-CM | POA: Diagnosis present

## 2022-09-15 DIAGNOSIS — R269 Unspecified abnormalities of gait and mobility: Secondary | ICD-10-CM | POA: Insufficient documentation

## 2022-09-15 DIAGNOSIS — M6281 Muscle weakness (generalized): Secondary | ICD-10-CM | POA: Diagnosis present

## 2022-09-15 DIAGNOSIS — M25662 Stiffness of left knee, not elsewhere classified: Secondary | ICD-10-CM | POA: Insufficient documentation

## 2022-09-16 ENCOUNTER — Encounter: Payer: Medicare Other | Admitting: Physical Therapy

## 2022-09-16 ENCOUNTER — Encounter: Payer: Self-pay | Admitting: Physical Therapy

## 2022-09-16 NOTE — Therapy (Signed)
OUTPATIENT PHYSICAL THERAPY KNEE TREATMENT  Patient Name: Joanne Lewis MRN: [id number] DOB:May 30, 1945, 77 y.o., female Today's Date: 09/16/2022  END OF SESSION:  PT End of Session - 09/16/22 0752     Visit Number 8    Number of Visits 25    Date for PT Re-Evaluation 11/12/22    PT Start Time 1019    PT Stop Time 1113    PT Time Calculation (min) 54 min    Activity Tolerance Patient limited by pain    Behavior During Therapy [facility] for tasks assessed/performed            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HCC)    Arthritis    Branch retinal artery occlusion, left eye    Chronic kidney disease    moderate   Fatigue    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 11/25/2017   Hyperkalemia    Hyperlipidemia    Hypertension    Pneumonia    at least 10 years ago   Pre-diabetes    Patient Active Problem List   Diagnosis Date Noted   Osteoarthritis of right knee 08/16/2022   Osteoarthritis of knee 08/13/2020   OA (osteoarthritis) of knee 08/11/2020   Primary osteoarthritis of left knee 08/11/2020   SOB (shortness of breath) 10/10/2017   Coronary artery calcification 09/19/2016   Chest pain 02/03/2016   Elevated troponin 02/03/2016   Anxiety 02/03/2016   Essential hypertension 02/03/2016   Mixed hyperlipidemia 02/03/2016   Chest tightness 01/01/2016    PCP: Lauro Regulus, MD  REFERRING PROVIDER: Lauro Regulus, MD  REFERRING DIAG: s/p R TKA  RATIONALE FOR EVALUATION AND TREATMENT: Rehabilitation  THERAPY DIAG: Total knee replacement status, right  Muscle weakness (generalized)  Stiffness of right knee, not elsewhere classified  Gait difficulty  ONSET DATE: 08/16/2022  FOLLOW-UP APPT SCHEDULED WITH REFERRING PROVIDER: Yes    SUBJECTIVE:                                                                                                                                                                                          SUBJECTIVE STATEMENT:  s/p R TKR  PERTINENT HISTORY:  Joanne Lewis, 77 y.o. female has a history of pain and functional disability in the right knee due to arthritis and has failed non-surgical conservative treatments for greater than 12 weeks to include corticosteriod injections, viscosupplementation injections, and activity modification. She reports to physical therapy today s/p right TKA on 08/16/2022. "Since the surgery, I've felt well except for yesterday. I woke up with significant pain in my right leg (  8/10) and it kept me from walking or performing any exercise." Pt also complains of severe swelling in RLE. Pt reports slight pain in hip right hip flexor with exercises prescribed from hospital.   PAIN:   Pain Intensity: Present: 0/10, Best: 0/10, Worst: 8/10 Pain location: Right Knee Pain quality: intermittent, incisional   Radiating pain: No  Swelling: Yes  Popping, catching, locking: No  Numbness/Tingling: No Focal weakness or buckling: No Aggravating factors: Post-Op Exercises, Straight Leg Raise  Relieving factors: Resting, Ice  24-hour pain behavior: AM: Sharp Pain PM: Movement Reduces the pain throughout the day History of prior back, hip, or knee injury, pain, surgery, or therapy: Yes Left TKA Dominant hand: right Imaging: Yes  Prior level of function: Independent with household mobility with device Two-Wheeled Walker  Occupational demands: Retired  Presenter, broadcasting: Social research officer, government, Walking on [employer] flags: Negative for personal history of cancer, chills/fever, and denies night sweats, nausea, vomiting, unexplained weight gain/loss, unrelenting pain  PRECAUTIONS: Knee  WEIGHT BEARING RESTRICTIONS: No  FALLS: Has patient fallen in last 6 months? No  Living Environment Lives with: lives alone Lives in: House/apartment Stairs: 13 Steps Left Rail going up    Patient Goals: "I would like to get stronger, build endurance and return to  gardening."    OBJECTIVE:   Patient Surveys  FOTO 27, predicted to 97  Cognition Patient is oriented to person, place, and time.  Recent memory is intact.  Remote memory is intact.  Attention span and concentration are intact.  Expressive speech is intact.  Patient's fund of knowledge is within normal limits for educational level.    Gross Musculoskeletal Assessment Tremor: None Tone: Normal Severe swelling noted in R thigh and calf compared to LLE. PT measured lower extremity swelling 10cm inferior/superior to patella: R Inf: 43cm, L Inf: 37.5, R Sup: 59cm,  L Sup: 53cm.   GAIT: Distance walked: 88m  Assistive device utilized: Environmental consultant - 2 wheeled Level of assistance: Modified independence Comments: Patient presents with antalgic step through gait pattern. Decreased knee flexion during swing.  Posture:  AROM AROM (Normal range in degrees) AROM  Hip Right Left  Flexion (125) WNL WNL  Extension (15)    Abduction (40)    Adduction     Internal Rotation (45)    External Rotation (45)        Knee    Flexion (135) 96* 121  Extension (0) -14* (lacking) 8      Ankle    Dorsiflexion (20) WNL WNL  Plantarflexion (50) WNL WNL  Inversion (35) WNL WNL  Eversion (15 WNL   (* = pain; Blank rows = not tested)  LE MMT: MMT (out of 5) Right Left  Hip flexion    Hip extension    Hip abduction    Hip adduction    Hip internal rotation    Hip external rotation    Knee flexion    Knee extension    Ankle dorsiflexion 5 5  Ankle plantarflexion 5 5  Ankle inversion    Ankle eversion    (* = pain; Blank rows = not tested)  Sensation Grossly intact to light touch bilateral LEs as determined by testing dermatomes L2-S2. Proprioception, and hot/cold testing deferred on this date.  Reflexes Deferred  Muscle Length Deferred  Palpation Location LEFT  RIGHT           Quadriceps 0 0  Medial Hamstrings 0 2  Lateral Hamstrings 0 1  Lateral Hamstring tendon  1  Medial  Hamstring tendon  1  Quadriceps tendon    Patella    Patellar Tendon    Tibial Tuberosity    Medial joint line    Lateral joint line    MCL    LCL    Adductor Tubercle    Pes Anserine tendon    Infrapatellar fat pad    Fibular head    Popliteal fossa 0 1  (Blank rows = not tested) Graded on 0-4 scale (0 = no pain, 1 = pain, 2 = pain with wincing/grimacing/flinching, 3 = pain with withdrawal, 4 = unwilling to allow palpation), (Blank rows = not tested)  Passive Accessory Motion Tibiofemoral: Deferred  Fibula on Femur: Deferred  Patellofemoral: Deferred  VASCULAR Dorsalis pedis and posterior tibial pulses are palpable  WELL Criteria for DVT:  4 Points - High Risk Factor for "Likely" DVT  Calf Swelling Greater than 3 cm compared to contralateral limb Localized Tenderness along the the deep venous system Entire Leg Swollen  Major Swelling within 12 weeks  8/16: Supine R knee AROM (-12 to 74 deg.) Supine R knee PROM (-8 to 88 deg.).   8/29:  5xSTS: 13.6 sec./ 12.82 sec.    TODAY'S TREATMENT: 09/16/2022  Subjective:  Pt. Reports is has been able to ascend/ descend basement stairs to do laundry.  Pt. Sleeping better at night and walking around without use of SPC.    Objective:  Therex:  Nustep seat 7-5 L4 for 10 min. (Focus on R knee in midline to increase flexion)- B LE only with average of 50+ SPM.    Supine R quad sets/ SLR/ heel slides: 10x with verbal/tactile cues for correct form.  Static holds as tolerated.     Ascending/ descending stairs with recip. Gait pattern and light UE assist.  Good eccentric quad control.    Walking outside on sidewalks/ grassy terrain without SPC working on consistent step pattern/ BOS.     Reviewed HEP  Manual:  Supine R hip/knee AA/PROM 5x with static holds (as tolerated)- 108 deg. Flexion after stretches (pain limited).    Seated MET to increase R knee flexion 5x 5 sec. Holds with OP.  Pain tolerable.  Tender with light  palpation over incision/ medial and lateral aspects of knee.    Supine R hamstring/ gastroc gentle stretches 3x each with holds.   Supine Grade II-III R patellar mobilizations: superior/inferior + medial/lateral - x5 minutes (increase patellar mobility today due to less swelling in joint).  Instructed in scar massage with lotion to proximal aspects of healed incision.    Pt. Will ice at home.     PATIENT EDUCATION:  Education details: Plan of Care, HEP, Post-op Care  Person educated: Patient Education method: Explanation Education comprehension: verbalized understanding   HOME EXERCISE PROGRAM:  Ankle Pumps - 7x a week - 1 Daily - 1-2 sets - 10 reps Straight Leg Raise - 7x a week - 1 Daily - 1-2 sets - 10 reps  Access Code: [email or web address] URL: [email or web address] Date: 08/27/2022 Prepared by: Dorene Grebe  Exercises - Active Straight Leg Raise with Quad Set  - 2 x daily - 7 x weekly - 2 sets - 10 reps - Supine Quad Set  - 2 x daily - 7 x weekly - 2 sets - 10 reps - Supine Heel Slide with Strap  - 2 x daily - 7 x weekly - 2 sets - 10 reps - Seated Long Arc Quad  - 2 x daily - 7 x  weekly - 2 sets - 10 reps - Seated Knee Flexion Extension AAROM with Overpressure  - 2 x daily - 7 x weekly - 1 sets - 10 reps   ASSESSMENT:  CLINICAL IMPRESSION: Pt. progressing well with R knee/hip AROM but remains pain limited with knee flexion >100 deg.    Pt. Has light scabbing on distal aspect of incision.  Pt. Has progressed to ambulating with no assistive device on level surfaces safely and may benefit from use of SPC on outside/grassy terrain. Pt. Progressing well with eccentric quad control/ recip. Gait pattern on stairs.  Pt will benefit from continued PT services upon discharge to safely address deficits listed in patient problem list for decreased caregiver assistance and eventual return to PLOF.   OBJECTIVE IMPAIRMENTS: Abnormal gait, decreased balance, decreased endurance,  difficulty walking, decreased ROM, decreased strength, and pain.   ACTIVITY LIMITATIONS: bending, sitting, standing, squatting, and transfers  PARTICIPATION LIMITATIONS: community activity and yard work  PERSONAL FACTORS: Age, Past/current experiences, and 3+ comorbidities: CKD, Hyperkalemia, HLD, HTN , PAN  are also affecting patient's functional outcome.   REHAB POTENTIAL: Good  CLINICAL DECISION MAKING: Stable/uncomplicated  EVALUATION COMPLEXITY: Moderate   GOALS: Goals reviewed with patient? Yes  SHORT TERM GOALS: Target date: 10/15/2022  Pt will be independent with HEP to improve strength and decrease knee pain to improve pain-free function at home and work. Baseline:  Goal status: Goal met   LONG TERM GOALS: Target date: 11/12/2022  Pt will increase FOTO to at least 66 to demonstrate significant improvement in function at home and work related to knee pain  Baseline: 08/20/22: 53 Goal status: INITIAL  2.  Pt will decrease worst knee pain by at least 3 points on the NPRS in order to demonstrate clinically significant reduction in knee pain. Baseline: 08/09: 8/10 Goal status: INITIAL  3. Pt will be able to perform deep squat without pain in order to demonstrate improvement in function as she returns to gardening. Baseline:  Goal status: INITIAL  4. Pt will be able to achieve less than 5 degrees of knee extension and at least 115 degrees of knee flexion in order to normalize gait pattern and improve functional mobility.  Baseline: 08/09: -14 to 96 Goal status: INITIAL  PLAN: PT FREQUENCY: 1-2x/week  PT DURATION: 12 weeks  PLANNED INTERVENTIONS: Therapeutic exercises, Therapeutic activity, Neuromuscular re-education, Balance training, Gait training, Patient/Family education, Self Care, Joint mobilization, Joint manipulation, Vestibular training, Canalith repositioning, Orthotic/Fit training, DME instructions, Dry Needling, Electrical stimulation, Spinal manipulation,  Spinal mobilization, Cryotherapy, Moist heat, Taping, Traction, Ultrasound, Ionotophoresis 4mg /ml Dexamethasone, Manual therapy, and Re-evaluation.  PLAN FOR NEXT SESSION: Progress R knee PROM/AROM exercises as tolerated, progress R LE strengthening as tolerated.  Check knee AROM.    Cammie Mcgee, PT, DPT # [phone] Physical Therapist - [facility]  09/16/22, 7:54 AM

## 2022-09-17 ENCOUNTER — Encounter: Payer: Self-pay | Admitting: Physical Therapy

## 2022-09-17 ENCOUNTER — Ambulatory Visit: Payer: Medicare Other | Admitting: Physical Therapy

## 2022-09-17 DIAGNOSIS — M6281 Muscle weakness (generalized): Secondary | ICD-10-CM

## 2022-09-17 DIAGNOSIS — Z96651 Presence of right artificial knee joint: Secondary | ICD-10-CM

## 2022-09-17 DIAGNOSIS — R269 Unspecified abnormalities of gait and mobility: Secondary | ICD-10-CM

## 2022-09-17 DIAGNOSIS — M25661 Stiffness of right knee, not elsewhere classified: Secondary | ICD-10-CM

## 2022-09-17 NOTE — Therapy (Signed)
OUTPATIENT PHYSICAL THERAPY KNEE TREATMENT  Patient Name: Joanne Lewis MRN: [id number] DOB:05-19-1945, 77 y.o., female Today's Date: 09/17/2022  END OF SESSION:  PT End of Session - 09/17/22 0928     Visit Number 9    Number of Visits 25    Date for PT Re-Evaluation 11/12/22    PT Start Time 0840    PT Stop Time 0932    PT Time Calculation (min) 52 min    Activity Tolerance Patient limited by pain    Behavior During Therapy [facility] for tasks assessed/performed            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HCC)    Arthritis    Branch retinal artery occlusion, left eye    Chronic kidney disease    moderate   Fatigue    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 11/25/2017   Hyperkalemia    Hyperlipidemia    Hypertension    Pneumonia    at least 10 years ago   Pre-diabetes    Patient Active Problem List   Diagnosis Date Noted   Osteoarthritis of right knee 08/16/2022   Osteoarthritis of knee 08/13/2020   OA (osteoarthritis) of knee 08/11/2020   Primary osteoarthritis of left knee 08/11/2020   SOB (shortness of breath) 10/10/2017   Coronary artery calcification 09/19/2016   Chest pain 02/03/2016   Elevated troponin 02/03/2016   Anxiety 02/03/2016   Essential hypertension 02/03/2016   Mixed hyperlipidemia 02/03/2016   Chest tightness 01/01/2016    PCP: Lauro Regulus, MD  REFERRING PROVIDER: Lauro Regulus, MD  REFERRING DIAG: s/p R TKA  RATIONALE FOR EVALUATION AND TREATMENT: Rehabilitation  THERAPY DIAG: Total knee replacement status, right  Muscle weakness (generalized)  Stiffness of right knee, not elsewhere classified  Gait difficulty  ONSET DATE: 08/16/2022  FOLLOW-UP APPT SCHEDULED WITH REFERRING PROVIDER: Yes    SUBJECTIVE:                                                                                                                                                                                          SUBJECTIVE STATEMENT:  s/p R TKR  PERTINENT HISTORY:  [name], 77 y.o. female has a history of pain and functional disability in the right knee due to arthritis and has failed non-surgical conservative treatments for greater than 12 weeks to include corticosteriod injections, viscosupplementation injections, and activity modification. She reports to physical therapy today s/p right TKA on 08/16/2022. "Since the surgery, I've felt well except for yesterday. I woke up with significant pain in my right leg (  8/10) and it kept me from walking or performing any exercise." Pt also complains of severe swelling in RLE. Pt reports slight pain in hip right hip flexor with exercises prescribed from hospital.   PAIN:   Pain Intensity: Present: 0/10, Best: 0/10, Worst: 8/10 Pain location: Right Knee Pain quality: intermittent, incisional   Radiating pain: No  Swelling: Yes  Popping, catching, locking: No  Numbness/Tingling: No Focal weakness or buckling: No Aggravating factors: Post-Op Exercises, Straight Leg Raise  Relieving factors: Resting, Ice  24-hour pain behavior: AM: Sharp Pain PM: Movement Reduces the pain throughout the day History of prior back, hip, or knee injury, pain, surgery, or therapy: Yes Left TKA Dominant hand: right Imaging: Yes  Prior level of function: Independent with household mobility with device Two-Wheeled Walker  Occupational demands: Retired  Presenter, broadcasting: Social research officer, government, Walking on [employer] flags: Negative for personal history of cancer, chills/fever, and denies night sweats, nausea, vomiting, unexplained weight gain/loss, unrelenting pain  PRECAUTIONS: Knee  WEIGHT BEARING RESTRICTIONS: No  FALLS: Has patient fallen in last 6 months? No  Living Environment Lives with: lives alone Lives in: House/apartment Stairs: 13 Steps Left Rail going up    Patient Goals: "I would like to get stronger, build endurance and return to  gardening."    OBJECTIVE:   Patient Surveys  FOTO 62, predicted to 81  Cognition Patient is oriented to person, place, and time.  Recent memory is intact.  Remote memory is intact.  Attention span and concentration are intact.  Expressive speech is intact.  Patient's fund of knowledge is within normal limits for educational level.    Gross Musculoskeletal Assessment Tremor: None Tone: Normal Severe swelling noted in R thigh and calf compared to LLE. PT measured lower extremity swelling 10cm inferior/superior to patella: R Inf: 43cm, L Inf: 37.5, R Sup: 59cm,  L Sup: 53cm.   GAIT: Distance walked: 64m  Assistive device utilized: Environmental consultant - 2 wheeled Level of assistance: Modified independence Comments: Patient presents with antalgic step through gait pattern. Decreased knee flexion during swing.  Posture:  AROM AROM (Normal range in degrees) AROM  Hip Right Left  Flexion (125) WNL WNL  Extension (15)    Abduction (40)    Adduction     Internal Rotation (45)    External Rotation (45)        Knee    Flexion (135) 96* 121  Extension (0) -14* (lacking) 8      Ankle    Dorsiflexion (20) WNL WNL  Plantarflexion (50) WNL WNL  Inversion (35) WNL WNL  Eversion (15 WNL   (* = pain; Blank rows = not tested)  LE MMT: MMT (out of 5) Right Left  Hip flexion    Hip extension    Hip abduction    Hip adduction    Hip internal rotation    Hip external rotation    Knee flexion    Knee extension    Ankle dorsiflexion 5 5  Ankle plantarflexion 5 5  Ankle inversion    Ankle eversion    (* = pain; Blank rows = not tested)  Sensation Grossly intact to light touch bilateral LEs as determined by testing dermatomes L2-S2. Proprioception, and hot/cold testing deferred on this date.  Reflexes Deferred  Muscle Length Deferred  Palpation Location LEFT  RIGHT           Quadriceps 0 0  Medial Hamstrings 0 2  Lateral Hamstrings 0 1  Lateral Hamstring tendon  1  Medial  Hamstring tendon  1  Quadriceps tendon    Patella    Patellar Tendon    Tibial Tuberosity    Medial joint line    Lateral joint line    MCL    LCL    Adductor Tubercle    Pes Anserine tendon    Infrapatellar fat pad    Fibular head    Popliteal fossa 0 1  (Blank rows = not tested) Graded on 0-4 scale (0 = no pain, 1 = pain, 2 = pain with wincing/grimacing/flinching, 3 = pain with withdrawal, 4 = unwilling to allow palpation), (Blank rows = not tested)  Passive Accessory Motion Tibiofemoral: Deferred  Fibula on Femur: Deferred  Patellofemoral: Deferred  VASCULAR Dorsalis pedis and posterior tibial pulses are palpable  WELL Criteria for DVT:  4 Points - High Risk Factor for "Likely" DVT  Calf Swelling Greater than 3 cm compared to contralateral limb Localized Tenderness along the the deep venous system Entire Leg Swollen  Major Swelling within 12 weeks  8/16: Supine R knee AROM (-12 to 74 deg.) Supine R knee PROM (-8 to 88 deg.).   8/29:  5xSTS: 13.6 sec./ 12.82 sec.    TODAY'S TREATMENT: 09/17/2022  Subjective:  Pt. Woke up early this morning due to knee discomfort/ sensitivity.  Pt. Carrying SPC into PT clinic and states she is not using at home.  Pt. Has not returned to driving at this time.     Objective:  Therex:  Nustep seat 7-5 L4 for 10 min. (Focus on R knee in midline to increase flexion)- B LE only with average of 50+ SPM.      Manual:  Supine R hip/knee AA/PROM 5x with static holds (as tolerated)- 108 deg. Flexion after stretches (pain limited).    Seated MET to increase R knee flexion 5x 5 sec. Holds with OP.  Pain tolerable.  Tender with light palpation over incision/ medial and lateral aspects of knee.    Supine R hamstring/ gastroc gentle stretches 3x each with holds.   Supine Grade II-III R patellar mobilizations: superior/inferior + medial/lateral - x5 minutes (increase patellar mobility today due to less swelling in joint).  Instructed in  scar massage with lotion to proximal aspects of healed incision.    Pt. Will ice at home.     PATIENT EDUCATION:  Education details: Plan of Care, HEP, Post-op Care  Person educated: Patient Education method: Explanation Education comprehension: verbalized understanding   HOME EXERCISE PROGRAM:  Ankle Pumps - 7x a week - 1 Daily - 1-2 sets - 10 reps Straight Leg Raise - 7x a week - 1 Daily - 1-2 sets - 10 reps  Access Code: [email or web address] URL: [email or web address] Date: 08/27/2022 Prepared by: Dorene Grebe  Exercises - Active Straight Leg Raise with Quad Set  - 2 x daily - 7 x weekly - 2 sets - 10 reps - Supine Quad Set  - 2 x daily - 7 x weekly - 2 sets - 10 reps - Supine Heel Slide with Strap  - 2 x daily - 7 x weekly - 2 sets - 10 reps - Seated Long Arc Quad  - 2 x daily - 7 x weekly - 2 sets - 10 reps - Seated Knee Flexion Extension AAROM with Overpressure  - 2 x daily - 7 x weekly - 1 sets - 10 reps   ASSESSMENT:  CLINICAL IMPRESSION: Pt. progressing well with R knee/hip AROM but remains pain limited with knee flexion >100  deg.    Pt. Has light scabbing on distal aspect of incision.  Pt. Has progressed to ambulating with no assistive device on level surfaces safely and may benefit from use of SPC on outside/grassy terrain. Pt. Progressing well with eccentric quad control/ recip. Gait pattern on stairs.  Pt will benefit from continued PT services upon discharge to safely address deficits listed in patient problem list for decreased caregiver assistance and eventual return to PLOF.   OBJECTIVE IMPAIRMENTS: Abnormal gait, decreased balance, decreased endurance, difficulty walking, decreased ROM, decreased strength, and pain.   ACTIVITY LIMITATIONS: bending, sitting, standing, squatting, and transfers  PARTICIPATION LIMITATIONS: community activity and yard work  PERSONAL FACTORS: Age, Past/current experiences, and 3+ comorbidities: CKD, Hyperkalemia, HLD, HTN , PAN   are also affecting patient's functional outcome.   REHAB POTENTIAL: Good  CLINICAL DECISION MAKING: Stable/uncomplicated  EVALUATION COMPLEXITY: Moderate   GOALS: Goals reviewed with patient? Yes  SHORT TERM GOALS: Target date: 10/15/2022  Pt will be independent with HEP to improve strength and decrease knee pain to improve pain-free function at home and work. Baseline:  Goal status: Goal met   LONG TERM GOALS: Target date: 11/12/2022  Pt will increase FOTO to at least 66 to demonstrate significant improvement in function at home and work related to knee pain  Baseline: 08/20/22: 53 Goal status: INITIAL  2.  Pt will decrease worst knee pain by at least 3 points on the NPRS in order to demonstrate clinically significant reduction in knee pain. Baseline: 08/09: 8/10 Goal status: INITIAL  3. Pt will be able to perform deep squat without pain in order to demonstrate improvement in function as she returns to gardening. Baseline:  Goal status: INITIAL  4. Pt will be able to achieve less than 5 degrees of knee extension and at least 115 degrees of knee flexion in order to normalize gait pattern and improve functional mobility.  Baseline: 08/09: -14 to 96 Goal status: INITIAL  PLAN: PT FREQUENCY: 1-2x/week  PT DURATION: 12 weeks  PLANNED INTERVENTIONS: Therapeutic exercises, Therapeutic activity, Neuromuscular re-education, Balance training, Gait training, Patient/Family education, Self Care, Joint mobilization, Joint manipulation, Vestibular training, Canalith repositioning, Orthotic/Fit training, DME instructions, Dry Needling, Electrical stimulation, Spinal manipulation, Spinal mobilization, Cryotherapy, Moist heat, Taping, Traction, Ultrasound, Ionotophoresis 4mg /ml Dexamethasone, Manual therapy, and Re-evaluation.  PLAN FOR NEXT SESSION: Progress R knee PROM/AROM exercises as tolerated, progress R LE strengthening as tolerated.  Check knee AROM.    Cammie Mcgee, PT, DPT #  [phone] Physical Therapist - [facility]  09/17/22, 9:33 AM

## 2022-09-20 ENCOUNTER — Ambulatory Visit: Payer: Medicare Other | Admitting: Physical Therapy

## 2022-09-20 ENCOUNTER — Encounter: Payer: Self-pay | Admitting: Physical Therapy

## 2022-09-20 DIAGNOSIS — Z96651 Presence of right artificial knee joint: Secondary | ICD-10-CM

## 2022-09-20 DIAGNOSIS — R269 Unspecified abnormalities of gait and mobility: Secondary | ICD-10-CM

## 2022-09-20 DIAGNOSIS — M6281 Muscle weakness (generalized): Secondary | ICD-10-CM

## 2022-09-20 DIAGNOSIS — M25661 Stiffness of right knee, not elsewhere classified: Secondary | ICD-10-CM

## 2022-09-20 NOTE — Therapy (Signed)
OUTPATIENT PHYSICAL THERAPY KNEE TREATMENT Physical Therapy Progress Note  Dates of reporting period  08/20/22   to   09/20/22   Patient Name: Joanne Lewis MRN: [id number] DOB:1945/11/18, 77 y.o., female Today's Date: 09/20/2022  END OF SESSION:  PT End of Session - 09/20/22 1229     Visit Number 10    Number of Visits 25    Date for PT Re-Evaluation 11/12/22    PT Start Time 1017    PT Stop Time 1110    PT Time Calculation (min) 53 min    Activity Tolerance Patient tolerated treatment well    Behavior During Therapy [facility] for tasks assessed/performed             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HCC)    Arthritis    Branch retinal artery occlusion, left eye    Chronic kidney disease    moderate   Fatigue    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 11/25/2017   Hyperkalemia    Hyperlipidemia    Hypertension    Pneumonia    at least 10 years ago   Pre-diabetes    Patient Active Problem List   Diagnosis Date Noted   Osteoarthritis of right knee 08/16/2022   Osteoarthritis of knee 08/13/2020   OA (osteoarthritis) of knee 08/11/2020   Primary osteoarthritis of left knee 08/11/2020   SOB (shortness of breath) 10/10/2017   Coronary artery calcification 09/19/2016   Chest pain 02/03/2016   Elevated troponin 02/03/2016   Anxiety 02/03/2016   Essential hypertension 02/03/2016   Mixed hyperlipidemia 02/03/2016   Chest tightness 01/01/2016    PCP: Lauro Regulus, MD  REFERRING PROVIDER: Lauro Regulus, MD  REFERRING DIAG: s/p R TKA  RATIONALE FOR EVALUATION AND TREATMENT: Rehabilitation  THERAPY DIAG: Total knee replacement status, right  Muscle weakness (generalized)  Stiffness of right knee, not elsewhere classified  Gait difficulty  Knee joint stiffness, bilateral  ONSET DATE: 08/16/2022  FOLLOW-UP APPT SCHEDULED WITH REFERRING PROVIDER: Yes    SUBJECTIVE:                                                                                                                                                                                          SUBJECTIVE STATEMENT:  s/p R TKR  PERTINENT HISTORY:  Joanne Lewis, 77 y.o. female has a history of pain and functional disability in the right knee due to arthritis and has failed non-surgical conservative treatments for greater than 12 weeks to include corticosteriod injections, viscosupplementation injections, and activity modification. She reports to physical therapy today  s/p right TKA on 08/16/2022. "Since the surgery, I've felt well except for yesterday. I woke up with significant pain in my right leg (8/10) and it kept me from walking or performing any exercise." Pt also complains of severe swelling in RLE. Pt reports slight pain in hip right hip flexor with exercises prescribed from hospital.   PAIN:   Pain Intensity: Present: 0/10, Best: 0/10, Worst: 8/10 Pain location: Right Knee Pain quality: intermittent, incisional   Radiating pain: No  Swelling: Yes  Popping, catching, locking: No  Numbness/Tingling: No Focal weakness or buckling: No Aggravating factors: Post-Op Exercises, Straight Leg Raise  Relieving factors: Resting, Ice  24-hour pain behavior: AM: Sharp Pain PM: Movement Reduces the pain throughout the day History of prior back, hip, or knee injury, pain, surgery, or therapy: Yes Left TKA Dominant hand: right Imaging: Yes  Prior level of function: Independent with household mobility with device Two-Wheeled Walker  Occupational demands: Retired  Presenter, broadcasting: Social research officer, government, Walking on [employer] flags: Negative for personal history of cancer, chills/fever, and denies night sweats, nausea, vomiting, unexplained weight gain/loss, unrelenting pain  PRECAUTIONS: Knee  WEIGHT BEARING RESTRICTIONS: No  FALLS: Has patient fallen in last 6 months? No  Living Environment Lives with: lives alone Lives in:  House/apartment Stairs: 13 Steps Left Rail going up    Patient Goals: "I would like to get stronger, build endurance and return to gardening."    OBJECTIVE:   Patient Surveys  FOTO 2, predicted to 71  Cognition Patient is oriented to person, place, and time.  Recent memory is intact.  Remote memory is intact.  Attention span and concentration are intact.  Expressive speech is intact.  Patient's fund of knowledge is within normal limits for educational level.    Gross Musculoskeletal Assessment Tremor: None Tone: Normal Severe swelling noted in R thigh and calf compared to LLE. PT measured lower extremity swelling 10cm inferior/superior to patella: R Inf: 43cm, L Inf: 37.5, R Sup: 59cm,  L Sup: 53cm.   GAIT: Distance walked: 6m  Assistive device utilized: Environmental consultant - 2 wheeled Level of assistance: Modified independence Comments: Patient presents with antalgic step through gait pattern. Decreased knee flexion during swing.  Posture:  AROM AROM (Normal range in degrees) AROM  Hip Right Left  Flexion (125) WNL WNL  Extension (15)    Abduction (40)    Adduction     Internal Rotation (45)    External Rotation (45)        Knee    Flexion (135) 96* 121  Extension (0) -14* (lacking) 8      Ankle    Dorsiflexion (20) WNL WNL  Plantarflexion (50) WNL WNL  Inversion (35) WNL WNL  Eversion (15 WNL   (* = pain; Blank rows = not tested)  LE MMT: MMT (out of 5) Right Left  Hip flexion    Hip extension    Hip abduction    Hip adduction    Hip internal rotation    Hip external rotation    Knee flexion    Knee extension    Ankle dorsiflexion 5 5  Ankle plantarflexion 5 5  Ankle inversion    Ankle eversion    (* = pain; Blank rows = not tested)  Sensation Grossly intact to light touch bilateral LEs as determined by testing dermatomes L2-S2. Proprioception, and hot/cold testing deferred on this date.  Reflexes Deferred  Muscle  Length Deferred  Palpation Location LEFT  RIGHT  Quadriceps 0 0  Medial Hamstrings 0 2  Lateral Hamstrings 0 1  Lateral Hamstring tendon  1  Medial Hamstring tendon  1  Quadriceps tendon    Patella    Patellar Tendon    Tibial Tuberosity    Medial joint line    Lateral joint line    MCL    LCL    Adductor Tubercle    Pes Anserine tendon    Infrapatellar fat pad    Fibular head    Popliteal fossa 0 1  (Blank rows = not tested) Graded on 0-4 scale (0 = no pain, 1 = pain, 2 = pain with wincing/grimacing/flinching, 3 = pain with withdrawal, 4 = unwilling to allow palpation), (Blank rows = not tested)  Passive Accessory Motion Tibiofemoral: Deferred  Fibula on Femur: Deferred  Patellofemoral: Deferred  VASCULAR Dorsalis pedis and posterior tibial pulses are palpable  WELL Criteria for DVT:  4 Points - High Risk Factor for "Likely" DVT  Calf Swelling Greater than 3 cm compared to contralateral limb Localized Tenderness along the the deep venous system Entire Leg Swollen  Major Swelling within 12 weeks  8/16: Supine R knee AROM (-12 to 74 deg.) Supine R knee PROM (-8 to 88 deg.).   8/29:  5xSTS: 13.6 sec./ 12.82 sec.    TODAY'S TREATMENT: 09/20/2022  Subjective:  Pt reports R knee pain over last several days has been "not too bad"; she also says she has been active and doing her exercises.   Objective:  Therex:  Nustep seat 7-5 L4 for 10 min. (Focus on R knee in midline to increase flexion)- B LE only with average of 50+ SPM.    Resisted Gait in // bars: black TB, x8 each direction (fwd/back/lateral)  Step downs from 6 inch step: 2x15 R leg on step  TRX squats: 2x20 with chair underneath for tactile cue for squat depth   Manual:  Supine R hip/knee AA/PROM 5x with static holds (as tolerated)  Seated MET to increase R knee flexion 5x 5 sec. Holds with OP.  Pain tolerable.  Tender with light palpation over incision/ medial and lateral aspects of  knee.    Supine R hamstring/ gastroc gentle stretches 3x each with holds.     Not today: STS from gray chair with 8# blue ball 10x2 (good squat technique).   High marching/ backwards walking (focus on knee extension) and lateral walking 4 laps each.  Tandem gait forward/ backwards.   Walking lunges in //-bars (4 laps). Supine Grade II-III R patellar mobilizations: superior/inferior + medial/lateral - x5 minutes (increase patellar mobility today due to less swelling in joint).  Instructed in scar massage with lotion to proximal aspects of healed incision.    PATIENT EDUCATION:  Education details: Plan of Care, HEP, Post-op Care  Person educated: Patient Education method: Explanation Education comprehension: verbalized understanding   HOME EXERCISE PROGRAM:  Ankle Pumps - 7x a week - 1 Daily - 1-2 sets - 10 reps Straight Leg Raise - 7x a week - 1 Daily - 1-2 sets - 10 reps  Access Code: [email or web address] URL: [email or web address] Date: 08/27/2022 Prepared by: Dorene Grebe  Exercises - Active Straight Leg Raise with Quad Set  - 2 x daily - 7 x weekly - 2 sets - 10 reps - Supine Quad Set  - 2 x daily - 7 x weekly - 2 sets - 10 reps - Supine Heel Slide with Strap  - 2 x daily - 7 x weekly -  2 sets - 10 reps - Seated Long Arc Quad  - 2 x daily - 7 x weekly - 2 sets - 10 reps - Seated Knee Flexion Extension AAROM with Overpressure  - 2 x daily - 7 x weekly - 1 sets - 10 reps   ASSESSMENT:  CLINICAL IMPRESSION: Pt reports to PT with reports of mild R knee pain over the last several days. She demonstrates improved reciprocal gait pattern in the clinic with more consistent heel strike; pt is not using any AD now. Session today consisted of exercises aimed to improve Pt's R knee ROM as well as her quad/hamstring/hip strength. Pt still demonstrates limited R knee flexion ROM; to be assessed again next session. Pt will benefit from continued PT services upon discharge to safely  address deficits listed in patient problem list for decreased caregiver assistance and eventual return to PLOF.   OBJECTIVE IMPAIRMENTS: Abnormal gait, decreased balance, decreased endurance, difficulty walking, decreased ROM, decreased strength, and pain.   ACTIVITY LIMITATIONS: bending, sitting, standing, squatting, and transfers  PARTICIPATION LIMITATIONS: community activity and yard work  PERSONAL FACTORS: Age, Past/current experiences, and 3+ comorbidities: CKD, Hyperkalemia, HLD, HTN , PAN  are also affecting patient's functional outcome.   REHAB POTENTIAL: Good  CLINICAL DECISION MAKING: Stable/uncomplicated  EVALUATION COMPLEXITY: Moderate   GOALS: Goals reviewed with patient? Yes  SHORT TERM GOALS: Target date: 10/15/2022  Pt will be independent with HEP to improve strength and decrease knee pain to improve pain-free function at home and work. Baseline:  Goal status: Goal met   LONG TERM GOALS: Target date: 11/12/2022  Pt will increase FOTO to at least 66 to demonstrate significant improvement in function at home and work related to knee pain  Baseline: 08/20/22: 53 Goal status: Ongoing  2.  Pt will decrease worst knee pain by at least 3 points on the NPRS in order to demonstrate clinically significant reduction in knee pain. Baseline: 08/09: 8/10 Goal status: Partially met  3. Pt will be able to perform deep squat without pain in order to demonstrate improvement in function as she returns to gardening. Baseline:  Goal status: Partially me  4. Pt will be able to achieve less than 5 degrees of knee extension and at least 115 degrees of knee flexion in order to normalize gait pattern and improve functional mobility.  Baseline: 08/09: -14 to 96 Goal status: Not met  PLAN: PT FREQUENCY: 1-2x/week  PT DURATION: 12 weeks  PLANNED INTERVENTIONS: Therapeutic exercises, Therapeutic activity, Neuromuscular re-education, Balance training, Gait training, Patient/Family  education, Self Care, Joint mobilization, Joint manipulation, Vestibular training, Canalith repositioning, Orthotic/Fit training, DME instructions, Dry Needling, Electrical stimulation, Spinal manipulation, Spinal mobilization, Cryotherapy, Moist heat, Taping, Traction, Ultrasound, Ionotophoresis 4mg /ml Dexamethasone, Manual therapy, and Re-evaluation.  PLAN FOR NEXT SESSION: Progress R knee PROM/AROM exercises as tolerated, progress R LE strengthening as tolerated.  Check knee AROM.    Cammie Mcgee, PT, DPT # [id number] Cena Benton, SPT 09/20/22, 12:36 PM

## 2022-09-21 ENCOUNTER — Encounter: Payer: Medicare Other | Admitting: Physical Therapy

## 2022-09-22 ENCOUNTER — Encounter: Payer: Self-pay | Admitting: Physical Therapy

## 2022-09-22 ENCOUNTER — Ambulatory Visit: Payer: Medicare Other | Admitting: Physical Therapy

## 2022-09-22 DIAGNOSIS — M25661 Stiffness of right knee, not elsewhere classified: Secondary | ICD-10-CM

## 2022-09-22 DIAGNOSIS — R269 Unspecified abnormalities of gait and mobility: Secondary | ICD-10-CM

## 2022-09-22 DIAGNOSIS — Z96651 Presence of right artificial knee joint: Secondary | ICD-10-CM

## 2022-09-22 DIAGNOSIS — M6281 Muscle weakness (generalized): Secondary | ICD-10-CM

## 2022-09-22 NOTE — Therapy (Signed)
OUTPATIENT PHYSICAL THERAPY KNEE TREATMENT  Patient Name: Joanne Lewis MRN: [id number] DOB:14-Dec-1945, 77 y.o., female Today's Date: 09/22/2022  END OF SESSION:  PT End of Session - 09/22/22 1118     Visit Number 11    Number of Visits 25    Date for PT Re-Evaluation 11/12/22    PT Start Time 1025    PT Stop Time 1117    PT Time Calculation (min) 52 min    Activity Tolerance Patient tolerated treatment well    Behavior During Therapy [facility] for tasks assessed/performed              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HCC)    Arthritis    Branch retinal artery occlusion, left eye    Chronic kidney disease    moderate   Fatigue    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 11/25/2017   Hyperkalemia    Hyperlipidemia    Hypertension    Pneumonia    at least 10 years ago   Pre-diabetes    Patient Active Problem List   Diagnosis Date Noted   Osteoarthritis of right knee 08/16/2022   Osteoarthritis of knee 08/13/2020   OA (osteoarthritis) of knee 08/11/2020   Primary osteoarthritis of left knee 08/11/2020   SOB (shortness of breath) 10/10/2017   Coronary artery calcification 09/19/2016   Chest pain 02/03/2016   Elevated troponin 02/03/2016   Anxiety 02/03/2016   Essential hypertension 02/03/2016   Mixed hyperlipidemia 02/03/2016   Chest tightness 01/01/2016    PCP: Joanne Regulus, MD  REFERRING PROVIDER: Lauro Regulus, MD  REFERRING DIAG: s/p R TKA  RATIONALE FOR EVALUATION AND TREATMENT: Rehabilitation  THERAPY DIAG: Total knee replacement status, right  Muscle weakness (generalized)  Stiffness of right knee, not elsewhere classified  Gait difficulty  ONSET DATE: 08/16/2022  FOLLOW-UP APPT SCHEDULED WITH REFERRING PROVIDER: Yes    SUBJECTIVE:                                                                                                                                                                                          SUBJECTIVE STATEMENT:  s/p R TKR  PERTINENT HISTORY:  Joanne Lewis, 77 y.o. female has a history of pain and functional disability in the right knee due to arthritis and has failed non-surgical conservative treatments for greater than 12 weeks to include corticosteriod injections, viscosupplementation injections, and activity modification. She reports to physical therapy today s/p right TKA on 08/16/2022. "Since the surgery, I've felt well except for yesterday. I woke up with significant pain in my  right leg (8/10) and it kept me from walking or performing any exercise." Pt also complains of severe swelling in RLE. Pt reports slight pain in hip right hip flexor with exercises prescribed from hospital.   PAIN:   Pain Intensity: Present: 0/10, Best: 0/10, Worst: 8/10 Pain location: Right Knee Pain quality: intermittent, incisional   Radiating pain: No  Swelling: Yes  Popping, catching, locking: No  Numbness/Tingling: No Focal weakness or buckling: No Aggravating factors: Post-Op Exercises, Straight Leg Raise  Relieving factors: Resting, Ice  24-hour pain behavior: AM: Sharp Pain PM: Movement Reduces the pain throughout the day History of prior back, hip, or knee injury, pain, surgery, or therapy: Yes Left TKA Dominant hand: right Imaging: Yes  Prior level of function: Independent with household mobility with device Two-Wheeled Walker  Occupational demands: Retired  Presenter, broadcasting: Social research officer, government, Walking on [employer] flags: Negative for personal history of cancer, chills/fever, and denies night sweats, nausea, vomiting, unexplained weight gain/loss, unrelenting pain  PRECAUTIONS: Knee  WEIGHT BEARING RESTRICTIONS: No  FALLS: Has patient fallen in last 6 months? No  Living Environment Lives with: lives alone Lives in: House/apartment Stairs: 13 Steps Left Rail going up    Patient Goals: "I would like to get stronger, build endurance and  return to gardening."    OBJECTIVE:   Patient Surveys  FOTO 60, predicted to 12  Cognition Patient is oriented to person, place, and time.  Recent memory is intact.  Remote memory is intact.  Attention span and concentration are intact.  Expressive speech is intact.  Patient's fund of knowledge is within normal limits for educational level.    Gross Musculoskeletal Assessment Tremor: None Tone: Normal Severe swelling noted in R thigh and calf compared to LLE. PT measured lower extremity swelling 10cm inferior/superior to patella: R Inf: 43cm, L Inf: 37.5, R Sup: 59cm,  L Sup: 53cm.   GAIT: Distance walked: 56m  Assistive device utilized: Environmental consultant - 2 wheeled Level of assistance: Modified independence Comments: Patient presents with antalgic step through gait pattern. Decreased knee flexion during swing.  Posture:  AROM AROM (Normal range in degrees) AROM  Hip Right Left  Flexion (125) WNL WNL  Extension (15)    Abduction (40)    Adduction     Internal Rotation (45)    External Rotation (45)        Knee    Flexion (135) 96* 121  Extension (0) -14* (lacking) 8      Ankle    Dorsiflexion (20) WNL WNL  Plantarflexion (50) WNL WNL  Inversion (35) WNL WNL  Eversion (15 WNL   (* = pain; Blank rows = not tested)  LE MMT: MMT (out of 5) Right Left  Hip flexion    Hip extension    Hip abduction    Hip adduction    Hip internal rotation    Hip external rotation    Knee flexion    Knee extension    Ankle dorsiflexion 5 5  Ankle plantarflexion 5 5  Ankle inversion    Ankle eversion    (* = pain; Blank rows = not tested)  Sensation Grossly intact to light touch bilateral LEs as determined by testing dermatomes L2-S2. Proprioception, and hot/cold testing deferred on this date.  Reflexes Deferred  Muscle Length Deferred  Palpation Location LEFT  RIGHT           Quadriceps 0 0  Medial Hamstrings 0 2  Lateral Hamstrings 0 1  Lateral Hamstring tendon  1   Medial Hamstring tendon  1  Quadriceps tendon    Patella    Patellar Tendon    Tibial Tuberosity    Medial joint line    Lateral joint line    MCL    LCL    Adductor Tubercle    Pes Anserine tendon    Infrapatellar fat pad    Fibular head    Popliteal fossa 0 1  (Blank rows = not tested) Graded on 0-4 scale (0 = no pain, 1 = pain, 2 = pain with wincing/grimacing/flinching, 3 = pain with withdrawal, 4 = unwilling to allow palpation), (Blank rows = not tested)  Passive Accessory Motion Tibiofemoral: Deferred  Fibula on Femur: Deferred  Patellofemoral: Deferred  VASCULAR Dorsalis pedis and posterior tibial pulses are palpable  WELL Criteria for DVT:  4 Points - High Risk Factor for "Likely" DVT  Calf Swelling Greater than 3 cm compared to contralateral limb Localized Tenderness along the the deep venous system Entire Leg Swollen  Major Swelling within 12 weeks  8/16: Supine R knee AROM (-12 to 74 deg.) Supine R knee PROM (-8 to 88 deg.).   8/29:  5xSTS: 13.6 sec./ 12.82 sec.    TODAY'S TREATMENT: 09/22/2022  Subjective:  Pt reports no R knee pain today however says it feels a little more stiff today since she wasn't able to warm-up before she came.  Pt. Has returned to driving independently with no limitations.    Objective:  Therex:  Nustep seat 7-5 L4 for 10 min. (Focus on R knee in midline to increase flexion)- B LE only with average of 50+ SPM.    Step downs from 6 inch step: 2x15 R leg on step  STS from plinth with 8# blue ball 10x2 (good squat technique).    Resisted sidesteps with green TB around knees: 4 laps, minimal UE support for balance  High knees and lateral walking in // bars:  4# AW, 3 laps each  Hooklying bridge with large bolster underneath knees: 2x20 - increased R knee pain without bolster  Seated MET to increase R knee flexion 5x 5 sec. Holds with OP.  Pain tolerable.  Tender with light palpation over incision/ medial and lateral  aspects of knee - R knee AROM at end of session 7-110 degrees  Supine R hamstring/ gastroc gentle stretches 3x each with holds.    PATIENT EDUCATION:  Education details: Plan of Care, HEP, Post-op Care  Person educated: Patient Education method: Explanation Education comprehension: verbalized understanding   HOME EXERCISE PROGRAM:  Ankle Pumps - 7x a week - 1 Daily - 1-2 sets - 10 reps Straight Leg Raise - 7x a week - 1 Daily - 1-2 sets - 10 reps  Access Code: [email or web address] URL: [email or web address] Date: 08/27/2022 Prepared by: Dorene Grebe  Exercises - Active Straight Leg Raise with Quad Set  - 2 x daily - 7 x weekly - 2 sets - 10 reps - Supine Quad Set  - 2 x daily - 7 x weekly - 2 sets - 10 reps - Supine Heel Slide with Strap  - 2 x daily - 7 x weekly - 2 sets - 10 reps - Seated Long Arc Quad  - 2 x daily - 7 x weekly - 2 sets - 10 reps - Seated Knee Flexion Extension AAROM with Overpressure  - 2 x daily - 7 x weekly - 1 sets - 10 reps   ASSESSMENT:  CLINICAL IMPRESSION: Pt reports to PT with  reports of mild R knee pain. Pt continues to demonstrate improvements in reciprocal gait pattern when ambulating throughout clinic; she is back to walking with no AD. Session today consisted of hip, quad, and hamstring strengthening exercises. Pt tolerated all exercises well with minimal pain; she experienced the most R knee pain with supine bridges, symptoms improved when placing a bolster underneath her knees. Manual stretching and R knee flexion PROM exercises performed at end of session to continue working on improving R knee ROM. Pt able to get 110 degrees of active R knee flexion at end of session, showing minor improvement from last session. She still demonstrates limitations with achieving full knee extension and will benefit from continued stretching and strengthening exercises to achieve terminal knee extension. Pt will benefit from continued PT services upon discharge to  safely address deficits listed in patient problem list for decreased caregiver assistance and eventual return to PLOF.   OBJECTIVE IMPAIRMENTS: Abnormal gait, decreased balance, decreased endurance, difficulty walking, decreased ROM, decreased strength, and pain.   ACTIVITY LIMITATIONS: bending, sitting, standing, squatting, and transfers  PARTICIPATION LIMITATIONS: community activity and yard work  PERSONAL FACTORS: Age, Past/current experiences, and 3+ comorbidities: CKD, Hyperkalemia, HLD, HTN , PAN  are also affecting patient's functional outcome.   REHAB POTENTIAL: Good  CLINICAL DECISION MAKING: Stable/uncomplicated  EVALUATION COMPLEXITY: Moderate   GOALS: Goals reviewed with patient? Yes  SHORT TERM GOALS: Target date: 10/15/2022  Pt will be independent with HEP to improve strength and decrease knee pain to improve pain-free function at home and work. Baseline:  Goal status: Goal met   LONG TERM GOALS: Target date: 11/12/2022  Pt will increase FOTO to at least 66 to demonstrate significant improvement in function at home and work related to knee pain  Baseline: 08/20/22: 53 Goal status: Ongoing  2.  Pt will decrease worst knee pain by at least 3 points on the NPRS in order to demonstrate clinically significant reduction in knee pain. Baseline: 08/09: 8/10 Goal status: Partially met  3. Pt will be able to perform deep squat without pain in order to demonstrate improvement in function as she returns to gardening. Baseline:  Goal status: Partially me  4. Pt will be able to achieve less than 5 degrees of knee extension and at least 115 degrees of knee flexion in order to normalize gait pattern and improve functional mobility.  Baseline: 08/09: -14 to 96 Goal status: Not met  PLAN: PT FREQUENCY: 1-2x/week  PT DURATION: 12 weeks  PLANNED INTERVENTIONS: Therapeutic exercises, Therapeutic activity, Neuromuscular re-education, Balance training, Gait training,  Patient/Family education, Self Care, Joint mobilization, Joint manipulation, Vestibular training, Canalith repositioning, Orthotic/Fit training, DME instructions, Dry Needling, Electrical stimulation, Spinal manipulation, Spinal mobilization, Cryotherapy, Moist heat, Taping, Traction, Ultrasound, Ionotophoresis 4mg /ml Dexamethasone, Manual therapy, and Re-evaluation.  PLAN FOR NEXT SESSION: Progress R knee PROM/AROM exercises as tolerated, progress R LE strengthening as tolerated.  Check knee AROM. Knee extension exercises (resisted TKE, quad sets)   Cammie Mcgee, PT, DPT # [phone] Cena Benton, SPT 09/22/22, 12:43 PM

## 2022-09-23 ENCOUNTER — Encounter: Payer: Medicare Other | Admitting: Physical Therapy

## 2022-09-28 ENCOUNTER — Ambulatory Visit: Payer: Medicare Other | Admitting: Physical Therapy

## 2022-09-28 ENCOUNTER — Encounter: Payer: Self-pay | Admitting: Physical Therapy

## 2022-09-28 DIAGNOSIS — Z96651 Presence of right artificial knee joint: Secondary | ICD-10-CM | POA: Diagnosis not present

## 2022-09-28 DIAGNOSIS — M25661 Stiffness of right knee, not elsewhere classified: Secondary | ICD-10-CM

## 2022-09-28 DIAGNOSIS — M6281 Muscle weakness (generalized): Secondary | ICD-10-CM

## 2022-09-28 DIAGNOSIS — R269 Unspecified abnormalities of gait and mobility: Secondary | ICD-10-CM

## 2022-09-28 NOTE — Therapy (Signed)
OUTPATIENT PHYSICAL THERAPY KNEE TREATMENT  Patient Name: Joanne Lewis MRN: [id number] DOB:July 09, 1945, 77 y.o., female Today's Date: 09/28/2022  END OF SESSION:  PT End of Session - 09/28/22 0816     Visit Number 12    Number of Visits 25    Date for PT Re-Evaluation 11/12/22    PT Start Time 0815    PT Stop Time 0905    PT Time Calculation (min) 50 min    Activity Tolerance Patient tolerated treatment well    Behavior During Therapy [facility] for tasks assessed/performed               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HCC)    Arthritis    Branch retinal artery occlusion, left eye    Chronic kidney disease    moderate   Fatigue    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 11/25/2017   Hyperkalemia    Hyperlipidemia    Hypertension    Pneumonia    at least 10 years ago   Pre-diabetes    Patient Active Problem List   Diagnosis Date Noted   Osteoarthritis of right knee 08/16/2022   Osteoarthritis of knee 08/13/2020   OA (osteoarthritis) of knee 08/11/2020   Primary osteoarthritis of left knee 08/11/2020   SOB (shortness of breath) 10/10/2017   Coronary artery calcification 09/19/2016   Chest pain 02/03/2016   Elevated troponin 02/03/2016   Anxiety 02/03/2016   Essential hypertension 02/03/2016   Mixed hyperlipidemia 02/03/2016   Chest tightness 01/01/2016    PCP: Lauro Regulus, MD  REFERRING PROVIDER: Lauro Regulus, MD  REFERRING DIAG: s/p R TKA  RATIONALE FOR EVALUATION AND TREATMENT: Rehabilitation  THERAPY DIAG: Total knee replacement status, right  Muscle weakness (generalized)  Stiffness of right knee, not elsewhere classified  Gait difficulty  Knee joint stiffness, bilateral  ONSET DATE: 08/16/2022  FOLLOW-UP APPT SCHEDULED WITH REFERRING PROVIDER: Yes    SUBJECTIVE:                                                                                                                                                                                          SUBJECTIVE STATEMENT:  s/p R TKR  PERTINENT HISTORY:  Joanne Lewis, 77 y.o. female has a history of pain and functional disability in the right knee due to arthritis and has failed non-surgical conservative treatments for greater than 12 weeks to include corticosteriod injections, viscosupplementation injections, and activity modification. She reports to physical therapy today s/p right TKA on 08/16/2022. "Since the surgery, I've felt well except for yesterday. I woke  up with significant pain in my right leg (8/10) and it kept me from walking or performing any exercise." Pt also complains of severe swelling in RLE. Pt reports slight pain in hip right hip flexor with exercises prescribed from hospital.   PAIN:   Pain Intensity: Present: 0/10, Best: 0/10, Worst: 8/10 Pain location: Right Knee Pain quality: intermittent, incisional   Radiating pain: No  Swelling: Yes  Popping, catching, locking: No  Numbness/Tingling: No Focal weakness or buckling: No Aggravating factors: Post-Op Exercises, Straight Leg Raise  Relieving factors: Resting, Ice  24-hour pain behavior: AM: Sharp Pain PM: Movement Reduces the pain throughout the day History of prior back, hip, or knee injury, pain, surgery, or therapy: Yes Left TKA Dominant hand: right Imaging: Yes  Prior level of function: Independent with household mobility with device Two-Wheeled Walker  Occupational demands: Retired  Presenter, broadcasting: Social research officer, government, Walking on [employer] flags: Negative for personal history of cancer, chills/fever, and denies night sweats, nausea, vomiting, unexplained weight gain/loss, unrelenting pain  PRECAUTIONS: Knee  WEIGHT BEARING RESTRICTIONS: No  FALLS: Has patient fallen in last 6 months? No  Living Environment Lives with: lives alone Lives in: House/apartment Stairs: 13 Steps Left Rail going up    Patient Goals: "I would like to  get stronger, build endurance and return to gardening."    OBJECTIVE:   Patient Surveys  FOTO 75, predicted to 73  Cognition Patient is oriented to person, place, and time.  Recent memory is intact.  Remote memory is intact.  Attention span and concentration are intact.  Expressive speech is intact.  Patient's fund of knowledge is within normal limits for educational level.    Gross Musculoskeletal Assessment Tremor: None Tone: Normal Severe swelling noted in R thigh and calf compared to LLE. PT measured lower extremity swelling 10cm inferior/superior to patella: R Inf: 43cm, L Inf: 37.5, R Sup: 59cm,  L Sup: 53cm.   GAIT: Distance walked: 70m  Assistive device utilized: Environmental consultant - 2 wheeled Level of assistance: Modified independence Comments: Patient presents with antalgic step through gait pattern. Decreased knee flexion during swing.  Posture:  AROM AROM (Normal range in degrees) AROM  Hip Right Left  Flexion (125) WNL WNL  Extension (15)    Abduction (40)    Adduction     Internal Rotation (45)    External Rotation (45)        Knee    Flexion (135) 96* 121  Extension (0) -14* (lacking) 8      Ankle    Dorsiflexion (20) WNL WNL  Plantarflexion (50) WNL WNL  Inversion (35) WNL WNL  Eversion (15 WNL   (* = pain; Blank rows = not tested)  LE MMT: MMT (out of 5) Right Left  Hip flexion    Hip extension    Hip abduction    Hip adduction    Hip internal rotation    Hip external rotation    Knee flexion    Knee extension    Ankle dorsiflexion 5 5  Ankle plantarflexion 5 5  Ankle inversion    Ankle eversion    (* = pain; Blank rows = not tested)  Sensation Grossly intact to light touch bilateral LEs as determined by testing dermatomes L2-S2. Proprioception, and hot/cold testing deferred on this date.  Reflexes Deferred  Muscle Length Deferred  Palpation Location LEFT  RIGHT           Quadriceps 0 0  Medial Hamstrings 0 2  Lateral Hamstrings 0  1   Lateral Hamstring tendon  1  Medial Hamstring tendon  1  Quadriceps tendon    Patella    Patellar Tendon    Tibial Tuberosity    Medial joint line    Lateral joint line    MCL    LCL    Adductor Tubercle    Pes Anserine tendon    Infrapatellar fat pad    Fibular head    Popliteal fossa 0 1  (Blank rows = not tested) Graded on 0-4 scale (0 = no pain, 1 = pain, 2 = pain with wincing/grimacing/flinching, 3 = pain with withdrawal, 4 = unwilling to allow palpation), (Blank rows = not tested)  Passive Accessory Motion Tibiofemoral: Deferred  Fibula on Femur: Deferred  Patellofemoral: Deferred  VASCULAR Dorsalis pedis and posterior tibial pulses are palpable  WELL Criteria for DVT:  4 Points - High Risk Factor for "Likely" DVT  Calf Swelling Greater than 3 cm compared to contralateral limb Localized Tenderness along the the deep venous system Entire Leg Swollen  Major Swelling within 12 weeks  8/16: Supine R knee AROM (-12 to 74 deg.) Supine R knee PROM (-8 to 88 deg.).   8/29:  5xSTS: 13.6 sec./ 12.82 sec.    TODAY'S TREATMENT: 09/28/2022  Subjective:  Pt reports usual R knee symptoms, however says she feels her kneecap is a little more tender. She says HEP is still going well.   Objective:  Therex:  Nustep seat 7-5 L4 for 10 min. (Focus on R knee in midline to increase flexion)- B LE only with average of 50+ SPM.    Standing TKE: 2 sets of 1 minute bouts on R LE  Manual tx.:  Seated MET to increase R knee flexion 10x 5 sec. Holds with OP.  Pain tolerable.  Tender with light palpation over incision/ medial and lateral aspects of knee  Supine Heel Slides: x25 with R knee flexion AROM measurement on last set (7-110 degrees)  Seated Knee Flexion stretch holds at end of session - x15 reps, 95 degrees R knee flexion AROM    Not today: Step downs from 6 inch step: 2x15 R leg on step STS from plinth with 8# blue ball 10x2 (good squat technique).   Resisted  sidesteps with green TB around knees: 4 laps, minimal UE support for balance High knees and lateral walking in // bars:  4# AW, 3 laps each Hooklying bridge with large bolster underneath knees: 2x20 - increased R knee pain without bolster  PATIENT EDUCATION:  Education details: Plan of Care, HEP, Post-op Care  Person educated: Patient Education method: Explanation Education comprehension: verbalized understanding   HOME EXERCISE PROGRAM:  Ankle Pumps - 7x a week - 1 Daily - 1-2 sets - 10 reps Straight Leg Raise - 7x a week - 1 Daily - 1-2 sets - 10 reps  Access Code: [email or web address] URL: [email or web address] Date: 08/27/2022 Prepared by: Dorene Grebe  Exercises - Active Straight Leg Raise with Quad Set  - 2 x daily - 7 x weekly - 2 sets - 10 reps - Supine Quad Set  - 2 x daily - 7 x weekly - 2 sets - 10 reps - Supine Heel Slide with Strap  - 2 x daily - 7 x weekly - 2 sets - 10 reps - Seated Long Arc Quad  - 2 x daily - 7 x weekly - 2 sets - 10 reps - Seated Knee Flexion Extension AAROM with Overpressure  - 2 x daily -  7 x weekly - 1 sets - 10 reps   ASSESSMENT:  CLINICAL IMPRESSION: Pt reports to PT with reports of mild R knee pain. Session today consisted of manual stretching and exercises aimed to improve pt's R knee extension. She still demonstrates limitations with achieving full knee extension and will benefit from continued stretching and strengthening exercises to achieve terminal knee extension. Pt maintained same R knee AROM measurements as last session, 7-110 degrees in supine. Pt continues to tolerate all strengthening exercises with no significant increases in R knee pain, just occasional reports of mild discomfort and tightness. Pt will benefit from continued PT services upon discharge to safely address deficits listed in patient problem list for decreased caregiver assistance and eventual return to PLOF.   OBJECTIVE IMPAIRMENTS: Abnormal gait, decreased  balance, decreased endurance, difficulty walking, decreased ROM, decreased strength, and pain.   ACTIVITY LIMITATIONS: bending, sitting, standing, squatting, and transfers  PARTICIPATION LIMITATIONS: community activity and yard work  PERSONAL FACTORS: Age, Past/current experiences, and 3+ comorbidities: CKD, Hyperkalemia, HLD, HTN , PAN  are also affecting patient's functional outcome.   REHAB POTENTIAL: Good  CLINICAL DECISION MAKING: Stable/uncomplicated  EVALUATION COMPLEXITY: Moderate   GOALS: Goals reviewed with patient? Yes  SHORT TERM GOALS: Target date: 10/15/2022  Pt will be independent with HEP to improve strength and decrease knee pain to improve pain-free function at home and work. Baseline:  Goal status: Goal met   LONG TERM GOALS: Target date: 11/12/2022  Pt will increase FOTO to at least 66 to demonstrate significant improvement in function at home and work related to knee pain  Baseline: 08/20/22: 53 Goal status: Ongoing  2.  Pt will decrease worst knee pain by at least 3 points on the NPRS in order to demonstrate clinically significant reduction in knee pain. Baseline: 08/09: 8/10 Goal status: Partially met  3. Pt will be able to perform deep squat without pain in order to demonstrate improvement in function as she returns to gardening. Baseline:  Goal status: Partially me  4. Pt will be able to achieve less than 5 degrees of knee extension and at least 115 degrees of knee flexion in order to normalize gait pattern and improve functional mobility.  Baseline: 08/09: -14 to 96 Goal status: Not met  PLAN: PT FREQUENCY: 1-2x/week  PT DURATION: 12 weeks  PLANNED INTERVENTIONS: Therapeutic exercises, Therapeutic activity, Neuromuscular re-education, Balance training, Gait training, Patient/Family education, Self Care, Joint mobilization, Joint manipulation, Vestibular training, Canalith repositioning, Orthotic/Fit training, DME instructions, Dry Needling,  Electrical stimulation, Spinal manipulation, Spinal mobilization, Cryotherapy, Moist heat, Taping, Traction, Ultrasound, Ionotophoresis 4mg /ml Dexamethasone, Manual therapy, and Re-evaluation.  PLAN FOR NEXT SESSION: Progress R knee PROM/AROM exercises as tolerated, progress R LE strengthening as tolerated.  Discuss MD f/u  Cammie Mcgee, PT, DPT # [id number] Joanne Lewis, SPT 09/28/22, 10:36 AM

## 2022-09-30 ENCOUNTER — Encounter: Payer: Self-pay | Admitting: Physical Therapy

## 2022-09-30 ENCOUNTER — Ambulatory Visit: Payer: Medicare Other | Admitting: Physical Therapy

## 2022-09-30 DIAGNOSIS — Z96651 Presence of right artificial knee joint: Secondary | ICD-10-CM | POA: Diagnosis not present

## 2022-09-30 DIAGNOSIS — R269 Unspecified abnormalities of gait and mobility: Secondary | ICD-10-CM

## 2022-09-30 DIAGNOSIS — M6281 Muscle weakness (generalized): Secondary | ICD-10-CM

## 2022-09-30 DIAGNOSIS — M25661 Stiffness of right knee, not elsewhere classified: Secondary | ICD-10-CM

## 2022-09-30 NOTE — Therapy (Signed)
OUTPATIENT PHYSICAL THERAPY KNEE TREATMENT  Patient Name: SHAWNTIA SANTELL MRN: [id number] DOB:04/09/1945, 77 y.o., female Today's Date: 09/30/2022  END OF SESSION:  PT End of Session - 09/30/22 0820     Visit Number 13    Number of Visits 25    Date for PT Re-Evaluation 11/12/22    PT Start Time 0810    PT Stop Time 0905    PT Time Calculation (min) 55 min    Activity Tolerance Patient tolerated treatment well    Behavior During Therapy [facility] for tasks assessed/performed                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HCC)    Arthritis    Branch retinal artery occlusion, left eye    Chronic kidney disease    moderate   Fatigue    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 11/25/2017   Hyperkalemia    Hyperlipidemia    Hypertension    Pneumonia    at least 10 years ago   Pre-diabetes    Patient Active Problem List   Diagnosis Date Noted   Osteoarthritis of right knee 08/16/2022   Osteoarthritis of knee 08/13/2020   OA (osteoarthritis) of knee 08/11/2020   Primary osteoarthritis of left knee 08/11/2020   SOB (shortness of breath) 10/10/2017   Coronary artery calcification 09/19/2016   Chest pain 02/03/2016   Elevated troponin 02/03/2016   Anxiety 02/03/2016   Essential hypertension 02/03/2016   Mixed hyperlipidemia 02/03/2016   Chest tightness 01/01/2016    PCP: Lauro Regulus, MD  REFERRING PROVIDER: Lauro Regulus, MD  REFERRING DIAG: s/p R TKA  RATIONALE FOR EVALUATION AND TREATMENT: Rehabilitation  THERAPY DIAG: Total knee replacement status, right  Muscle weakness (generalized)  Stiffness of right knee, not elsewhere classified  Gait difficulty  ONSET DATE: 08/16/2022  FOLLOW-UP APPT SCHEDULED WITH REFERRING PROVIDER: Yes    SUBJECTIVE:                                                                                                                                                                                          SUBJECTIVE STATEMENT:  s/p R TKR  PERTINENT HISTORY:  Francisco M Ege, 77 y.o. female has a history of pain and functional disability in the right knee due to arthritis and has failed non-surgical conservative treatments for greater than 12 weeks to include corticosteriod injections, viscosupplementation injections, and activity modification. She reports to physical therapy today s/p right TKA on 08/16/2022. "Since the surgery, I've felt well except for yesterday. I woke up with significant pain  in my right leg (8/10) and it kept me from walking or performing any exercise." Pt also complains of severe swelling in RLE. Pt reports slight pain in hip right hip flexor with exercises prescribed from hospital.   PAIN:   Pain Intensity: Present: 0/10, Best: 0/10, Worst: 8/10 Pain location: Right Knee Pain quality: intermittent, incisional   Radiating pain: No  Swelling: Yes  Popping, catching, locking: No  Numbness/Tingling: No Focal weakness or buckling: No Aggravating factors: Post-Op Exercises, Straight Leg Raise  Relieving factors: Resting, Ice  24-hour pain behavior: AM: Sharp Pain PM: Movement Reduces the pain throughout the day History of prior back, hip, or knee injury, pain, surgery, or therapy: Yes Left TKA Dominant hand: right Imaging: Yes  Prior level of function: Independent with household mobility with device Two-Wheeled Walker  Occupational demands: Retired  Presenter, broadcasting: Social research officer, government, Walking on [employer] flags: Negative for personal history of cancer, chills/fever, and denies night sweats, nausea, vomiting, unexplained weight gain/loss, unrelenting pain  PRECAUTIONS: Knee  WEIGHT BEARING RESTRICTIONS: No  FALLS: Has patient fallen in last 6 months? No  Living Environment Lives with: lives alone Lives in: House/apartment Stairs: 13 Steps Left Rail going up    Patient Goals: "I would like to get stronger, build endurance and  return to gardening."    OBJECTIVE:   Patient Surveys  FOTO 50, predicted to 64  Cognition Patient is oriented to person, place, and time.  Recent memory is intact.  Remote memory is intact.  Attention span and concentration are intact.  Expressive speech is intact.  Patient's fund of knowledge is within normal limits for educational level.    Gross Musculoskeletal Assessment Tremor: None Tone: Normal Severe swelling noted in R thigh and calf compared to LLE. PT measured lower extremity swelling 10cm inferior/superior to patella: R Inf: 43cm, L Inf: 37.5, R Sup: 59cm,  L Sup: 53cm.   GAIT: Distance walked: 37m  Assistive device utilized: Environmental consultant - 2 wheeled Level of assistance: Modified independence Comments: Patient presents with antalgic step through gait pattern. Decreased knee flexion during swing.  Posture:  AROM AROM (Normal range in degrees) AROM  Hip Right Left  Flexion (125) WNL WNL  Extension (15)    Abduction (40)    Adduction     Internal Rotation (45)    External Rotation (45)        Knee    Flexion (135) 96* 121  Extension (0) -14* (lacking) 8      Ankle    Dorsiflexion (20) WNL WNL  Plantarflexion (50) WNL WNL  Inversion (35) WNL WNL  Eversion (15 WNL   (* = pain; Blank rows = not tested)  LE MMT: MMT (out of 5) Right Left  Hip flexion    Hip extension    Hip abduction    Hip adduction    Hip internal rotation    Hip external rotation    Knee flexion    Knee extension    Ankle dorsiflexion 5 5  Ankle plantarflexion 5 5  Ankle inversion    Ankle eversion    (* = pain; Blank rows = not tested)  Sensation Grossly intact to light touch bilateral LEs as determined by testing dermatomes L2-S2. Proprioception, and hot/cold testing deferred on this date.  Reflexes Deferred  Muscle Length Deferred  Palpation Location LEFT  RIGHT           Quadriceps 0 0  Medial Hamstrings 0 2  Lateral Hamstrings 0 1  Lateral Hamstring  tendon  1   Medial Hamstring tendon  1  Quadriceps tendon    Patella    Patellar Tendon    Tibial Tuberosity    Medial joint line    Lateral joint line    MCL    LCL    Adductor Tubercle    Pes Anserine tendon    Infrapatellar fat pad    Fibular head    Popliteal fossa 0 1  (Blank rows = not tested) Graded on 0-4 scale (0 = no pain, 1 = pain, 2 = pain with wincing/grimacing/flinching, 3 = pain with withdrawal, 4 = unwilling to allow palpation), (Blank rows = not tested)  Passive Accessory Motion Tibiofemoral: Deferred  Fibula on Femur: Deferred  Patellofemoral: Deferred  VASCULAR Dorsalis pedis and posterior tibial pulses are palpable  WELL Criteria for DVT:  4 Points - High Risk Factor for "Likely" DVT  Calf Swelling Greater than 3 cm compared to contralateral limb Localized Tenderness along the the deep venous system Entire Leg Swollen  Major Swelling within 12 weeks  8/16: Supine R knee AROM (-12 to 74 deg.) Supine R knee PROM (-8 to 88 deg.).   8/29:  5xSTS: 13.6 sec./ 12.82 sec.    TODAY'S TREATMENT: 09/30/2022  Subjective:  Pt reports no pain right now, she says it hurt a little when she woke up but she took tramadol and it feels better. She says her doctor's visit went well on Tuesday; they were able to get 115 degrees of passive flexion on her R knee.   Objective:  Therex:  Nustep seat 7-5 L4 for 10 min. (Focus on R knee in midline to increase flexion)- B LE only with average of 50+ SPM.    Standing R knee flexion stretch at 12 inch step: 2 x 1 minute holds  Standing TKE: 2 sets of 1 minute bouts on R LE  Step downs: 1x15 R leg on 6-inch step, 1x15 on 3 inch step - regressed to 3 inches secondary to increased R lateral knee pain with first set; pt requires verbal cues to use R quad as opposed to hands  Total Gym Squats: 1x15 each of feet in neutral, ER, IR positioning  Manual tx.: Generalized LE stretching at end of session (hamstrings, glutes,  piriformis): x 5 minutes  Seated MET to increase R knee flexion 10x 5 sec. Holds with OP.  Pain tolerable.  Tender with light palpation over incision/ medial and lateral aspects of knee    PATIENT EDUCATION:  Education details: Plan of Care, HEP, Post-op Care  Person educated: Patient Education method: Explanation Education comprehension: verbalized understanding   HOME EXERCISE PROGRAM:  Ankle Pumps - 7x a week - 1 Daily - 1-2 sets - 10 reps Straight Leg Raise - 7x a week - 1 Daily - 1-2 sets - 10 reps  Access Code: [email or web address] URL: [email or web address] Date: 08/27/2022 Prepared by: Dorene Grebe  Exercises - Active Straight Leg Raise with Quad Set  - 2 x daily - 7 x weekly - 2 sets - 10 reps - Supine Quad Set  - 2 x daily - 7 x weekly - 2 sets - 10 reps - Supine Heel Slide with Strap  - 2 x daily - 7 x weekly - 2 sets - 10 reps - Seated Long Arc Quad  - 2 x daily - 7 x weekly - 2 sets - 10 reps - Seated Knee Flexion Extension AAROM with Overpressure  - 2 x daily - 7 x weekly -  1 sets - 10 reps   ASSESSMENT:  CLINICAL IMPRESSION: Pt reports to PT with reports of no R knee pain. Today's session consisted of exercises aimed to keep improving her R knee extension AROM. Total gym squats trialed with pt today to improve quad strength; pt tolerated them well and reported no pain throughout the exercise. She experienced increased R knee pain when doing step-downs from a 6-inch step; pain was improved but still there (2-3/10) when performing them from a 3-inch step. Manual stretching of hamstrings, glutes, piriformis performed at end to decrease pain and continue improving pt's knee AROM. Pt will benefit from continued PT services upon discharge to safely address deficits listed in patient problem list for decreased caregiver assistance and eventual return to PLOF.   OBJECTIVE IMPAIRMENTS: Abnormal gait, decreased balance, decreased endurance, difficulty walking, decreased  ROM, decreased strength, and pain.   ACTIVITY LIMITATIONS: bending, sitting, standing, squatting, and transfers  PARTICIPATION LIMITATIONS: community activity and yard work  PERSONAL FACTORS: Age, Past/current experiences, and 3+ comorbidities: CKD, Hyperkalemia, HLD, HTN , PAN  are also affecting patient's functional outcome.   REHAB POTENTIAL: Good  CLINICAL DECISION MAKING: Stable/uncomplicated  EVALUATION COMPLEXITY: Moderate   GOALS: Goals reviewed with patient? Yes  SHORT TERM GOALS: Target date: 10/15/2022  Pt will be independent with HEP to improve strength and decrease knee pain to improve pain-free function at home and work. Baseline: independent Goal status: Goal met   LONG TERM GOALS: Target date: 11/12/2022  Pt will increase FOTO to at least 66 to demonstrate significant improvement in function at home and work related to knee pain  Baseline: 08/20/22: 53 Goal status: Ongoing  2.  Pt will decrease worst knee pain by at least 3 points on the NPRS in order to demonstrate clinically significant reduction in knee pain. Baseline: 08/09: 8/10 Goal status: Partially met  3. Pt will be able to perform deep squat without pain in order to demonstrate improvement in function as she returns to gardening. Baseline:  Goal status: Partially me  4. Pt will be able to achieve less than 5 degrees of knee extension and at least 115 degrees of knee flexion in order to normalize gait pattern and improve functional mobility.  Baseline: 08/09: -14 to 96 Goal status: Not met  PLAN: PT FREQUENCY: 1-2x/week  PT DURATION: 12 weeks  PLANNED INTERVENTIONS: Therapeutic exercises, Therapeutic activity, Neuromuscular re-education, Balance training, Gait training, Patient/Family education, Self Care, Joint mobilization, Joint manipulation, Vestibular training, Canalith repositioning, Orthotic/Fit training, DME instructions, Dry Needling, Electrical stimulation, Spinal manipulation, Spinal  mobilization, Cryotherapy, Moist heat, Taping, Traction, Ultrasound, Ionotophoresis 4mg /ml Dexamethasone, Manual therapy, and Re-evaluation.  PLAN FOR NEXT SESSION: Progress R knee PROM/AROM exercises as tolerated, progress R LE strengthening as tolerated.    Cammie Mcgee, PT, DPT # [phone] Cena Benton, SPT 09/30/22, 11:31 AM

## 2022-10-05 ENCOUNTER — Encounter: Payer: Self-pay | Admitting: Physical Therapy

## 2022-10-05 ENCOUNTER — Ambulatory Visit: Payer: Medicare Other | Admitting: Physical Therapy

## 2022-10-05 DIAGNOSIS — Z96651 Presence of right artificial knee joint: Secondary | ICD-10-CM | POA: Diagnosis not present

## 2022-10-05 DIAGNOSIS — M6281 Muscle weakness (generalized): Secondary | ICD-10-CM

## 2022-10-05 DIAGNOSIS — R269 Unspecified abnormalities of gait and mobility: Secondary | ICD-10-CM

## 2022-10-05 DIAGNOSIS — M25661 Stiffness of right knee, not elsewhere classified: Secondary | ICD-10-CM

## 2022-10-05 NOTE — Therapy (Signed)
OUTPATIENT PHYSICAL THERAPY KNEE TREATMENT  Patient Name: MARIBEL ELAM MRN: [id number] DOB:October 20, 1945, 77 y.o., female Today's Date: 10/05/2022  END OF SESSION:  PT End of Session - 10/05/22 1352     Visit Number 14    Number of Visits 25    Date for PT Re-Evaluation 11/12/22    PT Start Time 1340    PT Stop Time 1429    PT Time Calculation (min) 49 min    Activity Tolerance Patient tolerated treatment well    Behavior During Therapy [facility] for tasks assessed/performed             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HCC)    Arthritis    Branch retinal artery occlusion, left eye    Chronic kidney disease    moderate   Fatigue    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 11/25/2017   Hyperkalemia    Hyperlipidemia    Hypertension    Pneumonia    at least 10 years ago   Pre-diabetes    Patient Active Problem List   Diagnosis Date Noted   Osteoarthritis of right knee 08/16/2022   Osteoarthritis of knee 08/13/2020   OA (osteoarthritis) of knee 08/11/2020   Primary osteoarthritis of left knee 08/11/2020   SOB (shortness of breath) 10/10/2017   Coronary artery calcification 09/19/2016   Chest pain 02/03/2016   Elevated troponin 02/03/2016   Anxiety 02/03/2016   Essential hypertension 02/03/2016   Mixed hyperlipidemia 02/03/2016   Chest tightness 01/01/2016    PCP: Lauro Regulus, MD  REFERRING PROVIDER: Lauro Regulus, MD  REFERRING DIAG: s/p R TKA  RATIONALE FOR EVALUATION AND TREATMENT: Rehabilitation  THERAPY DIAG: Total knee replacement status, right  Muscle weakness (generalized)  Stiffness of right knee, not elsewhere classified  Gait difficulty  ONSET DATE: 08/16/2022  FOLLOW-UP APPT SCHEDULED WITH REFERRING PROVIDER: Yes    SUBJECTIVE:                                                                                                                                                                                          SUBJECTIVE STATEMENT:  s/p R TKR  PERTINENT HISTORY:  Annalycia M Opie, 77 y.o. female has a history of pain and functional disability in the right knee due to arthritis and has failed non-surgical conservative treatments for greater than 12 weeks to include corticosteriod injections, viscosupplementation injections, and activity modification. She reports to physical therapy today s/p right TKA on 08/16/2022. "Since the surgery, I've felt well except for yesterday. I woke up with significant pain in my right  leg (8/10) and it kept me from walking or performing any exercise." Pt also complains of severe swelling in RLE. Pt reports slight pain in hip right hip flexor with exercises prescribed from hospital.   PAIN:   Pain Intensity: Present: 0/10, Best: 0/10, Worst: 8/10 Pain location: Right Knee Pain quality: intermittent, incisional   Radiating pain: No  Swelling: Yes  Popping, catching, locking: No  Numbness/Tingling: No Focal weakness or buckling: No Aggravating factors: Post-Op Exercises, Straight Leg Raise  Relieving factors: Resting, Ice  24-hour pain behavior: AM: Sharp Pain PM: Movement Reduces the pain throughout the day History of prior back, hip, or knee injury, pain, surgery, or therapy: Yes Left TKA Dominant hand: right Imaging: Yes  Prior level of function: Independent with household mobility with device Two-Wheeled Walker  Occupational demands: Retired  Presenter, broadcasting: Social research officer, government, Walking on [employer] flags: Negative for personal history of cancer, chills/fever, and denies night sweats, nausea, vomiting, unexplained weight gain/loss, unrelenting pain  PRECAUTIONS: Knee  WEIGHT BEARING RESTRICTIONS: No  FALLS: Has patient fallen in last 6 months? No  Living Environment Lives with: lives alone Lives in: House/apartment Stairs: 13 Steps Left Rail going up    Patient Goals: "I would like to get stronger, build endurance and return  to gardening."    OBJECTIVE:   Patient Surveys  FOTO 28, predicted to 30  Cognition Patient is oriented to person, place, and time.  Recent memory is intact.  Remote memory is intact.  Attention span and concentration are intact.  Expressive speech is intact.  Patient's fund of knowledge is within normal limits for educational level.    Gross Musculoskeletal Assessment Tremor: None Tone: Normal Severe swelling noted in R thigh and calf compared to LLE. PT measured lower extremity swelling 10cm inferior/superior to patella: R Inf: 43cm, L Inf: 37.5, R Sup: 59cm,  L Sup: 53cm.   GAIT: Distance walked: 39m  Assistive device utilized: Environmental consultant - 2 wheeled Level of assistance: Modified independence Comments: Patient presents with antalgic step through gait pattern. Decreased knee flexion during swing.  Posture:  AROM AROM (Normal range in degrees) AROM  Hip Right Left  Flexion (125) WNL WNL  Extension (15)    Abduction (40)    Adduction     Internal Rotation (45)    External Rotation (45)        Knee    Flexion (135) 96* 121  Extension (0) -14* (lacking) 8      Ankle    Dorsiflexion (20) WNL WNL  Plantarflexion (50) WNL WNL  Inversion (35) WNL WNL  Eversion (15 WNL   (* = pain; Blank rows = not tested)  LE MMT: MMT (out of 5) Right Left  Hip flexion    Hip extension    Hip abduction    Hip adduction    Hip internal rotation    Hip external rotation    Knee flexion    Knee extension    Ankle dorsiflexion 5 5  Ankle plantarflexion 5 5  Ankle inversion    Ankle eversion    (* = pain; Blank rows = not tested)  Sensation Grossly intact to light touch bilateral LEs as determined by testing dermatomes L2-S2. Proprioception, and hot/cold testing deferred on this date.  Reflexes Deferred  Muscle Length Deferred  Palpation Location LEFT  RIGHT           Quadriceps 0 0  Medial Hamstrings 0 2  Lateral Hamstrings 0 1  Lateral Hamstring tendon  1  Medial  Hamstring tendon  1  Quadriceps tendon    Patella    Patellar Tendon    Tibial Tuberosity    Medial joint line    Lateral joint line    MCL    LCL    Adductor Tubercle    Pes Anserine tendon    Infrapatellar fat pad    Fibular head    Popliteal fossa 0 1  (Blank rows = not tested) Graded on 0-4 scale (0 = no pain, 1 = pain, 2 = pain with wincing/grimacing/flinching, 3 = pain with withdrawal, 4 = unwilling to allow palpation), (Blank rows = not tested)  Passive Accessory Motion Tibiofemoral: Deferred  Fibula on Femur: Deferred  Patellofemoral: Deferred  VASCULAR Dorsalis pedis and posterior tibial pulses are palpable  WELL Criteria for DVT:  4 Points - High Risk Factor for "Likely" DVT  Calf Swelling Greater than 3 cm compared to contralateral limb Localized Tenderness along the the deep venous system Entire Leg Swollen  Major Swelling within 12 weeks  8/16: Supine R knee AROM (-12 to 74 deg.) Supine R knee PROM (-8 to 88 deg.).   8/29:  5xSTS: 13.6 sec./ 12.82 sec.    TODAY'S TREATMENT: 10/05/2022  Subjective:  Pt reports no new complaints and states she has been active with household chores today.    Objective:  Therex:  Nustep L5 for 10+ min. (Focus on R knee in midline to increase flexion).  No c/o pain.    Standing R knee flexion stretch at 12 inch step: 2 x 1 minute holds  Total Gym Squats: 1x15 each of feet in neutral, ER, IR positioning  STS from mat table with Airex under R and L LE: 20x2.  No UE assist.  Minimal discomfort in posterior aspect of R knee with standing.    Step backs/downs on 3" plinth in //-bars.  Focus on slow, eccentric quad control.    Supine MET to increase R knee flexion 10x 5 sec. Holds with OP.  Pain tolerable.  Tender with light palpation over incision/ medial and lateral aspects of knee  Reviewed HEP    PATIENT EDUCATION:  Education details: Plan of Care, HEP, Post-op Care  Person educated: Patient Education  method: Explanation Education comprehension: verbalized understanding   HOME EXERCISE PROGRAM:  Ankle Pumps - 7x a week - 1 Daily - 1-2 sets - 10 reps Straight Leg Raise - 7x a week - 1 Daily - 1-2 sets - 10 reps  Access Code: [email or web address] URL: [email or web address] Date: 08/27/2022 Prepared by: Dorene Grebe  Exercises - Active Straight Leg Raise with Quad Set  - 2 x daily - 7 x weekly - 2 sets - 10 reps - Supine Quad Set  - 2 x daily - 7 x weekly - 2 sets - 10 reps - Supine Heel Slide with Strap  - 2 x daily - 7 x weekly - 2 sets - 10 reps - Seated Long Arc Quad  - 2 x daily - 7 x weekly - 2 sets - 10 reps - Seated Knee Flexion Extension AAROM with Overpressure  - 2 x daily - 7 x weekly - 1 sets - 10 reps   ASSESSMENT:  CLINICAL IMPRESSION: Pt reports to PT with reports of no R knee pain. Today's session consisted of exercises aimed to keep improving her R knee extension AROM. Total gym squats trialed with pt today to improve quad strength; pt tolerated them well and reported no pain throughout the exercise. She  experienced increased R knee pain when doing step-downs from a 6-inch step; pain was improved but still there (2-3/10) when performing them from a 3-inch step. Manual stretching of hamstrings, glutes, piriformis performed at end to decrease pain and continue improving pt's knee AROM. Pt will benefit from continued PT services upon discharge to safely address deficits listed in patient problem list for decreased caregiver assistance and eventual return to PLOF.   OBJECTIVE IMPAIRMENTS: Abnormal gait, decreased balance, decreased endurance, difficulty walking, decreased ROM, decreased strength, and pain.   ACTIVITY LIMITATIONS: bending, sitting, standing, squatting, and transfers  PARTICIPATION LIMITATIONS: community activity and yard work  PERSONAL FACTORS: Age, Past/current experiences, and 3+ comorbidities: CKD, Hyperkalemia, HLD, HTN , PAN  are also affecting  patient's functional outcome.   REHAB POTENTIAL: Good  CLINICAL DECISION MAKING: Stable/uncomplicated  EVALUATION COMPLEXITY: Moderate   GOALS: Goals reviewed with patient? Yes  SHORT TERM GOALS: Target date: 10/15/2022  Pt will be independent with HEP to improve strength and decrease knee pain to improve pain-free function at home and work. Baseline: independent Goal status: Goal met   LONG TERM GOALS: Target date: 11/12/2022  Pt will increase FOTO to at least 66 to demonstrate significant improvement in function at home and work related to knee pain  Baseline: 08/20/22: 53 Goal status: Ongoing  2.  Pt will decrease worst knee pain by at least 3 points on the NPRS in order to demonstrate clinically significant reduction in knee pain. Baseline: 08/09: 8/10 Goal status: Partially met  3. Pt will be able to perform deep squat without pain in order to demonstrate improvement in function as she returns to gardening. Baseline:  Goal status: Partially me  4. Pt will be able to achieve less than 5 degrees of knee extension and at least 115 degrees of knee flexion in order to normalize gait pattern and improve functional mobility.  Baseline: 08/09: -14 to 96 Goal status: Not met  PLAN: PT FREQUENCY: 1-2x/week  PT DURATION: 12 weeks  PLANNED INTERVENTIONS: Therapeutic exercises, Therapeutic activity, Neuromuscular re-education, Balance training, Gait training, Patient/Family education, Self Care, Joint mobilization, Joint manipulation, Vestibular training, Canalith repositioning, Orthotic/Fit training, DME instructions, Dry Needling, Electrical stimulation, Spinal manipulation, Spinal mobilization, Cryotherapy, Moist heat, Taping, Traction, Ultrasound, Ionotophoresis 4mg /ml Dexamethasone, Manual therapy, and Re-evaluation.  PLAN FOR NEXT SESSION: Progress R LE strengthening as tolerated.  Discuss POC and referral to gym/ aquatic based ex.    Cammie Mcgee, PT, DPT # [phone] 10/05/22,  5:03 PM

## 2022-10-07 ENCOUNTER — Encounter: Payer: Self-pay | Admitting: Physical Therapy

## 2022-10-07 ENCOUNTER — Ambulatory Visit: Payer: Medicare Other | Admitting: Physical Therapy

## 2022-10-07 DIAGNOSIS — M25661 Stiffness of right knee, not elsewhere classified: Secondary | ICD-10-CM

## 2022-10-07 DIAGNOSIS — Z96651 Presence of right artificial knee joint: Secondary | ICD-10-CM

## 2022-10-07 DIAGNOSIS — M6281 Muscle weakness (generalized): Secondary | ICD-10-CM

## 2022-10-07 DIAGNOSIS — R269 Unspecified abnormalities of gait and mobility: Secondary | ICD-10-CM

## 2022-10-07 NOTE — Therapy (Signed)
OUTPATIENT PHYSICAL THERAPY KNEE TREATMENT  Patient Name: Joanne Lewis MRN: [id number] DOB:11-Sep-1945, 77 y.o., female Today's Date: 10/07/2022  END OF SESSION:  PT End of Session - 10/07/22 1352     Visit Number 15    Number of Visits 25    Date for PT Re-Evaluation 11/12/22    PT Start Time 1345    PT Stop Time 1433    PT Time Calculation (min) 48 min    Activity Tolerance Patient tolerated treatment well    Behavior During Therapy [facility] for tasks assessed/performed              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HCC)    Arthritis    Branch retinal artery occlusion, left eye    Chronic kidney disease    moderate   Fatigue    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 11/25/2017   Hyperkalemia    Hyperlipidemia    Hypertension    Pneumonia    at least 10 years ago   Pre-diabetes    Patient Active Problem List   Diagnosis Date Noted   Osteoarthritis of right knee 08/16/2022   Osteoarthritis of knee 08/13/2020   OA (osteoarthritis) of knee 08/11/2020   Primary osteoarthritis of left knee 08/11/2020   SOB (shortness of breath) 10/10/2017   Coronary artery calcification 09/19/2016   Chest pain 02/03/2016   Elevated troponin 02/03/2016   Anxiety 02/03/2016   Essential hypertension 02/03/2016   Mixed hyperlipidemia 02/03/2016   Chest tightness 01/01/2016    PCP: Lauro Regulus, MD  REFERRING PROVIDER: Lauro Regulus, MD  REFERRING DIAG: s/p R TKA  RATIONALE FOR EVALUATION AND TREATMENT: Rehabilitation  THERAPY DIAG: Total knee replacement status, right  Muscle weakness (generalized)  Stiffness of right knee, not elsewhere classified  Gait difficulty  Knee joint stiffness, bilateral  ONSET DATE: 08/16/2022  FOLLOW-UP APPT SCHEDULED WITH REFERRING PROVIDER: Yes    SUBJECTIVE:                                                                                                                                                                                          SUBJECTIVE STATEMENT:  s/p R TKR  PERTINENT HISTORY:  Joanne Lewis, 77 y.o. female has a history of pain and functional disability in the right knee due to arthritis and has failed non-surgical conservative treatments for greater than 12 weeks to include corticosteriod injections, viscosupplementation injections, and activity modification. She reports to physical therapy today s/p right TKA on 08/16/2022. "Since the surgery, I've felt well except for yesterday. I woke up  with significant pain in my right leg (8/10) and it kept me from walking or performing any exercise." Pt also complains of severe swelling in RLE. Pt reports slight pain in hip right hip flexor with exercises prescribed from hospital.   PAIN:   Pain Intensity: Present: 0/10, Best: 0/10, Worst: 8/10 Pain location: Right Knee Pain quality: intermittent, incisional   Radiating pain: No  Swelling: Yes  Popping, catching, locking: No  Numbness/Tingling: No Focal weakness or buckling: No Aggravating factors: Post-Op Exercises, Straight Leg Raise  Relieving factors: Resting, Ice  24-hour pain behavior: AM: Sharp Pain PM: Movement Reduces the pain throughout the day History of prior back, hip, or knee injury, pain, surgery, or therapy: Yes Left TKA Dominant hand: right Imaging: Yes  Prior level of function: Independent with household mobility with device Two-Wheeled Walker  Occupational demands: Retired  Presenter, broadcasting: Social research officer, government, Walking on [employer] flags: Negative for personal history of cancer, chills/fever, and denies night sweats, nausea, vomiting, unexplained weight gain/loss, unrelenting pain  PRECAUTIONS: Knee  WEIGHT BEARING RESTRICTIONS: No  FALLS: Has patient fallen in last 6 months? No  Living Environment Lives with: lives alone Lives in: House/apartment Stairs: 13 Steps Left Rail going up    Patient Goals: "I would like to get  stronger, build endurance and return to gardening."    OBJECTIVE:   Patient Surveys  FOTO 38, predicted to 67  Cognition Patient is oriented to person, place, and time.  Recent memory is intact.  Remote memory is intact.  Attention span and concentration are intact.  Expressive speech is intact.  Patient's fund of knowledge is within normal limits for educational level.    Gross Musculoskeletal Assessment Tremor: None Tone: Normal Severe swelling noted in R thigh and calf compared to LLE. PT measured lower extremity swelling 10cm inferior/superior to patella: R Inf: 43cm, L Inf: 37.5, R Sup: 59cm,  L Sup: 53cm.   GAIT: Distance walked: 30m  Assistive device utilized: Environmental consultant - 2 wheeled Level of assistance: Modified independence Comments: Patient presents with antalgic step through gait pattern. Decreased knee flexion during swing.  Posture:  AROM AROM (Normal range in degrees) AROM  Hip Right Left  Flexion (125) WNL WNL  Extension (15)    Abduction (40)    Adduction     Internal Rotation (45)    External Rotation (45)        Knee    Flexion (135) 96* 121  Extension (0) -14* (lacking) 8      Ankle    Dorsiflexion (20) WNL WNL  Plantarflexion (50) WNL WNL  Inversion (35) WNL WNL  Eversion (15 WNL   (* = pain; Blank rows = not tested)  LE MMT: MMT (out of 5) Right Left  Hip flexion    Hip extension    Hip abduction    Hip adduction    Hip internal rotation    Hip external rotation    Knee flexion    Knee extension    Ankle dorsiflexion 5 5  Ankle plantarflexion 5 5  Ankle inversion    Ankle eversion    (* = pain; Blank rows = not tested)  Sensation Grossly intact to light touch bilateral LEs as determined by testing dermatomes L2-S2. Proprioception, and hot/cold testing deferred on this date.  Reflexes Deferred  Muscle Length Deferred  Palpation Location LEFT  RIGHT           Quadriceps 0 0  Medial Hamstrings 0 2  Lateral Hamstrings 0 1  Lateral Hamstring tendon  1  Medial Hamstring tendon  1  Quadriceps tendon    Patella    Patellar Tendon    Tibial Tuberosity    Medial joint line    Lateral joint line    MCL    LCL    Adductor Tubercle    Pes Anserine tendon    Infrapatellar fat pad    Fibular head    Popliteal fossa 0 1  (Blank rows = not tested) Graded on 0-4 scale (0 = no pain, 1 = pain, 2 = pain with wincing/grimacing/flinching, 3 = pain with withdrawal, 4 = unwilling to allow palpation), (Blank rows = not tested)  Passive Accessory Motion Tibiofemoral: Deferred  Fibula on Femur: Deferred  Patellofemoral: Deferred  VASCULAR Dorsalis pedis and posterior tibial pulses are palpable  WELL Criteria for DVT:  4 Points - High Risk Factor for "Likely" DVT  Calf Swelling Greater than 3 cm compared to contralateral limb Localized Tenderness along the the deep venous system Entire Leg Swollen  Major Swelling within 12 weeks  8/16: Supine R knee AROM (-12 to 74 deg.) Supine R knee PROM (-8 to 88 deg.).   8/29:  5xSTS: 13.6 sec./ 12.82 sec.    TODAY'S TREATMENT: 10/07/2022  Subjective:  Pt reports to PT with reports of increased calf soreness bilaterally from last session. She says doesn't have any R knee pain right now; she took a tylenol before the session.   Objective:  Therex:  Nustep L5 for 10 min. (Focus on R knee in midline to increase flexion).  No c/o pain.    Total Gym Exercises: Squats 1x15 each of feet in neutral, ER, IR positioning / Heel raises 1x20  6-inch step-ups: 1x15 each leg, no UE support    Nautilus Resisted Gait: 60#, x5 each direction (fwd, back, lateral)  Supine Heel slides: x15 reps; 8-109 degrees of R knee AROM at end of session     PATIENT EDUCATION:  Education details: Plan of Care, HEP, Post-op Care  Person educated: Patient Education method: Explanation Education comprehension: verbalized understanding   HOME EXERCISE PROGRAM:  Ankle Pumps - 7x a week  - 1 Daily - 1-2 sets - 10 reps Straight Leg Raise - 7x a week - 1 Daily - 1-2 sets - 10 reps  Access Code: [email or web address] URL: [email or web address] Date: 08/27/2022 Prepared by: Dorene Grebe  Exercises - Active Straight Leg Raise with Quad Set  - 2 x daily - 7 x weekly - 2 sets - 10 reps - Supine Quad Set  - 2 x daily - 7 x weekly - 2 sets - 10 reps - Supine Heel Slide with Strap  - 2 x daily - 7 x weekly - 2 sets - 10 reps - Seated Long Arc Quad  - 2 x daily - 7 x weekly - 2 sets - 10 reps - Seated Knee Flexion Extension AAROM with Overpressure  - 2 x daily - 7 x weekly - 1 sets - 10 reps   ASSESSMENT:  CLINICAL IMPRESSION: Pt reports to PT with reports of no R knee pain, but increased bilateral calf soreness from last session. Today's session consisted of exercises aimed to improve pt's quad, hamstring, and hip strength. Nautilus resisted gait performed to emphasize eccentric control in all planes; pt able to tolerate increase in weight compared to black theraband with no increased R knee pain. Total gym exercises performed again and pt reports no pain just occasional soreness in her calves. Pt's R  knee AROM measured again at end of session; no significant changes noted, pt educated to try to increase her time of keeping her R knee extended with her heel on a towel to promote more extension at home. Pt will benefit from continued PT services upon discharge to safely address deficits listed in patient problem list for decreased caregiver assistance and eventual return to PLOF.   OBJECTIVE IMPAIRMENTS: Abnormal gait, decreased balance, decreased endurance, difficulty walking, decreased ROM, decreased strength, and pain.   ACTIVITY LIMITATIONS: bending, sitting, standing, squatting, and transfers  PARTICIPATION LIMITATIONS: community activity and yard work  PERSONAL FACTORS: Age, Past/current experiences, and 3+ comorbidities: CKD, Hyperkalemia, HLD, HTN , PAN  are also affecting  patient's functional outcome.   REHAB POTENTIAL: Good  CLINICAL DECISION MAKING: Stable/uncomplicated  EVALUATION COMPLEXITY: Moderate   GOALS: Goals reviewed with patient? Yes  SHORT TERM GOALS: Target date: 10/15/2022  Pt will be independent with HEP to improve strength and decrease knee pain to improve pain-free function at home and work. Baseline: independent Goal status: Goal met   LONG TERM GOALS: Target date: 11/12/2022  Pt will increase FOTO to at least 66 to demonstrate significant improvement in function at home and work related to knee pain  Baseline: 08/20/22: 53 Goal status: Ongoing  2.  Pt will decrease worst knee pain by at least 3 points on the NPRS in order to demonstrate clinically significant reduction in knee pain. Baseline: 08/09: 8/10 Goal status: Partially met  3. Pt will be able to perform deep squat without pain in order to demonstrate improvement in function as she returns to gardening. Baseline:  Goal status: Partially me  4. Pt will be able to achieve less than 5 degrees of knee extension and at least 115 degrees of knee flexion in order to normalize gait pattern and improve functional mobility.  Baseline: 08/09: -14 to 96 Goal status: Not met  PLAN: PT FREQUENCY: 1-2x/week  PT DURATION: 12 weeks  PLANNED INTERVENTIONS: Therapeutic exercises, Therapeutic activity, Neuromuscular re-education, Balance training, Gait training, Patient/Family education, Self Care, Joint mobilization, Joint manipulation, Vestibular training, Canalith repositioning, Orthotic/Fit training, DME instructions, Dry Needling, Electrical stimulation, Spinal manipulation, Spinal mobilization, Cryotherapy, Moist heat, Taping, Traction, Ultrasound, Ionotophoresis 4mg /ml Dexamethasone, Manual therapy, and Re-evaluation.  PLAN FOR NEXT SESSION: Progress R LE strengthening as tolerated.  Discuss POC and referral to gym/ aquatic based ex.     Cammie Mcgee, PT, DPT # [phone] Cena Benton, SPT 10/07/22, 2:49 PM

## 2022-10-12 ENCOUNTER — Encounter: Payer: Self-pay | Admitting: Physical Therapy

## 2022-10-12 ENCOUNTER — Ambulatory Visit: Payer: Medicare Other | Attending: Internal Medicine

## 2022-10-12 DIAGNOSIS — M6281 Muscle weakness (generalized): Secondary | ICD-10-CM | POA: Diagnosis present

## 2022-10-12 DIAGNOSIS — M25661 Stiffness of right knee, not elsewhere classified: Secondary | ICD-10-CM | POA: Diagnosis present

## 2022-10-12 DIAGNOSIS — G8929 Other chronic pain: Secondary | ICD-10-CM | POA: Diagnosis present

## 2022-10-12 DIAGNOSIS — M25662 Stiffness of left knee, not elsewhere classified: Secondary | ICD-10-CM | POA: Diagnosis present

## 2022-10-12 DIAGNOSIS — Z96651 Presence of right artificial knee joint: Secondary | ICD-10-CM | POA: Insufficient documentation

## 2022-10-12 DIAGNOSIS — M25561 Pain in right knee: Secondary | ICD-10-CM | POA: Diagnosis present

## 2022-10-12 DIAGNOSIS — R269 Unspecified abnormalities of gait and mobility: Secondary | ICD-10-CM | POA: Diagnosis present

## 2022-10-12 NOTE — Therapy (Signed)
OUTPATIENT PHYSICAL THERAPY KNEE TREATMENT  Patient Name: Joanne Lewis MRN: [id number] DOB:August 17, 1945, 77 y.o., female Today's Date: 10/12/2022  END OF SESSION:  PT End of Session - 10/12/22 1347     Visit Number 16    Number of Visits 25    Date for PT Re-Evaluation 11/12/22    PT Start Time 1345    PT Stop Time 1430    PT Time Calculation (min) 45 min    Activity Tolerance Patient tolerated treatment well    Behavior During Therapy [facility] for tasks assessed/performed               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HCC)    Arthritis    Branch retinal artery occlusion, left eye    Chronic kidney disease    moderate   Fatigue    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 11/25/2017   Hyperkalemia    Hyperlipidemia    Hypertension    Pneumonia    at least 10 years ago   Pre-diabetes    Patient Active Problem List   Diagnosis Date Noted   Osteoarthritis of right knee 08/16/2022   Osteoarthritis of knee 08/13/2020   OA (osteoarthritis) of knee 08/11/2020   Primary osteoarthritis of left knee 08/11/2020   SOB (shortness of breath) 10/10/2017   Coronary artery calcification 09/19/2016   Chest pain 02/03/2016   Elevated troponin 02/03/2016   Anxiety 02/03/2016   Essential hypertension 02/03/2016   Mixed hyperlipidemia 02/03/2016   Chest tightness 01/01/2016    PCP: Lauro Regulus, MD  REFERRING PROVIDER: Lauro Regulus, MD  REFERRING DIAG: s/p R TKA  RATIONALE FOR EVALUATION AND TREATMENT: Rehabilitation  THERAPY DIAG: Total knee replacement status, right  Muscle weakness (generalized)  Stiffness of right knee, not elsewhere classified  Gait difficulty  Knee joint stiffness, bilateral  Chronic pain of right knee  ONSET DATE: 08/16/2022  FOLLOW-UP APPT SCHEDULED WITH REFERRING PROVIDER: Yes    SUBJECTIVE:                                                                                                                                                                                          SUBJECTIVE STATEMENT:  s/p R TKR  PERTINENT HISTORY:  Joanne Lewis, 77 y.o. female has a history of pain and functional disability in the right knee due to arthritis and has failed non-surgical conservative treatments for greater than 12 weeks to include corticosteriod injections, viscosupplementation injections, and activity modification. She reports to physical therapy today s/p right TKA on 08/16/2022. "Since the surgery, I've felt  well except for yesterday. I woke up with significant pain in my right leg (8/10) and it kept me from walking or performing any exercise." Pt also complains of severe swelling in RLE. Pt reports slight pain in hip right hip flexor with exercises prescribed from hospital.   PAIN:   Pain Intensity: Present: 0/10, Best: 0/10, Worst: 8/10 Pain location: Right Knee Pain quality: intermittent, incisional   Radiating pain: No  Swelling: Yes  Popping, catching, locking: No  Numbness/Tingling: No Focal weakness or buckling: No Aggravating factors: Post-Op Exercises, Straight Leg Raise  Relieving factors: Resting, Ice  24-hour pain behavior: AM: Sharp Pain PM: Movement Reduces the pain throughout the day History of prior back, hip, or knee injury, pain, surgery, or therapy: Yes Left TKA Dominant hand: right Imaging: Yes  Prior level of function: Independent with household mobility with device Two-Wheeled Walker  Occupational demands: Retired  Presenter, broadcasting: Social research officer, government, Walking on [employer] flags: Negative for personal history of cancer, chills/fever, and denies night sweats, nausea, vomiting, unexplained weight gain/loss, unrelenting pain  PRECAUTIONS: Knee  WEIGHT BEARING RESTRICTIONS: No  FALLS: Has patient fallen in last 6 months? No  Living Environment Lives with: lives alone Lives in: House/apartment Stairs: 13 Steps Left Rail going up     Patient Goals: "I would like to get stronger, build endurance and return to gardening."    OBJECTIVE:   Patient Surveys  FOTO 67, predicted to 40  Cognition Patient is oriented to person, place, and time.  Recent memory is intact.  Remote memory is intact.  Attention span and concentration are intact.  Expressive speech is intact.  Patient's fund of knowledge is within normal limits for educational level.    Gross Musculoskeletal Assessment Tremor: None Tone: Normal Severe swelling noted in R thigh and calf compared to LLE. PT measured lower extremity swelling 10cm inferior/superior to patella: R Inf: 43cm, L Inf: 37.5, R Sup: 59cm,  L Sup: 53cm.   GAIT: Distance walked: 4m  Assistive device utilized: Environmental consultant - 2 wheeled Level of assistance: Modified independence Comments: Patient presents with antalgic step through gait pattern. Decreased knee flexion during swing.  Posture:  AROM AROM (Normal range in degrees) AROM  Hip Right Left  Flexion (125) WNL WNL  Extension (15)    Abduction (40)    Adduction     Internal Rotation (45)    External Rotation (45)        Knee    Flexion (135) 96* 121  Extension (0) -14* (lacking) 8      Ankle    Dorsiflexion (20) WNL WNL  Plantarflexion (50) WNL WNL  Inversion (35) WNL WNL  Eversion (15 WNL   (* = pain; Blank rows = not tested)  LE MMT: MMT (out of 5) Right Left  Hip flexion    Hip extension    Hip abduction    Hip adduction    Hip internal rotation    Hip external rotation    Knee flexion    Knee extension    Ankle dorsiflexion 5 5  Ankle plantarflexion 5 5  Ankle inversion    Ankle eversion    (* = pain; Blank rows = not tested)  Sensation Grossly intact to light touch bilateral LEs as determined by testing dermatomes L2-S2. Proprioception, and hot/cold testing deferred on this date.  Reflexes Deferred  Muscle Length Deferred  Palpation Location LEFT  RIGHT           Quadriceps 0 0  Medial  Hamstrings 0 2  Lateral Hamstrings 0 1  Lateral Hamstring tendon  1  Medial Hamstring tendon  1  Quadriceps tendon    Patella    Patellar Tendon    Tibial Tuberosity    Medial joint line    Lateral joint line    MCL    LCL    Adductor Tubercle    Pes Anserine tendon    Infrapatellar fat pad    Fibular head    Popliteal fossa 0 1  (Blank rows = not tested) Graded on 0-4 scale (0 = no pain, 1 = pain, 2 = pain with wincing/grimacing/flinching, 3 = pain with withdrawal, 4 = unwilling to allow palpation), (Blank rows = not tested)  Passive Accessory Motion Tibiofemoral: Deferred  Fibula on Femur: Deferred  Patellofemoral: Deferred  VASCULAR Dorsalis pedis and posterior tibial pulses are palpable  WELL Criteria for DVT:  4 Points - High Risk Factor for "Likely" DVT  Calf Swelling Greater than 3 cm compared to contralateral limb Localized Tenderness along the the deep venous system Entire Leg Swollen  Major Swelling within 12 weeks  8/16: Supine R knee AROM (-12 to 74 deg.) Supine R knee PROM (-8 to 88 deg.).   8/29:  5xSTS: 13.6 sec./ 12.82 sec.    TODAY'S TREATMENT: 10/12/2022  Subjective:  Pt reports to PT doing well; she says she forgot to take her tylenol beforehand. No pain right now. She says she would be interested in joining a pool/gym once she is discharged.   Objective:  Therex:  Supine R knee flexion stretch with added hip extension MET: x10 reps - at start of session to improve K flexion ROM  Total Gym Exercises: Squats 1x15 each of feet in neutral, ER, IR positioning / Heel raises 1x20  Seated LAQ: 2x20 on RLE, 5# AW  Nautilus Resisted Gait: 60#, x5 each direction (fwd, back, right, left)   Not today: Supine Heel slides: x15 reps; 8-109 degrees of R knee AROM at end of session 6-inch step-ups: 1x15 each leg, no UE support  PATIENT EDUCATION:  Education details: Plan of Care, HEP, Post-op Care  Person educated: Patient Education method:  Explanation Education comprehension: verbalized understanding   HOME EXERCISE PROGRAM:  Ankle Pumps - 7x a week - 1 Daily - 1-2 sets - 10 reps Straight Leg Raise - 7x a week - 1 Daily - 1-2 sets - 10 reps  Access Code: [email or web address] URL: [email or web address] Date: 08/27/2022 Prepared by: Dorene Grebe  Exercises - Active Straight Leg Raise with Quad Set  - 2 x daily - 7 x weekly - 2 sets - 10 reps - Supine Quad Set  - 2 x daily - 7 x weekly - 2 sets - 10 reps - Supine Heel Slide with Strap  - 2 x daily - 7 x weekly - 2 sets - 10 reps - Seated Long Arc Quad  - 2 x daily - 7 x weekly - 2 sets - 10 reps - Seated Knee Flexion Extension AAROM with Overpressure  - 2 x daily - 7 x weekly - 1 sets - 10 reps   ASSESSMENT:  CLINICAL IMPRESSION: Pt reports to PT with reports of no R knee pain. Today's session started with manual stretching to improve R knee flexion ROM. The rest of the session consisted of exercises aimed to strengthen pt's quads, hamstrings, and hip musculature. Total gym squats performed with varying foot position to challenge vastus lateralis and medialis; pt tolerates them well with no  increase in R knee pain. Pt able to perform part of nautilus resisted gait with increased weight from last session; regressed back to 60# halfway through secondary to pt demonstrating decreased eccentric control. Pt agreeable to planning on discharging from PT next session with plan to join gym/pool and self-manage condition. Pt will benefit from continued PT services upon discharge to safely address deficits listed in patient problem list for decreased caregiver assistance and eventual return to PLOF.   OBJECTIVE IMPAIRMENTS: Abnormal gait, decreased balance, decreased endurance, difficulty walking, decreased ROM, decreased strength, and pain.   ACTIVITY LIMITATIONS: bending, sitting, standing, squatting, and transfers  PARTICIPATION LIMITATIONS: community activity and yard  work  PERSONAL FACTORS: Age, Past/current experiences, and 3+ comorbidities: CKD, Hyperkalemia, HLD, HTN , PAN  are also affecting patient's functional outcome.   REHAB POTENTIAL: Good  CLINICAL DECISION MAKING: Stable/uncomplicated  EVALUATION COMPLEXITY: Moderate   GOALS: Goals reviewed with patient? Yes  SHORT TERM GOALS: Target date: 10/15/2022  Pt will be independent with HEP to improve strength and decrease knee pain to improve pain-free function at home and work. Baseline: independent Goal status: Goal met   LONG TERM GOALS: Target date: 11/12/2022  Pt will increase FOTO to at least 66 to demonstrate significant improvement in function at home and work related to knee pain  Baseline: 08/20/22: 53 Goal status: Ongoing  2.  Pt will decrease worst knee pain by at least 3 points on the NPRS in order to demonstrate clinically significant reduction in knee pain. Baseline: 08/09: 8/10 Goal status: Partially met  3. Pt will be able to perform deep squat without pain in order to demonstrate improvement in function as she returns to gardening. Baseline:  Goal status: Partially me  4. Pt will be able to achieve less than 5 degrees of knee extension and at least 115 degrees of knee flexion in order to normalize gait pattern and improve functional mobility.  Baseline: 08/09: -14 to 96 Goal status: Not met  PLAN: PT FREQUENCY: 1-2x/week  PT DURATION: 12 weeks  PLANNED INTERVENTIONS: Therapeutic exercises, Therapeutic activity, Neuromuscular re-education, Balance training, Gait training, Patient/Family education, Self Care, Joint mobilization, Joint manipulation, Vestibular training, Canalith repositioning, Orthotic/Fit training, DME instructions, Dry Needling, Electrical stimulation, Spinal manipulation, Spinal mobilization, Cryotherapy, Moist heat, Taping, Traction, Ultrasound, Ionotophoresis 4mg /ml Dexamethasone, Manual therapy, and Re-evaluation.  PLAN FOR NEXT SESSION: Progress R  LE strengthening as tolerated.  Discuss POC and referral to gym/ aquatic based ex, NEW HEP, CHECK GOALS, DC    Tykia Mellone, SPT  Maylon Peppers, PT, DPT Physical Therapist - [place]  [place] [facility] 10/12/22, 3:17 PM

## 2022-10-14 ENCOUNTER — Ambulatory Visit: Payer: Medicare Other

## 2022-10-14 ENCOUNTER — Encounter: Payer: Self-pay | Admitting: Physical Therapy

## 2022-10-14 DIAGNOSIS — M25661 Stiffness of right knee, not elsewhere classified: Secondary | ICD-10-CM

## 2022-10-14 DIAGNOSIS — M25662 Stiffness of left knee, not elsewhere classified: Secondary | ICD-10-CM

## 2022-10-14 DIAGNOSIS — M6281 Muscle weakness (generalized): Secondary | ICD-10-CM

## 2022-10-14 DIAGNOSIS — R269 Unspecified abnormalities of gait and mobility: Secondary | ICD-10-CM

## 2022-10-14 DIAGNOSIS — Z96651 Presence of right artificial knee joint: Secondary | ICD-10-CM | POA: Diagnosis not present

## 2022-10-14 NOTE — Therapy (Signed)
OUTPATIENT PHYSICAL THERAPY KNEE TREATMENT/DISCHARGE  Patient Name: Joanne Lewis MRN: [id number] DOB:Dec 03, 1945, 77 y.o., female Today's Date: 10/14/2022  END OF SESSION:  PT End of Session - 10/14/22 1349     Visit Number 17    Number of Visits 25    Date for PT Re-Evaluation 11/12/22    PT Start Time 1345    PT Stop Time 1432    PT Time Calculation (min) 47 min    Activity Tolerance Patient tolerated treatment well    Behavior During Therapy [facility] for tasks assessed/performed                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HCC)    Arthritis    Branch retinal artery occlusion, left eye    Chronic kidney disease    moderate   Fatigue    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 11/25/2017   Hyperkalemia    Hyperlipidemia    Hypertension    Pneumonia    at least 10 years ago   Pre-diabetes    Patient Active Problem List   Diagnosis Date Noted   Osteoarthritis of right knee 08/16/2022   Osteoarthritis of knee 08/13/2020   OA (osteoarthritis) of knee 08/11/2020   Primary osteoarthritis of left knee 08/11/2020   SOB (shortness of breath) 10/10/2017   Coronary artery calcification 09/19/2016   Chest pain 02/03/2016   Elevated troponin 02/03/2016   Anxiety 02/03/2016   Essential hypertension 02/03/2016   Mixed hyperlipidemia 02/03/2016   Chest tightness 01/01/2016    PCP: Lauro Regulus, MD  REFERRING PROVIDER: Lauro Regulus, MD  REFERRING DIAG: s/p R TKA  RATIONALE FOR EVALUATION AND TREATMENT: Rehabilitation  THERAPY DIAG: Total knee replacement status, right  Muscle weakness (generalized)  Stiffness of right knee, not elsewhere classified  Gait difficulty  Knee joint stiffness, bilateral  ONSET DATE: 08/16/2022  FOLLOW-UP APPT SCHEDULED WITH REFERRING PROVIDER: Yes    SUBJECTIVE:                                                                                                                                                                                          SUBJECTIVE STATEMENT:  s/p R TKR  PERTINENT HISTORY:  Joanne Lewis, 77 y.o. female has a history of pain and functional disability in the right knee due to arthritis and has failed non-surgical conservative treatments for greater than 12 weeks to include corticosteriod injections, viscosupplementation injections, and activity modification. She reports to physical therapy today s/p right TKA on 08/16/2022. "Since the surgery, I've felt well except for yesterday. I  woke up with significant pain in my right leg (8/10) and it kept me from walking or performing any exercise." Pt also complains of severe swelling in RLE. Pt reports slight pain in hip right hip flexor with exercises prescribed from hospital.   PAIN:   Pain Intensity: Present: 0/10, Best: 0/10, Worst: 8/10 Pain location: Right Knee Pain quality: intermittent, incisional   Radiating pain: No  Swelling: Yes  Popping, catching, locking: No  Numbness/Tingling: No Focal weakness or buckling: No Aggravating factors: Post-Op Exercises, Straight Leg Raise  Relieving factors: Resting, Ice  24-hour pain behavior: AM: Sharp Pain PM: Movement Reduces the pain throughout the day History of prior back, hip, or knee injury, pain, surgery, or therapy: Yes Left TKA Dominant hand: right Imaging: Yes  Prior level of function: Independent with household mobility with device Two-Wheeled Walker  Occupational demands: Retired  Presenter, broadcasting: Social research officer, government, Walking on [employer] flags: Negative for personal history of cancer, chills/fever, and denies night sweats, nausea, vomiting, unexplained weight gain/loss, unrelenting pain  PRECAUTIONS: Knee  WEIGHT BEARING RESTRICTIONS: No  FALLS: Has patient fallen in last 6 months? No  Living Environment Lives with: lives alone Lives in: House/apartment Stairs: 13 Steps Left Rail going up    Patient Goals: "I  would like to get stronger, build endurance and return to gardening."    OBJECTIVE:   Patient Surveys  FOTO 55, predicted to 49  Cognition Patient is oriented to person, place, and time.  Recent memory is intact.  Remote memory is intact.  Attention span and concentration are intact.  Expressive speech is intact.  Patient's fund of knowledge is within normal limits for educational level.    Gross Musculoskeletal Assessment Tremor: None Tone: Normal Severe swelling noted in R thigh and calf compared to LLE. PT measured lower extremity swelling 10cm inferior/superior to patella: R Inf: 43cm, L Inf: 37.5, R Sup: 59cm,  L Sup: 53cm.   GAIT: Distance walked: 38m  Assistive device utilized: Environmental consultant - 2 wheeled Level of assistance: Modified independence Comments: Patient presents with antalgic step through gait pattern. Decreased knee flexion during swing.  Posture:  AROM AROM (Normal range in degrees) AROM  Hip Right Left  Flexion (125) WNL WNL  Extension (15)    Abduction (40)    Adduction     Internal Rotation (45)    External Rotation (45)        Knee    Flexion (135) 96* 121  Extension (0) -14* (lacking) 8      Ankle    Dorsiflexion (20) WNL WNL  Plantarflexion (50) WNL WNL  Inversion (35) WNL WNL  Eversion (15 WNL   (* = pain; Blank rows = not tested)  LE MMT: MMT (out of 5) Right Left  Hip flexion    Hip extension    Hip abduction    Hip adduction    Hip internal rotation    Hip external rotation    Knee flexion    Knee extension    Ankle dorsiflexion 5 5  Ankle plantarflexion 5 5  Ankle inversion    Ankle eversion    (* = pain; Blank rows = not tested)  Sensation Grossly intact to light touch bilateral LEs as determined by testing dermatomes L2-S2. Proprioception, and hot/cold testing deferred on this date.  Reflexes Deferred  Muscle Length Deferred  Palpation Location LEFT  RIGHT           Quadriceps 0 0  Medial Hamstrings 0 2  Lateral  Hamstrings 0 1  Lateral Hamstring tendon  1  Medial Hamstring tendon  1  Quadriceps tendon    Patella    Patellar Tendon    Tibial Tuberosity    Medial joint line    Lateral joint line    MCL    LCL    Adductor Tubercle    Pes Anserine tendon    Infrapatellar fat pad    Fibular head    Popliteal fossa 0 1  (Blank rows = not tested) Graded on 0-4 scale (0 = no pain, 1 = pain, 2 = pain with wincing/grimacing/flinching, 3 = pain with withdrawal, 4 = unwilling to allow palpation), (Blank rows = not tested)  Passive Accessory Motion Tibiofemoral: Deferred  Fibula on Femur: Deferred  Patellofemoral: Deferred  VASCULAR Dorsalis pedis and posterior tibial pulses are palpable  WELL Criteria for DVT:  4 Points - High Risk Factor for "Likely" DVT  Calf Swelling Greater than 3 cm compared to contralateral limb Localized Tenderness along the the deep venous system Entire Leg Swollen  Major Swelling within 12 weeks  8/16: Supine R knee AROM (-12 to 74 deg.) Supine R knee PROM (-8 to 88 deg.).   8/29:  5xSTS: 13.6 sec./ 12.82 sec.    TODAY'S TREATMENT: 10/14/2022  Subjective:  Pt reports to PT doing well; 1/10 R knee pain right now. She reports she was able to keep her knee extended with a towel under her heel for a little bit longer yesterday compared to over the weekend.   Objective: FOTO: 69  Therex: Heel slides x 10: 9-111 R knee AROM  HEP review and modification   - Sit-to-stands  - 3-way hip with green TB  - Heel slides  - SLR  - Knee extension stretch under towel roll  - Step up (fwd/lateral)    PATIENT EDUCATION:  Education details: Plan of Care, HEP, Post-op Care  Person educated: Patient Education method: Explanation Education comprehension: verbalized understanding   HOME EXERCISE PROGRAM:  Ankle Pumps - 7x a week - 1 Daily - 1-2 sets - 10 reps Straight Leg Raise - 7x a week - 1 Daily - 1-2 sets - 10 reps  Access Code: [email or web address] URL:  [email or web address] Date: 08/27/2022 Prepared by: Dorene Grebe  Exercises - Active Straight Leg Raise with Quad Set  - 2 x daily - 7 x weekly - 2 sets - 10 reps - Supine Quad Set  - 2 x daily - 7 x weekly - 2 sets - 10 reps - Supine Heel Slide with Strap  - 2 x daily - 7 x weekly - 2 sets - 10 reps - Seated Long Arc Quad  - 2 x daily - 7 x weekly - 2 sets - 10 reps - Seated Knee Flexion Extension AAROM with Overpressure  - 2 x daily - 7 x weekly - 1 sets - 10 reps   ASSESSMENT:  CLINICAL IMPRESSION: Pt reports to PT with reports of very mild R knee pain (1/10). Pt presents today for discharge from PT and is agreeable to self-managing symptoms with HEP and water aerobics/gym classes. Goals reassessed today and pt met almost all goals; she demonstrated slight improvements in R knee AROM compared to last session but continues to be the most limited with knee extension. Pt shows great improvements in pain compared to initial evaluation. HEP modified and updated at end of session today; most of today's session consisted of going through the exercises and ensuring proper form/answering any questions the  patient had about the exercises.    OBJECTIVE IMPAIRMENTS: Abnormal gait, decreased balance, decreased endurance, difficulty walking, decreased ROM, decreased strength, and pain.   ACTIVITY LIMITATIONS: bending, sitting, standing, squatting, and transfers  PARTICIPATION LIMITATIONS: community activity and yard work  PERSONAL FACTORS: Age, Past/current experiences, and 3+ comorbidities: CKD, Hyperkalemia, HLD, HTN , PAN  are also affecting patient's functional outcome.   REHAB POTENTIAL: Good  CLINICAL DECISION MAKING: Stable/uncomplicated  EVALUATION COMPLEXITY: Moderate   GOALS: Goals reviewed with patient? Yes  SHORT TERM GOALS: Target date: 10/15/2022  Pt will be independent with HEP to improve strength and decrease knee pain to improve pain-free function at home and  work. Baseline: independent Goal status: Goal met   LONG TERM GOALS: Target date: 11/12/2022  Pt will increase FOTO to at least 66 to demonstrate significant improvement in function at home and work related to knee pain  Baseline: 08/20/22: 53, 10/3: 69 Goal status: GOAL MET (10/3)  2.  Pt will decrease worst knee pain by at least 3 points on the NPRS in order to demonstrate clinically significant reduction in knee pain. Baseline: 08/09: 8/10, 10/3: 1/10 Goal status: GOAL MET (10/3)  3. Pt will be able to perform deep squat without pain in order to demonstrate improvement in function as she returns to gardening. Baseline: Pt reports still struggling with pain with deep squat (10/3) Goal status: Partially met  4. Pt will be able to achieve less than 5 degrees of knee extension and at least 115 degrees of knee flexion in order to normalize gait pattern and improve functional mobility.  Baseline: 08/09: -14 to 96, 10/3: 9 to 111 degrees R knee AROM Goal status: Not met, progressing  PLAN: PT FREQUENCY: 1-2x/week  PT DURATION: 12 weeks  PLANNED INTERVENTIONS: Therapeutic exercises, Therapeutic activity, Neuromuscular re-education, Balance training, Gait training, Patient/Family education, Self Care, Joint mobilization, Joint manipulation, Vestibular training, Canalith repositioning, Orthotic/Fit training, DME instructions, Dry Needling, Electrical stimulation, Spinal manipulation, Spinal mobilization, Cryotherapy, Moist heat, Taping, Traction, Ultrasound, Ionotophoresis 4mg /ml Dexamethasone, Manual therapy, and Re-evaluation.  PLAN FOR NEXT SESSION: D/C from PT    Aritha Huckeba, SPT  Maylon Peppers, PT, DPT Physical Therapist - [facility] 10/14/22, 2:39 PM

## 2022-11-11 ENCOUNTER — Other Ambulatory Visit: Payer: Self-pay | Admitting: Ophthalmology

## 2022-11-11 DIAGNOSIS — G453 Amaurosis fugax: Secondary | ICD-10-CM

## 2022-11-18 ENCOUNTER — Ambulatory Visit
Admission: RE | Admit: 2022-11-18 | Discharge: 2022-11-18 | Disposition: A | Discharge status: Home / Self Care | Payer: Medicare Other | Source: Ambulatory Visit | Attending: Ophthalmology | Admitting: Ophthalmology

## 2022-11-18 DIAGNOSIS — G453 Amaurosis fugax: Secondary | ICD-10-CM | POA: Insufficient documentation

## 2023-03-06 NOTE — Progress Notes (Unsigned)
 Cardiology Office Note  Date:  03/07/2023   ID:  Joanne, Lewis [birth year], MRN [id number]  PCP:  Lauro Regulus, MD   Chief Complaint  Patient presents with   New Patient (Initial Visit)    Ref by Dr. Dareen Piano to establish care for coronary artery calcification & Aortic atherosclerosis. Patient last seen in 2019 by Dr. Mariah Milling. Patient c/o dizziness, shortness of breath, is anxious, hot sensation with hands feeling clammy, toes feeling cold and palpitations at times.     HPI:  [name] is a 78 year old woman with past medical history of Anxiety/stress hypertension,  Hyperlipidemia shortness of breath.  normal stress test, normal echocardiogram. Coronary calcium score of 10 in January 2018 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, started on aldactone Chronic kidney disease stage III Cirrhosis on CT scan November 2019 Who presents for follow-up of her hypertension, TIA, aortic atherosclerosis, coronary calcification  Last seen by myself in clinic October 2019  BP at home, labile Orthostatics positive today, !24 systolic supine down to 95 standing  Oct 2024, busy day moving plants, did not eat much Micah Flesher out to lunch with daughter very brief episode of visual deficit,  Lab work reviewed A1c 5.9 Total cholesterol 140 LDL 74 Normal BMP  Carotid ultrasound, no 2024 nonobstructive disease  CT abdomen pelvis November 2019 Very mild aortic atherosclerosis  Echo January 2025 Performed at outside facility Ejection fraction greater than 55%, grade 2 diastolic dysfunction, normal RV size and function No significant valvular heart disease Unchanged from study January 2018  "2 knee surgeries", knee swelling post surgery  EKG personally reviewed by myself on todays visit EKG Interpretation Date/Time:  Monday March 07 2023 09:45:01 EST Ventricular Rate:  55 PR Interval:  150 QRS Duration:  132 QT Interval:  458 QTC Calculation: 438 R  Axis:   -39  Text Interpretation: Sinus bradycardia Left axis deviation Right bundle branch block When compared with ECG of 03-Aug-2022 10:34, Borderline criteria for Lateral infarct are no longer Present Nonspecific T wave abnormality no longer evident in Lateral leads Confirmed by Julien Nordmann [phone]) on 03/07/2023 9:50:52 AM   PMH:   has a past medical history of 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HCC), Arthritis, Branch retinal artery occlusion, left eye, Chronic kidney disease, Fatigue, 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 (11/25/2017), Hyperkalemia, Hyperlipidemia, Hypertension, Pneumonia, and Pre-diabetes.  PSH:    Past Surgical History:  Procedure Laterality Date   COLONOSCOPY WITH PROPOFOL N/A 08/09/2016   Procedure: COLONOSCOPY WITH PROPOFOL;  Surgeon: Scot Jun, MD;  [facility] ENDOSCOPY;  Service: Endoscopy;  Laterality: N/A;   DILATATION & CURETTAGE/HYSTEROSCOPY WITH MYOSURE N/A 02/20/2018   Procedure: DILATATION & CURETTAGE/HYSTEROSCOPY WITH MYOSURE;  Surgeon: Schermerhorn, Ihor Austin, MD;  Location: [facility] ORS;  Service: Gynecology;  Laterality: N/A;   DILATION AND CURETTAGE OF UTERUS N/A 02/20/2018   Procedure: DILATATION AND CURETTAGE, fractional, resection of endometrial mass, possible myosure, hysteroscopy;  Surgeon: Schermerhorn, Ihor Austin, MD;  Location: [facility] ORS;  Service: Gynecology;  Laterality: N/A;   FEMORAL HERNIA REPAIR Right 11/26/2017   Procedure: HERNIA REPAIR FEMORAL with insertion or mesh;  Surgeon: Ancil Linsey, MD;  Location: [facility] ORS;  Service: General;  Laterality: Right;   HYSTEROSCOPY WITH D & C N/A 02/20/2018   Procedure: DILATATION AND CURETTAGE /HYSTEROSCOPY,;  Surgeon: Schermerhorn, Ihor Austin, MD;  Location: [facility] ORS;  Service: Gynecology;  Laterality: N/A;   TOTAL KNEE ARTHROPLASTY Left 08/11/2020   Procedure: TOTAL KNEE ARTHROPLASTY;  Surgeon:  Ollen Gross, MD;  Location: [facility] ORS;  Service: Orthopedics;  Laterality: Left;    TOTAL KNEE ARTHROPLASTY Right 08/16/2022   Procedure: TOTAL KNEE ARTHROPLASTY;  Surgeon: Ollen Gross, MD;  Location: [facility] ORS;  Service: Orthopedics;  Laterality: Right;    Current Outpatient Medications  Medication Sig Dispense Refill   aspirin EC 81 MG tablet Take 81 mg by mouth daily.     atorvastatin (LIPITOR) 20 MG tablet Take 20 mg by mouth every evening.      carvedilol (COREG) 25 MG tablet Take 25 mg by mouth 2 (two) times daily with a meal.     latanoprost (XALATAN) 0.005 % ophthalmic solution Place 1 drop into both eyes at bedtime.     losartan-hydrochlorothiazide (HYZAAR) 100-12.5 MG tablet Take 1 tablet by mouth daily.     timolol (TIMOPTIC) 0.5 % ophthalmic solution Place 1 drop into both eyes in the morning.     HYDROmorphone (DILAUDID) 2 MG tablet Take 1-1.5 tablets (2-3 mg total) by mouth every 6 (six) hours as needed for severe pain. (Patient not taking: Reported on 03/07/2023) 42 tablet 0   methocarbamol (ROBAXIN) 500 MG tablet Take 1 tablet (500 mg total) by mouth every 6 (six) hours as needed for muscle spasms. (Patient not taking: Reported on 03/07/2023) 40 tablet 0   ondansetron (ZOFRAN) 4 MG tablet Take 1 tablet (4 mg total) by mouth every 6 (six) hours as needed for nausea. (Patient not taking: Reported on 03/07/2023) 20 tablet 0   traMADol (ULTRAM) 50 MG tablet Take 1-2 tablets (50-100 mg total) by mouth every 6 (six) hours as needed for moderate pain. (Patient not taking: Reported on 03/07/2023) 40 tablet 0   No current facility-administered medications for this visit.   Allergies:   Codeine and Other   Social History:  The patient  reports that she has never smoked. She has never used smokeless tobacco. She reports that she does not drink alcohol and does not use drugs.   Family History:   family history includes Heart attack in her father.   Review of Systems: Review of Systems  Constitutional: Negative.   HENT: Negative.    Respiratory: Negative.     Cardiovascular: Negative.   Gastrointestinal: Negative.   Musculoskeletal: Negative.   Neurological: Negative.   Psychiatric/Behavioral: Negative.    All other systems reviewed and are negative.  PHYSICAL EXAM: VS:  BP 100/60 (BP Location: Right Arm, Patient Position: Sitting, Cuff Size: Normal)   Pulse (!) 55   Ht 5\' 3"  (1.6 m)   Wt 180 lb (81.6 kg)   SpO2 98%   BMI 31.89 kg/m  , BMI Body mass index is 31.89 kg/m. GEN: Well nourished, well developed, in no acute distress HEENT: normal Neck: no JVD, carotid bruits, or masses Cardiac: RRR; no murmurs, rubs, or gallops,no edema  Respiratory:  clear to auscultation bilaterally, normal work of breathing GI: soft, nontender, nondistended, + BS MS: no deformity or atrophy Skin: warm and dry, no rash Neuro:  Strength and sensation are intact Psych: euthymic mood, full affect  Recent Labs: 08/17/2022: BUN 16; Creatinine, Ser 0.99; Hemoglobin 10.4; Platelets 136; Potassium 4.9; Sodium 136    Lipid Panel Lab Results  Component Value Date   CHOL 174 01/02/2016   HDL 56 01/02/2016   LDLCALC 106 (H) 01/02/2016   TRIG 60 01/02/2016     Wt Readings from Last 3 Encounters:  03/07/23 180 lb (81.6 kg)  08/16/22 184 lb 8.4 oz (83.7 kg)  08/03/22  185 lb (83.9 kg)     ASSESSMENT AND PLAN:  Problem List Items Addressed This Visit       Cardiology Problems   Coronary artery calcification - Primary   Relevant Medications   losartan-hydrochlorothiazide (HYZAAR) 100-12.5 MG tablet   aspirin EC 81 MG tablet   carvedilol (COREG) 25 MG tablet   Other Relevant Orders   EKG 12-Lead (Completed)   Essential hypertension   Relevant Medications   losartan-hydrochlorothiazide (HYZAAR) 100-12.5 MG tablet   aspirin EC 81 MG tablet   carvedilol (COREG) 25 MG tablet   Other Relevant Orders   EKG 12-Lead (Completed)   Mixed hyperlipidemia   Relevant Medications   losartan-hydrochlorothiazide (HYZAAR) 100-12.5 MG tablet   aspirin EC 81 MG  tablet   carvedilol (COREG) 25 MG tablet     Other   SOB (shortness of breath)   Relevant Orders   EKG 12-Lead (Completed)   Essential hypertension Blood pressure reasonable, bordering on low on today's visit Of orthostatics, recommend she stay hydrated She would likely need to stop the HCTZ in the summer as she leans towards hypovolemia in the summer months.  At that time could split losartan and hydrochlorothiazide For continued orthostatics, could take HCTZ every other day  Coronary calcification Minimal calcifications on prior calcium scoring Cholesterol at goal, Currently with no symptoms of angina. No further workup at this time. Continue current medication regimen.  Anxiety Would likely benefit from medication as needed Contributing to symptoms of sweating, nervousness, hot flashes, comes as episodes.  Less likely cardiac etiology  Aortic atherosclerosis Very mild noted on CT scan, cholesterol at goal, no further workup needed  Long discussion on today's visit concerning various treatment options for her blood pressure Reassurance provided   Signed, Dossie Arbour, M.D., Ph.D. [facility] [place], [place] [phone]

## 2023-03-07 ENCOUNTER — Encounter: Payer: Self-pay | Admitting: Cardiovascular Disease

## 2023-03-07 ENCOUNTER — Ambulatory Visit: Payer: Medicare Other | Attending: Cardiovascular Disease | Admitting: Cardiovascular Disease

## 2023-03-07 VITALS — BP 100/60 | HR 55 | Ht 63.0 in | Wt 180.0 lb

## 2023-03-07 DIAGNOSIS — R0602 Shortness of breath: Secondary | ICD-10-CM | POA: Insufficient documentation

## 2023-03-07 DIAGNOSIS — E782 Mixed hyperlipidemia: Secondary | ICD-10-CM | POA: Diagnosis not present

## 2023-03-07 DIAGNOSIS — I10 Essential (primary) hypertension: Secondary | ICD-10-CM | POA: Insufficient documentation

## 2023-03-07 DIAGNOSIS — I2510 Atherosclerotic heart disease of native coronary artery without angina pectoris: Secondary | ICD-10-CM | POA: Diagnosis not present

## 2023-07-04 ENCOUNTER — Other Ambulatory Visit: Payer: Self-pay | Admitting: Internal Medicine

## 2023-07-04 DIAGNOSIS — Z1231 Encounter for screening mammogram for malignant neoplasm of breast: Secondary | ICD-10-CM

## 2023-07-14 ENCOUNTER — Ambulatory Visit
Admission: RE | Admit: 2023-07-14 | Discharge: 2023-07-14 | Disposition: A | Discharge status: Home / Self Care | Source: Ambulatory Visit | Attending: Internal Medicine | Admitting: Internal Medicine

## 2023-07-14 DIAGNOSIS — Z1231 Encounter for screening mammogram for malignant neoplasm of breast: Secondary | ICD-10-CM | POA: Diagnosis present
